# Patient Record
Sex: Male | Born: 1955
Health system: Southern US, Community
[De-identification: ages and names within clinical notes are randomized; demographics above are authoritative.]

## PROBLEM LIST (undated history)

## (undated) DIAGNOSIS — N529 Male erectile dysfunction, unspecified: Secondary | ICD-10-CM

## (undated) DIAGNOSIS — I2699 Other pulmonary embolism without acute cor pulmonale: Secondary | ICD-10-CM

## (undated) DIAGNOSIS — I4891 Unspecified atrial fibrillation: Secondary | ICD-10-CM

## (undated) DIAGNOSIS — I10 Essential (primary) hypertension: Secondary | ICD-10-CM

## (undated) DIAGNOSIS — F419 Anxiety disorder, unspecified: Secondary | ICD-10-CM

## (undated) DIAGNOSIS — I493 Ventricular premature depolarization: Secondary | ICD-10-CM

## (undated) DIAGNOSIS — I82409 Acute embolism and thrombosis of unspecified deep veins of unspecified lower extremity: Secondary | ICD-10-CM

## (undated) DIAGNOSIS — K219 Gastro-esophageal reflux disease without esophagitis: Secondary | ICD-10-CM

## (undated) HISTORY — DX: Other pulmonary embolism without acute cor pulmonale: I26.99

## (undated) HISTORY — DX: Essential (primary) hypertension: I10

## (undated) HISTORY — PX: TONSILLECTOMY: SUR1361

## (undated) HISTORY — DX: Male erectile dysfunction, unspecified: N52.9

## (undated) HISTORY — PX: VEIN SURGERY: SHX48

## (undated) HISTORY — DX: Anxiety disorder, unspecified: F41.9

## (undated) HISTORY — DX: Acute embolism and thrombosis of unspecified deep veins of unspecified lower extremity: I82.409

## (undated) HISTORY — PX: BREAST SURGERY: SHX581

## (undated) HISTORY — DX: Ventricular premature depolarization: I49.3

## (undated) HISTORY — DX: Gastro-esophageal reflux disease without esophagitis: K21.9

## (undated) HISTORY — DX: Unspecified atrial fibrillation: I48.91

---

## 1999-09-28 ENCOUNTER — Emergency Department (HOSPITAL_COMMUNITY): Admission: EM | Admit: 1999-09-28 | Discharge: 1999-09-28 | Payer: Self-pay | Admitting: Emergency Medicine

## 1999-10-02 ENCOUNTER — Inpatient Hospital Stay (HOSPITAL_COMMUNITY): Admission: EM | Admit: 1999-10-02 | Discharge: 1999-10-08 | Payer: Self-pay | Admitting: Emergency Medicine

## 1999-10-02 ENCOUNTER — Encounter: Payer: Self-pay | Admitting: Emergency Medicine

## 1999-11-23 ENCOUNTER — Ambulatory Visit (HOSPITAL_COMMUNITY): Admission: RE | Admit: 1999-11-23 | Discharge: 1999-11-23 | Payer: Self-pay | Admitting: Internal Medicine

## 1999-11-23 ENCOUNTER — Encounter: Payer: Self-pay | Admitting: Internal Medicine

## 2000-01-20 ENCOUNTER — Emergency Department (HOSPITAL_COMMUNITY): Admission: EM | Admit: 2000-01-20 | Discharge: 2000-01-20 | Payer: Self-pay | Admitting: Emergency Medicine

## 2000-03-28 ENCOUNTER — Encounter: Payer: Self-pay | Admitting: Pulmonary Disease

## 2000-03-28 ENCOUNTER — Ambulatory Visit (HOSPITAL_COMMUNITY): Admission: RE | Admit: 2000-03-28 | Discharge: 2000-03-28 | Payer: Self-pay | Admitting: Pulmonary Disease

## 2000-06-24 ENCOUNTER — Emergency Department (HOSPITAL_COMMUNITY): Admission: EM | Admit: 2000-06-24 | Discharge: 2000-06-24 | Payer: Self-pay | Admitting: Emergency Medicine

## 2000-06-24 ENCOUNTER — Encounter: Payer: Self-pay | Admitting: Emergency Medicine

## 2000-11-18 ENCOUNTER — Encounter: Payer: Self-pay | Admitting: Emergency Medicine

## 2000-11-18 ENCOUNTER — Emergency Department (HOSPITAL_COMMUNITY): Admission: EM | Admit: 2000-11-18 | Discharge: 2000-11-18 | Payer: Self-pay | Admitting: Emergency Medicine

## 2002-02-07 ENCOUNTER — Emergency Department (HOSPITAL_COMMUNITY): Admission: EM | Admit: 2002-02-07 | Discharge: 2002-02-07 | Payer: Self-pay | Admitting: Emergency Medicine

## 2002-02-07 ENCOUNTER — Encounter: Payer: Self-pay | Admitting: Emergency Medicine

## 2002-12-20 ENCOUNTER — Emergency Department (HOSPITAL_COMMUNITY): Admission: EM | Admit: 2002-12-20 | Discharge: 2002-12-20 | Payer: Self-pay | Admitting: Emergency Medicine

## 2002-12-20 ENCOUNTER — Encounter: Payer: Self-pay | Admitting: Emergency Medicine

## 2004-01-05 ENCOUNTER — Emergency Department (HOSPITAL_COMMUNITY): Admission: EM | Admit: 2004-01-05 | Discharge: 2004-01-05 | Payer: Self-pay | Admitting: Emergency Medicine

## 2004-03-29 ENCOUNTER — Ambulatory Visit: Payer: Self-pay | Admitting: Internal Medicine

## 2004-04-09 ENCOUNTER — Ambulatory Visit: Payer: Self-pay

## 2004-05-07 ENCOUNTER — Ambulatory Visit: Payer: Self-pay | Admitting: Cardiovascular Disease

## 2004-06-04 ENCOUNTER — Ambulatory Visit: Payer: Self-pay | Admitting: *Deleted

## 2004-07-02 ENCOUNTER — Ambulatory Visit: Payer: Self-pay | Admitting: Cardiology

## 2004-07-30 ENCOUNTER — Ambulatory Visit: Payer: Self-pay | Admitting: Cardiology

## 2004-08-27 ENCOUNTER — Ambulatory Visit: Payer: Self-pay | Admitting: Cardiology

## 2004-09-24 ENCOUNTER — Ambulatory Visit: Payer: Self-pay | Admitting: *Deleted

## 2004-10-16 ENCOUNTER — Ambulatory Visit: Payer: Self-pay | Admitting: *Deleted

## 2004-11-05 ENCOUNTER — Ambulatory Visit: Payer: Self-pay | Admitting: Cardiology

## 2004-11-19 ENCOUNTER — Ambulatory Visit: Payer: Self-pay | Admitting: Cardiology

## 2004-12-17 ENCOUNTER — Ambulatory Visit: Payer: Self-pay | Admitting: Cardiology

## 2004-12-24 ENCOUNTER — Ambulatory Visit: Payer: Self-pay | Admitting: Cardiology

## 2004-12-26 ENCOUNTER — Ambulatory Visit: Payer: Self-pay | Admitting: Cardiology

## 2004-12-27 ENCOUNTER — Ambulatory Visit: Payer: Self-pay | Admitting: Cardiovascular Disease

## 2004-12-27 ENCOUNTER — Observation Stay (HOSPITAL_COMMUNITY): Admission: EM | Admit: 2004-12-27 | Discharge: 2004-12-28 | Payer: Self-pay | Admitting: Emergency Medicine

## 2004-12-31 ENCOUNTER — Ambulatory Visit: Payer: Self-pay | Admitting: Cardiology

## 2005-01-08 ENCOUNTER — Ambulatory Visit: Payer: Self-pay | Admitting: Internal Medicine

## 2005-01-08 ENCOUNTER — Ambulatory Visit: Payer: Self-pay | Admitting: Cardiology

## 2005-01-28 ENCOUNTER — Ambulatory Visit: Payer: Self-pay | Admitting: Internal Medicine

## 2005-02-11 ENCOUNTER — Ambulatory Visit: Payer: Self-pay | Admitting: Internal Medicine

## 2005-03-11 ENCOUNTER — Ambulatory Visit: Payer: Self-pay | Admitting: Cardiology

## 2005-04-02 ENCOUNTER — Ambulatory Visit: Payer: Self-pay | Admitting: Internal Medicine

## 2005-04-08 ENCOUNTER — Ambulatory Visit: Payer: Self-pay | Admitting: Internal Medicine

## 2005-04-15 ENCOUNTER — Ambulatory Visit: Payer: Self-pay | Admitting: Cardiology

## 2005-04-23 ENCOUNTER — Ambulatory Visit: Payer: Self-pay | Admitting: Gastroenterology

## 2005-05-03 ENCOUNTER — Ambulatory Visit: Payer: Self-pay | Admitting: Cardiology

## 2005-05-31 ENCOUNTER — Ambulatory Visit: Payer: Self-pay | Admitting: Internal Medicine

## 2005-06-27 ENCOUNTER — Ambulatory Visit: Payer: Self-pay | Admitting: Internal Medicine

## 2005-07-25 ENCOUNTER — Ambulatory Visit: Payer: Self-pay | Admitting: Cardiology

## 2005-08-21 ENCOUNTER — Ambulatory Visit: Payer: Self-pay | Admitting: Cardiology

## 2005-09-19 ENCOUNTER — Ambulatory Visit: Payer: Self-pay | Admitting: Cardiology

## 2005-09-23 ENCOUNTER — Ambulatory Visit: Payer: Self-pay | Admitting: Internal Medicine

## 2005-10-17 ENCOUNTER — Ambulatory Visit: Payer: Self-pay | Admitting: Cardiology

## 2005-11-14 ENCOUNTER — Ambulatory Visit: Payer: Self-pay | Admitting: Internal Medicine

## 2005-12-16 ENCOUNTER — Ambulatory Visit: Payer: Self-pay | Admitting: Cardiovascular Disease

## 2005-12-30 ENCOUNTER — Ambulatory Visit: Payer: Self-pay | Admitting: Cardiovascular Disease

## 2006-01-13 ENCOUNTER — Ambulatory Visit: Payer: Self-pay | Admitting: Cardiology

## 2006-02-10 ENCOUNTER — Ambulatory Visit: Payer: Self-pay | Admitting: Cardiology

## 2006-03-08 ENCOUNTER — Emergency Department (HOSPITAL_COMMUNITY): Admission: EM | Admit: 2006-03-08 | Discharge: 2006-03-08 | Payer: Self-pay | Admitting: Emergency Medicine

## 2006-04-02 ENCOUNTER — Ambulatory Visit: Payer: Self-pay | Admitting: Cardiology

## 2006-04-02 ENCOUNTER — Ambulatory Visit: Payer: Self-pay | Admitting: Internal Medicine

## 2006-04-08 ENCOUNTER — Ambulatory Visit: Payer: Self-pay

## 2006-04-23 ENCOUNTER — Ambulatory Visit: Payer: Self-pay | Admitting: Cardiology

## 2006-05-15 ENCOUNTER — Ambulatory Visit: Payer: Self-pay | Admitting: Cardiology

## 2006-05-21 ENCOUNTER — Ambulatory Visit: Payer: Self-pay | Admitting: Internal Medicine

## 2006-05-21 LAB — CONVERTED CEMR LAB
ALT: 32 units/L (ref 0–40)
Albumin: 3.6 g/dL (ref 3.5–5.2)
Alkaline Phosphatase: 66 units/L (ref 39–117)
Basophils Relative: 0.6 % (ref 0.0–1.0)
Chloride: 104 meq/L (ref 96–112)
GFR calc non Af Amer: 75 mL/min
Glomerular Filtration Rate, Af Am: 90 mL/min/{1.73_m2}
Glucose, Bld: 102 mg/dL — ABNORMAL HIGH (ref 70–99)
Leukocytes, UA: NEGATIVE
MCV: 85.3 fL (ref 78.0–100.0)
Monocytes Absolute: 0.4 10*3/uL (ref 0.2–0.7)
Platelets: 244 10*3/uL (ref 150–400)
RBC: 4.71 M/uL (ref 4.22–5.81)
Sodium: 140 meq/L (ref 135–145)
Specific Gravity, Urine: 1.01 (ref 1.000–1.03)
Total Protein, Urine: NEGATIVE mg/dL
Total Protein: 6.9 g/dL (ref 6.0–8.3)
Urobilinogen, UA: 0.2 (ref 0.0–1.0)
VLDL: 61 mg/dL — ABNORMAL HIGH (ref 0–40)
WBC: 5.7 10*3/uL (ref 4.5–10.5)
pH: 6.5 (ref 5.0–8.0)

## 2006-06-05 ENCOUNTER — Ambulatory Visit: Payer: Self-pay | Admitting: *Deleted

## 2006-07-03 ENCOUNTER — Ambulatory Visit: Payer: Self-pay | Admitting: Internal Medicine

## 2006-07-31 ENCOUNTER — Ambulatory Visit: Payer: Self-pay | Admitting: Cardiology

## 2006-08-21 ENCOUNTER — Ambulatory Visit: Payer: Self-pay | Admitting: Cardiology

## 2006-09-09 ENCOUNTER — Ambulatory Visit: Payer: Self-pay | Admitting: Internal Medicine

## 2006-09-09 ENCOUNTER — Emergency Department (HOSPITAL_COMMUNITY): Admission: EM | Admit: 2006-09-09 | Discharge: 2006-09-09 | Payer: Self-pay | Admitting: Emergency Medicine

## 2006-09-11 ENCOUNTER — Ambulatory Visit: Payer: Self-pay | Admitting: Internal Medicine

## 2006-09-18 ENCOUNTER — Ambulatory Visit: Payer: Self-pay | Admitting: Cardiovascular Disease

## 2006-10-16 ENCOUNTER — Ambulatory Visit: Payer: Self-pay | Admitting: *Deleted

## 2006-11-11 ENCOUNTER — Ambulatory Visit: Payer: Self-pay | Admitting: Cardiology

## 2006-12-09 ENCOUNTER — Ambulatory Visit: Payer: Self-pay | Admitting: Internal Medicine

## 2007-01-05 ENCOUNTER — Ambulatory Visit: Payer: Self-pay | Admitting: Cardiology

## 2007-02-04 ENCOUNTER — Ambulatory Visit: Payer: Self-pay | Admitting: Internal Medicine

## 2007-03-03 ENCOUNTER — Ambulatory Visit: Payer: Self-pay | Admitting: Cardiology

## 2007-03-31 ENCOUNTER — Ambulatory Visit: Payer: Self-pay | Admitting: Cardiovascular Disease

## 2007-04-20 ENCOUNTER — Ambulatory Visit: Payer: Self-pay | Admitting: Internal Medicine

## 2007-04-20 DIAGNOSIS — I4891 Unspecified atrial fibrillation: Secondary | ICD-10-CM

## 2007-04-20 DIAGNOSIS — I10 Essential (primary) hypertension: Secondary | ICD-10-CM | POA: Insufficient documentation

## 2007-04-20 DIAGNOSIS — I2699 Other pulmonary embolism without acute cor pulmonale: Secondary | ICD-10-CM

## 2007-04-20 DIAGNOSIS — F418 Other specified anxiety disorders: Secondary | ICD-10-CM | POA: Insufficient documentation

## 2007-04-20 DIAGNOSIS — Z86718 Personal history of other venous thrombosis and embolism: Secondary | ICD-10-CM

## 2007-04-20 DIAGNOSIS — R079 Chest pain, unspecified: Secondary | ICD-10-CM | POA: Insufficient documentation

## 2007-04-20 DIAGNOSIS — K219 Gastro-esophageal reflux disease without esophagitis: Secondary | ICD-10-CM

## 2007-04-20 DIAGNOSIS — I4949 Other premature depolarization: Secondary | ICD-10-CM

## 2007-04-20 DIAGNOSIS — F411 Generalized anxiety disorder: Secondary | ICD-10-CM

## 2007-04-20 DIAGNOSIS — Z8672 Personal history of thrombophlebitis: Secondary | ICD-10-CM

## 2007-04-20 HISTORY — DX: Generalized anxiety disorder: F41.1

## 2007-04-28 ENCOUNTER — Ambulatory Visit: Payer: Self-pay | Admitting: Cardiology

## 2007-05-25 ENCOUNTER — Ambulatory Visit: Payer: Self-pay | Admitting: Cardiology

## 2007-06-18 ENCOUNTER — Ambulatory Visit: Payer: Self-pay | Admitting: Cardiology

## 2007-07-16 ENCOUNTER — Ambulatory Visit: Payer: Self-pay | Admitting: Cardiology

## 2007-08-10 ENCOUNTER — Ambulatory Visit: Payer: Self-pay | Admitting: Cardiology

## 2007-09-07 ENCOUNTER — Ambulatory Visit: Payer: Self-pay | Admitting: Cardiology

## 2007-10-06 ENCOUNTER — Ambulatory Visit: Payer: Self-pay | Admitting: Cardiology

## 2007-11-02 ENCOUNTER — Ambulatory Visit: Payer: Self-pay | Admitting: Internal Medicine

## 2007-11-27 ENCOUNTER — Ambulatory Visit: Payer: Self-pay | Admitting: Cardiology

## 2007-12-24 ENCOUNTER — Ambulatory Visit: Payer: Self-pay | Admitting: Internal Medicine

## 2008-01-21 ENCOUNTER — Ambulatory Visit: Payer: Self-pay | Admitting: Internal Medicine

## 2008-02-18 ENCOUNTER — Ambulatory Visit: Payer: Self-pay | Admitting: Internal Medicine

## 2008-02-29 ENCOUNTER — Emergency Department (HOSPITAL_COMMUNITY): Admission: EM | Admit: 2008-02-29 | Discharge: 2008-02-29 | Payer: Self-pay | Admitting: Emergency Medicine

## 2008-03-07 ENCOUNTER — Ambulatory Visit: Payer: Self-pay | Admitting: Internal Medicine

## 2008-03-07 LAB — CONVERTED CEMR LAB
ALT: 37 units/L (ref 0–53)
AST: 28 units/L (ref 0–37)
Basophils Relative: 0.1 % (ref 0.0–3.0)
Bilirubin, Direct: 0.2 mg/dL (ref 0.0–0.3)
CO2: 30 meq/L (ref 19–32)
Calcium: 9.1 mg/dL (ref 8.4–10.5)
Chloride: 105 meq/L (ref 96–112)
Cholesterol: 207 mg/dL (ref 0–200)
Creatinine, Ser: 1.1 mg/dL (ref 0.4–1.5)
Direct LDL: 95.5 mg/dL
Eosinophils Relative: 3.6 % (ref 0.0–5.0)
Glucose, Bld: 101 mg/dL — ABNORMAL HIGH (ref 70–99)
Hemoglobin: 14.1 g/dL (ref 13.0–17.0)
Ketones, ur: NEGATIVE mg/dL
Leukocytes, UA: NEGATIVE
Lymphocytes Relative: 33.4 % (ref 12.0–46.0)
Monocytes Relative: 5.9 % (ref 3.0–12.0)
Neutro Abs: 2.7 10*3/uL (ref 1.4–7.7)
Nitrite: NEGATIVE
PSA: 1.15 ng/mL (ref 0.10–4.00)
RBC: 4.57 M/uL (ref 4.22–5.81)
Specific Gravity, Urine: <=1.005 (ref 1.000–1.03)
Total CHOL/HDL Ratio: 6.5
Total Protein: 6.6 g/dL (ref 6.0–8.3)
WBC: 4.8 10*3/uL (ref 4.5–10.5)
pH: 7 (ref 5.0–8.0)

## 2008-03-11 ENCOUNTER — Ambulatory Visit: Payer: Self-pay | Admitting: Internal Medicine

## 2008-03-17 ENCOUNTER — Ambulatory Visit: Payer: Self-pay | Admitting: Cardiovascular Disease

## 2008-04-11 ENCOUNTER — Ambulatory Visit: Payer: Self-pay | Admitting: Internal Medicine

## 2008-05-03 ENCOUNTER — Ambulatory Visit: Payer: Self-pay | Admitting: Cardiology

## 2008-05-03 ENCOUNTER — Observation Stay (HOSPITAL_COMMUNITY): Admission: EM | Admit: 2008-05-03 | Discharge: 2008-05-04 | Payer: Self-pay | Admitting: Cardiovascular Disease

## 2008-05-03 ENCOUNTER — Ambulatory Visit: Payer: Self-pay | Admitting: Diagnostic Radiology

## 2008-05-03 ENCOUNTER — Encounter: Payer: Self-pay | Admitting: Emergency Medicine

## 2008-05-09 ENCOUNTER — Ambulatory Visit: Payer: Self-pay

## 2008-05-09 ENCOUNTER — Ambulatory Visit: Payer: Self-pay | Admitting: Cardiology

## 2008-06-06 ENCOUNTER — Ambulatory Visit: Payer: Self-pay | Admitting: Cardiology

## 2008-07-04 ENCOUNTER — Ambulatory Visit: Payer: Self-pay | Admitting: Internal Medicine

## 2008-08-01 ENCOUNTER — Ambulatory Visit: Payer: Self-pay | Admitting: Cardiology

## 2008-08-29 ENCOUNTER — Ambulatory Visit: Payer: Self-pay | Admitting: Internal Medicine

## 2008-09-26 ENCOUNTER — Ambulatory Visit: Payer: Self-pay | Admitting: Cardiovascular Disease

## 2008-10-18 ENCOUNTER — Encounter: Payer: Self-pay | Admitting: *Deleted

## 2008-10-24 ENCOUNTER — Ambulatory Visit: Payer: Self-pay | Admitting: Cardiovascular Disease

## 2008-11-14 ENCOUNTER — Ambulatory Visit: Payer: Self-pay | Admitting: Cardiology

## 2008-11-14 LAB — CONVERTED CEMR LAB: Prothrombin Time: 20.5 s

## 2008-11-23 ENCOUNTER — Encounter: Payer: Self-pay | Admitting: *Deleted

## 2008-12-13 ENCOUNTER — Ambulatory Visit: Payer: Self-pay | Admitting: Internal Medicine

## 2008-12-13 LAB — CONVERTED CEMR LAB
POC INR: 2.8
Prothrombin Time: 20.1 s

## 2009-01-09 ENCOUNTER — Ambulatory Visit: Payer: Self-pay | Admitting: Cardiovascular Disease

## 2009-01-09 LAB — CONVERTED CEMR LAB: POC INR: 3.4

## 2009-01-31 ENCOUNTER — Ambulatory Visit: Payer: Self-pay | Admitting: Cardiology

## 2009-02-13 ENCOUNTER — Ambulatory Visit: Payer: Self-pay | Admitting: Cardiology

## 2009-03-13 ENCOUNTER — Ambulatory Visit: Payer: Self-pay | Admitting: Cardiovascular Disease

## 2009-03-13 LAB — CONVERTED CEMR LAB: POC INR: 3

## 2009-03-28 ENCOUNTER — Telehealth: Payer: Self-pay | Admitting: Internal Medicine

## 2009-03-29 ENCOUNTER — Ambulatory Visit: Payer: Self-pay | Admitting: Internal Medicine

## 2009-03-29 DIAGNOSIS — M79609 Pain in unspecified limb: Secondary | ICD-10-CM | POA: Insufficient documentation

## 2009-03-29 DIAGNOSIS — J019 Acute sinusitis, unspecified: Secondary | ICD-10-CM

## 2009-04-10 ENCOUNTER — Ambulatory Visit: Payer: Self-pay

## 2009-04-10 ENCOUNTER — Encounter: Payer: Self-pay | Admitting: Internal Medicine

## 2009-04-10 ENCOUNTER — Ambulatory Visit: Payer: Self-pay | Admitting: Internal Medicine

## 2009-04-14 ENCOUNTER — Ambulatory Visit: Payer: Self-pay | Admitting: Internal Medicine

## 2009-05-01 ENCOUNTER — Ambulatory Visit: Payer: Self-pay | Admitting: Cardiovascular Disease

## 2009-05-01 LAB — CONVERTED CEMR LAB
INR: 2.7
POC INR: 2.7

## 2009-05-29 ENCOUNTER — Ambulatory Visit: Payer: Self-pay | Admitting: Internal Medicine

## 2009-06-26 ENCOUNTER — Ambulatory Visit: Payer: Self-pay | Admitting: Cardiovascular Disease

## 2009-07-24 ENCOUNTER — Ambulatory Visit: Payer: Self-pay | Admitting: Internal Medicine

## 2009-08-21 ENCOUNTER — Ambulatory Visit: Payer: Self-pay | Admitting: Cardiovascular Disease

## 2009-09-18 ENCOUNTER — Ambulatory Visit: Payer: Self-pay | Admitting: Cardiology

## 2009-09-18 LAB — CONVERTED CEMR LAB: POC INR: 2.6

## 2009-10-19 ENCOUNTER — Ambulatory Visit: Payer: Self-pay | Admitting: Internal Medicine

## 2009-11-07 ENCOUNTER — Emergency Department (HOSPITAL_COMMUNITY): Admission: EM | Admit: 2009-11-07 | Discharge: 2009-11-07 | Payer: Self-pay | Admitting: Emergency Medicine

## 2009-11-13 ENCOUNTER — Ambulatory Visit: Payer: Self-pay | Admitting: Cardiology

## 2009-12-11 ENCOUNTER — Ambulatory Visit: Payer: Self-pay | Admitting: Cardiology

## 2010-01-08 ENCOUNTER — Ambulatory Visit: Payer: Self-pay | Admitting: Cardiology

## 2010-01-08 LAB — CONVERTED CEMR LAB: POC INR: 2.6

## 2010-02-05 ENCOUNTER — Ambulatory Visit: Payer: Self-pay | Admitting: Cardiovascular Disease

## 2010-02-05 LAB — CONVERTED CEMR LAB: POC INR: 2.3

## 2010-03-02 ENCOUNTER — Ambulatory Visit: Payer: Self-pay | Admitting: Cardiology

## 2010-03-22 ENCOUNTER — Telehealth: Payer: Self-pay | Admitting: Internal Medicine

## 2010-03-22 ENCOUNTER — Ambulatory Visit: Payer: Self-pay

## 2010-03-22 ENCOUNTER — Ambulatory Visit: Payer: Self-pay | Admitting: Internal Medicine

## 2010-03-22 DIAGNOSIS — M7989 Other specified soft tissue disorders: Secondary | ICD-10-CM

## 2010-03-26 ENCOUNTER — Encounter (INDEPENDENT_AMBULATORY_CARE_PROVIDER_SITE_OTHER): Payer: Self-pay | Admitting: *Deleted

## 2010-03-26 ENCOUNTER — Telehealth: Payer: Self-pay | Admitting: Internal Medicine

## 2010-04-02 ENCOUNTER — Ambulatory Visit: Payer: Self-pay | Admitting: Internal Medicine

## 2010-04-04 ENCOUNTER — Telehealth: Payer: Self-pay | Admitting: Internal Medicine

## 2010-04-30 ENCOUNTER — Ambulatory Visit: Payer: Self-pay | Admitting: Cardiology

## 2010-04-30 LAB — CONVERTED CEMR LAB: POC INR: 2.2

## 2010-06-04 ENCOUNTER — Ambulatory Visit: Admission: RE | Admit: 2010-06-04 | Discharge: 2010-06-04 | Payer: Self-pay | Source: Home / Self Care

## 2010-06-17 LAB — CONVERTED CEMR LAB
ALT: 32 units/L (ref 0–53)
AST: 27 units/L (ref 0–37)
Albumin: 3.6 g/dL (ref 3.5–5.2)
Chloride: 103 meq/L (ref 96–112)
Direct LDL: 93 mg/dL
Eosinophils Relative: 2.8 % (ref 0.0–5.0)
GFR calc non Af Amer: 82.3 mL/min (ref >60)
Glucose, Bld: 98 mg/dL (ref 70–99)
HCT: 40.9 % (ref 39.0–52.0)
Hemoglobin, Urine: NEGATIVE
Hemoglobin: 13.9 g/dL (ref 13.0–17.0)
Lymphs Abs: 1.5 10*3/uL (ref 0.7–4.0)
Monocytes Relative: 7 % (ref 3.0–12.0)
Neutro Abs: 2.7 10*3/uL (ref 1.4–7.7)
Nitrite: NEGATIVE
Potassium: 4.2 meq/L (ref 3.5–5.1)
TSH: 2.48 microintl units/mL (ref 0.35–5.50)
Total Protein, Urine: NEGATIVE mg/dL
Total Protein: 6.7 g/dL (ref 6.0–8.3)
WBC: 4.7 10*3/uL (ref 4.5–10.5)
pH: 7 (ref 5.0–8.0)

## 2010-06-21 NOTE — Medication Information (Signed)
Summary: rov/ewj  Anticoagulant Therapy  Managed by: Weston Brass, PharmD Referring MD: Dietrich Pates PCP: Corwin Levins MD Supervising MD: Antoine Poche MD, Fayrene Fearing Indication 1: Pulmonary embolus (ICD-415.19) Lab Used: [facility] Site: [facility] INR POC 2.6 INR RANGE 2 - 3  Dietary changes: no    Health status changes: no    Bleeding/hemorrhagic complications: no    Recent/future hospitalizations: no    Any changes in medication regimen? no    Recent/future dental: no  Any missed doses?: no       Is patient compliant with meds? yes       Allergies: No Known Drug Allergies  Anticoagulation Management History:      The patient is taking warfarin and comes in today for a routine follow up visit.  Negative risk factors for bleeding include an age less than 42 years old.  The bleeding index is 'low risk'.  Positive CHADS2 values include History of HTN.  Negative CHADS2 values include Age > 31 years old.  The start date was 10/02/1999.  His last INR was 2.7.  Anticoagulation responsible provider: Antoine Poche MD, Fayrene Fearing.  INR POC: 2.6.  Cuvette Lot#: [id number].  Exp: 02/2011.    Anticoagulation Management Assessment/Plan:      The patient's current anticoagulation dose is Coumadin 4 mg tabs: Take as directed by coumadin clinic..  The target INR is 2 - 3.  The next INR is due 02/05/2010.  Anticoagulation instructions were given to patient.  Results were reviewed/authorized by Weston Brass, PharmD.  He was notified by Gweneth Fritter, PharmD Candidate.         Prior Anticoagulation Instructions: INR 2.4  Continue on same dosage2 tablets daily except 1.5 tablets on Mondays and Thursdays.  Recheck in 4 weeks.   Current Anticoagulation Instructions: INR 2.6  Continue taking 2 tablets (8mg ) every day except take 1.5 tablets (6mg ) on Mondays and Thursdays.  Recheck in 4 weeks.

## 2010-06-21 NOTE — Medication Information (Signed)
Summary: rov/mlw  Anticoagulant Therapy  Managed by: Bethena Midget, RN, BSN Referring MD: Dietrich Pates PCP: Corwin Levins MD Supervising MD: Juanda Chance MD, Randall Colden Indication 1: Pulmonary embolus (ICD-415.19) Lab Used: [facility] Site: [facility] INR POC 2.6 INR RANGE 2 - 3  Dietary changes: no    Health status changes: no    Bleeding/hemorrhagic complications: no    Recent/future hospitalizations: no    Any changes in medication regimen? no    Recent/future dental: no  Any missed doses?: no       Is patient compliant with meds? yes       Allergies: No Known Drug Allergies  Anticoagulation Management History:      The patient is taking warfarin and comes in today for a routine follow up visit.  Negative risk factors for bleeding include an age less than 38 years old.  The bleeding index is 'low risk'.  Positive CHADS2 values include History of HTN.  Negative CHADS2 values include Age > 40 years old.  The start date was 10/02/1999.  His last INR was 2.7.  Anticoagulation responsible provider: Juanda Chance MD, Smitty Cords.  INR POC: 2.6.  Cuvette Lot#: [id number].  Exp: 10/2010.    Anticoagulation Management Assessment/Plan:      The patient's current anticoagulation dose is Coumadin 4 mg tabs: Take as directed by coumadin clinic..  The target INR is 2 - 3.  The next INR is due 10/19/2009.  Anticoagulation instructions were given to patient.  Results were reviewed/authorized by Bethena Midget, RN, BSN.  He was notified by Bethena Midget, RN, BSN.         Prior Anticoagulation Instructions: INR 2.2  The patient is to continue with the same dose of coumadin.  This dosage includes: 2 tab daily (8 mg), except 1.5 tab (6 mg) on Mondays.   Recheck in 4 weeks. Monday, May 2nd at 4:15 pm.   Current Anticoagulation Instructions: INR 2.6 Continue 8mg s daily except 6mg s on Mondays. Recheck in 4 weeks.

## 2010-06-21 NOTE — Medication Information (Signed)
Summary: rov/jk  Anticoagulant Therapy  Managed by: Weston Brass, PharmD Referring MD: Dietrich Pates PCP: Corwin Levins MD Supervising MD: Excell Seltzer MD, Casimiro Needle Indication 1: Pulmonary embolus (ICD-415.19) Lab Used: [facility] Site: [facility] INR POC 2.3 INR RANGE 2 - 3  Dietary changes: no    Health status changes: no    Bleeding/hemorrhagic complications: no    Recent/future hospitalizations: no    Any changes in medication regimen? no    Recent/future dental: no  Any missed doses?: no       Is patient compliant with meds? yes       Allergies: No Known Drug Allergies  Anticoagulation Management History:      The patient is taking warfarin and comes in today for a routine follow up visit.  Negative risk factors for bleeding include an age less than 57 years old.  The bleeding index is 'low risk'.  Positive CHADS2 values include History of HTN.  Negative CHADS2 values include Age > 87 years old.  The start date was 10/02/1999.  His last INR was 2.7.  Anticoagulation responsible provider: Excell Seltzer MD, Casimiro Needle.  INR POC: 2.3.  Cuvette Lot#: [id number].  Exp: 03/2011.    Anticoagulation Management Assessment/Plan:      The patient's current anticoagulation dose is Coumadin 4 mg tabs: Take as directed by coumadin clinic..  The target INR is 2 - 3.  The next INR is due 03/02/2010.  Anticoagulation instructions were given to patient.  Results were reviewed/authorized by Weston Brass, PharmD.  He was notified by Harrel Carina, PharmD candidate.         Prior Anticoagulation Instructions: INR 2.6  Continue taking 2 tablets (8mg ) every day except take 1.5 tablets (6mg ) on Mondays and Thursdays.  Recheck in 4 weeks.   Current Anticoagulation Instructions: INR 2.3  Continue taking 2 tablets everyday except take 1 1/2 tablets on Mondays and Thursdays. Re-check INR in 4 weeks.

## 2010-06-21 NOTE — Medication Information (Signed)
Summary: ROV/JM      Allergies Added: NKDA Anticoagulant Therapy  Managed by: Loma Newton, PharmD Referring MD: Dietrich Pates PCP: Corwin Levins MD Supervising MD: Ladona Ridgel MD, Sharlot Gowda Indication 1: Pulmonary embolus (ICD-415.19) Lab Used: [facility] Site: [facility] INR POC 2.7 INR RANGE 2 - 3  Dietary changes: no    Health status changes: yes       Details: had a cold  Bleeding/hemorrhagic complications: no    Recent/future hospitalizations: no    Any changes in medication regimen? no    Recent/future dental: no  Any missed doses?: no       Is patient compliant with meds? yes       Current Medications (verified): 1)  Coumadin 4 Mg Tabs (Warfarin Sodium) .... Take As Directed By Coumadin Clinic. 2)  Toprol Xl 25 Mg  Tb24 (Metoprolol Succinate) .Marland Kitchen.. 1 Tab Once Daily  Allergies (verified): No Known Drug Allergies  Anticoagulation Management History:      The patient is taking warfarin and comes in today for a routine follow up visit.  Negative risk factors for bleeding include an age less than 50 years old.  The bleeding index is 'low risk'.  Positive CHADS2 values include History of HTN.  Negative CHADS2 values include Age > 57 years old.  The start date was 10/02/1999.  His last INR was 2.7.  Anticoagulation responsible provider: Ladona Ridgel MD, Sharlot Gowda.  INR POC: 2.7.  Cuvette Lot#: [id number].  Exp: 10/17/2010.    Anticoagulation Management Assessment/Plan:      The patient's current anticoagulation dose is Coumadin 4 mg tabs: Take as directed by coumadin clinic..  The target INR is 2 - 3.  The next INR is due 08/21/2009.  Anticoagulation instructions were given to patient.  Results were reviewed/authorized by Loma Newton, PharmD.  He was notified by Loma Newton.         Prior Anticoagulation Instructions: INR 2.4  CONTINUE TO TAKE 2 TABS EVERYDAY EXCEPT TAKE 1.5 TABS ON MONDAY.  RECHECK IN 4 WEEKS.  Current Anticoagulation Instructions: INR = 2.7  The patient  is to continue with the same dose of coumadin.  This dosage includes: 2 tablets every day except for mondays take 1.5 tablets

## 2010-06-21 NOTE — Medication Information (Signed)
Summary: rov/tp   Anticoagulant Therapy  Managed by: Geoffry Paradise, PharmD Referring MD: Dietrich Pates PCP: Corwin Levins MD Supervising MD: Daleen Squibb MD, Maisie Fus Indication 1: Pulmonary embolus (ICD-415.19) Lab Used: [facility] Site: [facility] INR RANGE 2 - 3  Dietary changes: no    Health status changes: no    Bleeding/hemorrhagic complications: no    Recent/future hospitalizations: no    Any changes in medication regimen? no    Recent/future dental: no  Any missed doses?: no       Is patient compliant with meds? yes       Allergies: No Known Drug Allergies  Anticoagulation Management History:      Negative risk factors for bleeding include an age less than 10 years old.  The bleeding index is 'low risk'.  Positive CHADS2 values include History of HTN.  Negative CHADS2 values include Age > 61 years old.  The start date was 10/02/1999.  His last INR was 2.7.  Anticoagulation responsible provider: Daleen Squibb MD, Maisie Fus.  Exp: 04/2011.    Anticoagulation Management Assessment/Plan:      The patient's current anticoagulation dose is Coumadin 4 mg tabs: Take as directed by coumadin clinic..  The target INR is 2 - 3.  The next INR is due 07/02/2010.  Anticoagulation instructions were given to patient.  Results were reviewed/authorized by Geoffry Paradise, PharmD.         Prior Anticoagulation Instructions: INR = 2.2  Continue taking coumadin as scheduled with 2 tablets (8 mg) daily except for 1 1/2 tablets (6 mg) on Tuesdays and Fridays.  Current Anticoagulation Instructions: INR:  2.2 (goal 2-3)  Your INR is at goal today.  Continue taking your coumadin 6 mg on Tuesday, Friday and 8 mg other days of the week.  Return to clinic in 4 weeks for another INR check.

## 2010-06-21 NOTE — Medication Information (Signed)
Summary: rov/jaj  Anticoagulant Therapy  Managed by: Cloyde Reams, RN, BSN Referring MD: Dietrich Pates PCP: Corwin Levins MD Supervising MD: Riley Kill MD, Maisie Fus Indication 1: Pulmonary embolus (ICD-415.19) Lab Used: [facility] Site: [facility] INR POC 2.4 INR RANGE 2 - 3  Dietary changes: no    Health status changes: no    Bleeding/hemorrhagic complications: no    Recent/future hospitalizations: no    Any changes in medication regimen? no    Recent/future dental: no  Any missed doses?: no       Is patient compliant with meds? yes       Allergies: No Known Drug Allergies  Anticoagulation Management History:      The patient is taking warfarin and comes in today for a routine follow up visit.  Negative risk factors for bleeding include an age less than 62 years old.  The bleeding index is 'low risk'.  Positive CHADS2 values include History of HTN.  Negative CHADS2 values include Age > 5 years old.  The start date was 10/02/1999.  His last INR was 2.7.  Anticoagulation responsible Blima Jaimes: Riley Kill MD, Maisie Fus.  INR POC: 2.4.  Cuvette Lot#: [id number].  Exp: 03/2011.    Anticoagulation Management Assessment/Plan:      The patient's current anticoagulation dose is Coumadin 4 mg tabs: Take as directed by coumadin clinic..  The target INR is 2 - 3.  The next INR is due 04/02/2010.  Anticoagulation instructions were given to patient.  Results were reviewed/authorized by Cloyde Reams, RN, BSN.  He was notified by Cloyde Reams RN.         Prior Anticoagulation Instructions: INR 2.3  Continue taking 2 tablets everyday except take 1 1/2 tablets on Mondays and Thursdays. Re-check INR in 4 weeks.   Current Anticoagulation Instructions: INR 2.4  Continue on same dosage 2 tablets daily except 1.5 tablets on Mondays and Thursdays.  Recheck in 4 weeks.

## 2010-06-21 NOTE — Assessment & Plan Note (Signed)
Summary: yearly/sl      Allergies Added: NKDA  Primary Provider:  Corwin Levins MD   History of Present Illness: Mr. Wien returns today for followup  He is a 55 yo man with PAF, h/o severe pulmonary emboli, chronic coumadin therapy.  He is obese.  He denies plapitation or sob.  Minimal peripheral edema.  His rare episodes of  c/p are not associated with n/v/sob/or diaphoresis. He has no other complaints.  Current Medications (verified): 1)  Coumadin 4 Mg Tabs (Warfarin Sodium) .... Take As Directed By Coumadin Clinic. 2)  Toprol Xl 25 Mg  Tb24 (Metoprolol Succinate) .Marland Kitchen.. 1 Tab Once Daily  Allergies (verified): No Known Drug Allergies  Past History:  Past Medical History: Last updated: 03/11/2008 DVT, hx of - 2000 pulmonary embolus - 2000 Atrial fibrillation post PE only GERD Hypertension Anxiety symptomatic PVC's  Review of Systems  The patient denies chest pain, syncope, dyspnea on exertion, and peripheral edema.    Vital Signs:  Patient profile:   56 year old male Height:      74 inches Weight:      292 pounds BMI:     37.63 Pulse rate:   67 / minute BP sitting:   140 / 96  (left arm)  Vitals Entered By: Laurance Flatten CMA (April 02, 2010 3:56 PM)  Physical Exam  General:  alert and overweight-appearing.   Head:  normocephalic and atraumatic.   Eyes:  vision grossly intact, pupils equal, and pupils round.   Mouth:  no gingival abnormalities and pharynx pink and moist.   Neck:  supple and no masses.   Lungs:  normal respiratory effort and normal breath sounds.   Heart:  normal rate and regular rhythm.   Abdomen:  soft, non-tender, and normal bowel sounds.   Msk:  no joint tenderness and no joint swelling.   Pulses:  2+ and symmetric. Extremities:  trace peripheral edema. Neurologic:  Alert and oriented x 3.   EKG  Procedure date:  04/02/2010  Findings:      Normal sinus rhythm with rate of:  67.  Impression & Recommendations:  Problem # 1:   ATRIAL FIBRILLATION (ICD-427.31) He has had no recurrent symptoms.  I have asked him to continue his current meds. His updated medication list for this problem includes:    Coumadin 4 Mg Tabs (Warfarin sodium) .Marland Kitchen... Take as directed by coumadin clinic.    Toprol Xl 25 Mg Tb24 (Metoprolol succinate) .Marland Kitchen... 1 tab once daily  Problem # 2:  HYPERTENSION (ICD-401.9) His blood pressure appears to be well controlled. He will maintain a low sodium diet. His updated medication list for this problem includes:    Toprol Xl 25 Mg Tb24 (Metoprolol succinate) .Marland Kitchen... 1 tab once daily  Patient Instructions: 1)  Your physician recommends that you continue on your current medications as directed. Please refer to the Current Medication list given to you today. 2)  Your physician wants you to follow-up in: 2 years.   You will receive a reminder letter in the mail two months in advance. If you don't receive a letter, please call our office to schedule the follow-up appointment. Prescriptions: TOPROL XL 25 MG  TB24 (METOPROLOL SUCCINATE) 1 tab once daily  #90 x 4   Entered by:   Laurance Flatten CMA   Authorized by:   Laren Boom, MD, [facility]   Signed by:   Laurance Flatten CMA on 04/02/2010   Method used:   Electronically to  [facility] [id number]* (retail)       [address] [place], [place]  [address]       Ph: [phone]       Fax: [phone]   RxID:   [id number]

## 2010-06-21 NOTE — Medication Information (Signed)
Summary: rov/mb      Allergies Added: NKDA Anticoagulant Therapy  Managed by: Lynann Bologna, PharmD Referring MD: Dietrich Pates PCP: Corwin Levins MD Supervising MD: Clifton James MD, Cristal Deer Indication 1: Pulmonary embolus (ICD-415.19) Lab Used: [facility] Site: [facility] INR POC 2.2 INR RANGE 2 - 3  Dietary changes: no    Health status changes: no    Bleeding/hemorrhagic complications: no    Recent/future hospitalizations: no    Any changes in medication regimen? no    Recent/future dental: no  Any missed doses?: no       Is patient compliant with meds? yes       Current Medications (verified): 1)  Coumadin 4 Mg Tabs (Warfarin Sodium) .... Take As Directed By Coumadin Clinic. 2)  Toprol Xl 25 Mg  Tb24 (Metoprolol Succinate) .Marland Kitchen.. 1 Tab Once Daily  Allergies (verified): No Known Drug Allergies  Anticoagulation Management History:      The patient is taking warfarin and comes in today for a routine follow up visit.  Negative risk factors for bleeding include an age less than 34 years old.  The bleeding index is 'low risk'.  Positive CHADS2 values include History of HTN.  Negative CHADS2 values include Age > 58 years old.  The start date was 10/02/1999.  His last INR was 2.7.  Anticoagulation responsible provider: Clifton James MD, Cristal Deer.  INR POC: 2.2.  Cuvette Lot#: [id number].  Exp: 10/17/2010.    Anticoagulation Management Assessment/Plan:      The patient's current anticoagulation dose is Coumadin 4 mg tabs: Take as directed by coumadin clinic..  The target INR is 2 - 3.  The next INR is due 09/18/2009.  Anticoagulation instructions were given to patient.  Results were reviewed/authorized by Lynann Bologna, PharmD.  He was notified by Lynann Bologna.         Prior Anticoagulation Instructions: INR = 2.7  The patient is to continue with the same dose of coumadin.  This dosage includes: 2 tablets every day except for mondays take 1.5 tablets  Current Anticoagulation  Instructions: INR 2.2  The patient is to continue with the same dose of coumadin.  This dosage includes: 2 tab daily (8 mg), except 1.5 tab (6 mg) on Mondays.   Recheck in 4 weeks. Monday, May 2nd at 4:15 pm.

## 2010-06-21 NOTE — Letter (Signed)
Summary: Generic Letter  [facility]  [address] [place], [place] [address]   Phone: [phone]  Fax: [phone]    03/26/2010  Aasim Freid [address] [place], [place]  [address]  To Whom it may concern,  This is to inform that Asser Lucena can return to work today (03/26/2010). Please call our office if you have further questions.           Sincerely,   Dr. Oliver Barre

## 2010-06-21 NOTE — Miscellaneous (Signed)
Summary: Orders Update  Clinical Lists Changes  Orders: Added new Test order of Venous Duplex Lower Extremity (Venous Duplex Lower) - Signed 

## 2010-06-21 NOTE — Medication Information (Signed)
Summary: Justin Woodard  Anticoagulant Therapy  Managed by: Cloyde Reams, RN, BSN Referring MD: Dietrich Pates PCP: Corwin Levins MD Supervising MD: Riley Kill MD, Maisie Fus Indication 1: Pulmonary embolus (ICD-415.19) Lab Used: [facility] Site: [facility] INR POC 2.4 INR RANGE 2 - 3  Dietary changes: no    Health status changes: no    Bleeding/hemorrhagic complications: yes       Details: Blood vessel burst in R eye saw opthamologist.   Recent/future hospitalizations: no    Any changes in medication regimen? no    Recent/future dental: no  Any missed doses?: no       Is patient compliant with meds? yes       Allergies: No Known Drug Allergies  Anticoagulation Management History:      The patient is taking warfarin and comes in today for a routine follow up visit.  Negative risk factors for bleeding include an age less than 19 years old.  The bleeding index is 'low risk'.  Positive CHADS2 values include History of HTN.  Negative CHADS2 values include Age > 2 years old.  The start date was 10/02/1999.  His last INR was 2.7.  Anticoagulation responsible provider: Riley Kill MD, Maisie Fus.  INR POC: 2.4.  Cuvette Lot#: [id number].  Exp: 02/2011.    Anticoagulation Management Assessment/Plan:      The patient's current anticoagulation dose is Coumadin 4 mg tabs: Take as directed by coumadin clinic..  The target INR is 2 - 3.  The next INR is due 01/08/2010.  Anticoagulation instructions were given to patient.  Results were reviewed/authorized by Cloyde Reams, RN, BSN.  He was notified by Cloyde Reams RN.         Prior Anticoagulation Instructions: INR 3.1  Decreasing the dose to 1.5 tablets on Monday's and Thursday's, 2 tablets all other days.  Return in 4 weeks.  Current Anticoagulation Instructions: INR 2.4  Continue on same dosage2 tablets daily except 1.5 tablets on Mondays and Thursdays.  Recheck in 4 weeks.

## 2010-06-21 NOTE — Medication Information (Signed)
      Allergies Added: NKDA Anticoagulant Therapy  Managed by: Rolland Porter, PharmD Referring MD: Dietrich Pates PCP: Corwin Levins MD Supervising MD: Jens Som MD, Arlys John Indication 1: Pulmonary embolus (ICD-415.19) Lab Used: [facility]  Site: [facility] INR POC 3.1 INR RANGE 2 - 3  Dietary changes: no    Health status changes: no    Bleeding/hemorrhagic complications: no    Recent/future hospitalizations: no    Any changes in medication regimen? no    Recent/future dental: no  Any missed doses?: no       Is patient compliant with meds? yes       Current Medications (verified): 1)  Coumadin 4 Mg Tabs (Warfarin Sodium) .... Take As Directed By Coumadin Clinic. 2)  Toprol Xl 25 Mg  Tb24 (Metoprolol Succinate) .Marland Kitchen.. 1 Tab Once Daily  Allergies (verified): No Known Drug Allergies  Anticoagulation Management History:      The patient is taking warfarin and comes in today for a routine follow up visit.  Negative risk factors for bleeding include an age less than 31 years old.  The bleeding index is 'low risk'.  Positive CHADS2 values include History of HTN.  Negative CHADS2 values include Age > 70 years old.  The start date was 10/02/1999.  His last INR was 2.7.  Anticoagulation responsible provider: Jens Som MD, Arlys John.  INR POC: 3.1.  Cuvette Lot#: [id number].  Exp: 12/2010.    Anticoagulation Management Assessment/Plan:      The patient's current anticoagulation dose is Coumadin 4 mg tabs: Take as directed by coumadin clinic..  The target INR is 2 - 3.  The next INR is due 12/11/2009.  Anticoagulation instructions were given to patient.  Results were reviewed/authorized by Rolland Porter, PharmD.  He was notified by Rolland Porter.         Prior Anticoagulation Instructions: INR-2.3 Resume normal dosing schedule. Take take 1.5 tablets on Monday and take 2 tablets on all other days. Return in 4 weeks  Current Anticoagulation Instructions: INR 3.1  Decreasing the dose to 1.5 tablets  on Monday's and Thursday's, 2 tablets all other days.  Return in 4 weeks.

## 2010-06-21 NOTE — Medication Information (Signed)
Summary: Justin Woodard  Anticoagulant Therapy  Managed by: Cloyde Reams, RN, BSN Referring MD: Dietrich Pates PCP: Corwin Levins MD Supervising MD: Tenny Craw MD, Gunnar Fusi Indication 1: Pulmonary embolus (ICD-415.19) Lab Used: [facility] Site: [facility] INR RANGE 2 - 3  Dietary changes: no    Health status changes: no    Bleeding/hemorrhagic complications: no    Recent/future hospitalizations: no    Any changes in medication regimen? no    Recent/future dental: no  Any missed doses?: no       Is patient compliant with meds? yes       Allergies (verified): No Known Drug Allergies  Anticoagulation Management History:      The patient is taking warfarin and comes in today for a routine follow up visit.  Negative risk factors for bleeding include an age less than 98 years old.  The bleeding index is 'low risk'.  Positive CHADS2 values include History of HTN.  Negative CHADS2 values include Age > 5 years old.  The start date was 10/02/1999.  His last INR was 2.7.  Anticoagulation responsible provider: Tenny Craw MD, Gunnar Fusi.  Cuvette Lot#: [id number].  Exp: 06/2010.    Anticoagulation Management Assessment/Plan:      The patient's current anticoagulation dose is Coumadin 4 mg tabs: Take as directed by coumadin clinic..  The target INR is 2 - 3.  The next INR is due 06/26/2009.  Anticoagulation instructions were given to patient.  Results were reviewed/authorized by Cloyde Reams, RN, BSN.  He was notified by Cloyde Reams RN.         Prior Anticoagulation Instructions: The patient is to continue with the same dose of coumadin.  This dosage includes: continue with 2 tabs every day except monday take 1.5 tabs  Current Anticoagulation Instructions: INR 2.7  Continue on same dosage 2 tablets daily except 1.5 tablets on Mondays.   Recheck in 4 weeks.

## 2010-06-21 NOTE — Medication Information (Signed)
Summary: rov/ewj   Anticoagulant Therapy  Managed by: Darrick Penna Referring MD: Dietrich Pates PCP: Corwin Levins MD Supervising MD: Ladona Ridgel MD, Sharlot Gowda Indication 1: Pulmonary embolus (ICD-415.19) Lab Used: [facility] Site: [facility] INR POC 2.2 INR RANGE 2 - 3  Dietary changes: no    Health status changes: no    Bleeding/hemorrhagic complications: no    Recent/future hospitalizations: no    Any changes in medication regimen? no    Recent/future dental: no  Any missed doses?: no       Is patient compliant with meds? no       Allergies: No Known Drug Allergies  Anticoagulation Management History:      The patient is taking warfarin and comes in today for a routine follow up visit.  Negative risk factors for bleeding include an age less than 57 years old.  The bleeding index is 'low risk'.  Positive CHADS2 values include History of HTN.  Negative CHADS2 values include Age > 57 years old.  The start date was 10/02/1999.  His last INR was 2.7.  Anticoagulation responsible provider: Ladona Ridgel MD, Sharlot Gowda.  INR POC: 2.2.  Cuvette Lot#: [id number].  Exp: 04/2011.    Anticoagulation Management Assessment/Plan:      The patient's current anticoagulation dose is Coumadin 4 mg tabs: Take as directed by coumadin clinic..  The target INR is 2 - 3.  The next INR is due 04/30/2010.  Anticoagulation instructions were given to patient.  Results were reviewed/authorized by Darrick Penna.  He was notified by Weston Brass PharmD.         Prior Anticoagulation Instructions: INR 2.4  Continue on same dosage 2 tablets daily except 1.5 tablets on Mondays and Thursdays.  Recheck in 4 weeks.    Current Anticoagulation Instructions: INR 2.2 Continue same dosage of Sun,Tue,Wed,Fri,Sat of 2tabs and Mon and Thur of 1.5 tabs. Recheck in 4wks.

## 2010-06-21 NOTE — Medication Information (Signed)
Summary: Justin Woodard   Anticoagulant Therapy  Managed by: Earvin Hansen, PharmD Referring MD: Dietrich Pates PCP: Corwin Levins MD Supervising MD: Juanda Chance MD, Bruce Indication 1: Pulmonary embolus (ICD-415.19) Lab Used: [facility] Site: [facility] INR POC 2.2 INR RANGE 2 - 3  Dietary changes: no    Health status changes: no    Bleeding/hemorrhagic complications: no    Recent/future hospitalizations: no    Any changes in medication regimen? no    Recent/future dental: no  Any missed doses?: no       Is patient compliant with meds? yes       Allergies: No Known Drug Allergies  Anticoagulation Management History:      Negative risk factors for bleeding include an age less than 47 years old.  The bleeding index is 'low risk'.  Positive CHADS2 values include History of HTN.  Negative CHADS2 values include Age > 41 years old.  The start date was 10/02/1999.  His last INR was 2.7.  Anticoagulation responsible provider: Juanda Chance MD, Smitty Cords.  INR POC: 2.2.  Exp: 04/2011.    Anticoagulation Management Assessment/Plan:      The patient's current anticoagulation dose is Coumadin 4 mg tabs: Take as directed by coumadin clinic..  The target INR is 2 - 3.  The next INR is due 06/04/2010.  Anticoagulation instructions were given to patient.  Results were reviewed/authorized by Earvin Hansen, PharmD.  He was notified by Earvin Hansen PharmD.         Prior Anticoagulation Instructions: INR 2.2 Continue same dosage of Sun,Tue,Wed,Fri,Sat of 2tabs and Mon and Thur of 1.5 tabs. Recheck in 4wks.  Current Anticoagulation Instructions: INR = 2.2  Continue taking coumadin as scheduled with 2 tablets (8 mg) daily except for 1 1/2 tablets (6 mg) on Tuesdays and Fridays.

## 2010-06-21 NOTE — Assessment & Plan Note (Signed)
Summary: right lower calf pain,swollen/cd   Vital Signs:  Patient profile:   55 year old male Height:      75 inches Weight:      299.38 pounds BMI:     37.56 O2 Sat:      96 % on Room air Temp:     98.3 degrees F oral Pulse rate:   67 / minute BP sitting:   110 / 72  (left arm) Cuff size:   large  Vitals Entered By: Zella Ball Ewing CMA Duncan Dull) (March 22, 2010 9:40 AM)  O2 Flow:  Room air CC: Right lower calf pain and swollen/RE   Primary Care [place]:  Corwin Levins MD  CC:  Right lower calf pain and swollen/RE.  History of Present Illness: here with acute onset mod to severe right leg calf dull pain for approx 1 day, seemed to start after heavy lifting with pressure to the leg; constant, pain about 5/10 in severit and assoc with unusual amount of swelling without knee pain/swelling.  Walking seems to make it worse, sitting seems to make better.  No prior hx of same, and no redness, fever, other trauma or injury.  Pt denies CP, worsening sob, doe, wheezing, orthopnea, pnd, worsening LE edema, palps, dizziness or syncope  Pt denies new neuro symptoms such as headache, facial or extremity weakness   Preventive Screening-Counseling & Management      Drug Use:  no.    Problems Prior to Update: 1)  Swelling of Limb  (ICD-729.81) 2)  Calf Pain, Right  (ICD-729.5) 3)  Calf Pain, Right  (ICD-729.5) 4)  Chest Pain  (ICD-786.50) 5)  Sinusitis- Acute-nos  (ICD-461.9) 6)  Preventive Health Care  (ICD-V70.0) 7)  Preventive Health Care  (ICD-V70.0) 8)  Premature Ventricular Contractions  (ICD-427.69) 9)  Anxiety  (ICD-300.00) 10)  Hypertension  (ICD-401.9) 11)  Gerd  (ICD-530.81) 12)  Atrial Fibrillation  (ICD-427.31) 13)  Pe  (ICD-415.19) 14)  Dvt, Hx of  (ICD-V12.51) 15)  Chest Pain  (ICD-786.50) 16)  Family History Diabetes 1st Degree Relative  (ICD-V18.0) 17)  Pulmonary Embolism, Hx of  (ICD-V12.51) 18)  Deep Venous Thrombophlebitis, Hx of  (ICD-V12.52)  Medications Prior to  Update: 1)  Coumadin 4 Mg Tabs (Warfarin Sodium) .... Take As Directed By Coumadin Clinic. 2)  Toprol Xl 25 Mg  Tb24 (Metoprolol Succinate) .Marland Kitchen.. 1 Tab Once Daily  Current Medications (verified): 1)  Coumadin 4 Mg Tabs (Warfarin Sodium) .... Take As Directed By Coumadin Clinic. 2)  Toprol Xl 25 Mg  Tb24 (Metoprolol Succinate) .Marland Kitchen.. 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   Head:  normocephalic and atraumatic.   Eyes:  vision grossly intact, pupils equal, and pupils round.  Ears:  R ear normal and L ear normal.   Nose:  no external deformity and no nasal discharge.   Mouth:  no gingival abnormalities and pharynx pink and moist.   Neck:  supple and no masses.   Lungs:  normal respiratory effort and normal breath sounds.   Heart:  normal rate and regular rhythm.   Msk:  no acute joint tenderness and no joint swelling.   Extremities:  LLE without sweling or tenderness; RLE with 2+ diffuse swelling, tender to leg below the knee primarily assoc with post leg at the gastroc.,  some distal swelling noted as well but not below the ankle   Impression & Recommendations:  Problem # 1:  CALF PAIN, RIGHT (ICD-729.5)  with swelling -  for stat  LE venous doppler - r/o DVT, but suspect possible calf strain or even tear ; if doppler neg, might consider MRI calf or at least ortho referral  Orders: Misc. Referral (Misc. Ref)  Problem # 2:  HYPERTENSION (ICD-401.9)  His updated medication list for this problem includes:    Toprol Xl 25 Mg Tb24 (Metoprolol succinate) .Marland Kitchen... 1 tab once daily  BP today: 110/72 Prior BP: 130/80 (04/14/2009)  Labs Reviewed: K+: 4.2 (03/29/2009) Creat: : 1.0 (03/29/2009)   Chol: 188 (03/29/2009)   HDL: 34.70 (03/29/2009)   LDL: DEL (03/07/2008)   TG: 252.0 (03/29/2009) stable overall by hx and exam, ok to continue meds/tx as is   Complete Medication List: 1)  Coumadin 4 Mg Tabs (Warfarin sodium) .... Take as directed by coumadin clinic. 2)  Toprol Xl 25 Mg Tb24 (Metoprolol succinate) .Marland Kitchen.. 1 tab once daily  Patient Instructions: 1)  You will be contacted about the referral(s) to: Right leg venous doppler to make sure no blood clot today 2)  Continue all previous medications as before this visit  3)  Please schedule a follow-up appointment in 1 month with CPX labs   Orders Added: 1)  Misc. Referral [Misc. Ref] 2)  Est. Patient Level IV [36644]

## 2010-06-21 NOTE — Medication Information (Signed)
Summary: rov/ewj      Allergies Added: NKDA Anticoagulant Therapy  Managed by: Louie Casa, PharmD Referring MD: Dietrich Pates PCP: Corwin Levins MD Supervising MD: Excell Seltzer MD, Casimiro Needle Indication 1: Pulmonary embolus (ICD-415.19) Lab Used: [facility] Site: [facility] INR POC 2.4 INR RANGE 2 - 3  Dietary changes: no    Health status changes: no    Bleeding/hemorrhagic complications: no    Recent/future hospitalizations: no    Any changes in medication regimen? no    Recent/future dental: no  Any missed doses?: no       Is patient compliant with meds? yes       Current Medications (verified): 1)  Coumadin 4 Mg Tabs (Warfarin Sodium) .... Take As Directed By Coumadin Clinic. 2)  Toprol Xl 25 Mg  Tb24 (Metoprolol Succinate) .Marland Kitchen.. 1 Tab Once Daily  Allergies (verified): No Known Drug Allergies  Anticoagulation Management History:      The patient is taking warfarin and comes in today for a routine follow up visit.  Negative risk factors for bleeding include an age less than 29 years old.  The bleeding index is 'low risk'.  Positive CHADS2 values include History of HTN.  Negative CHADS2 values include Age > 96 years old.  The start date was 10/02/1999.  His last INR was 2.7.  Anticoagulation responsible provider: Excell Seltzer MD, Casimiro Needle.  INR POC: 2.4.  Cuvette Lot#: [id number].  Exp: 06/2010.    Anticoagulation Management Assessment/Plan:      The patient's current anticoagulation dose is Coumadin 4 mg tabs: Take as directed by coumadin clinic..  The target INR is 2 - 3.  The next INR is due 07/24/2009.  Anticoagulation instructions were given to patient.  Results were reviewed/authorized by Louie Casa, PharmD.         Prior Anticoagulation Instructions: INR 2.7  Continue on same dosage 2 tablets daily except 1.5 tablets on Mondays.   Recheck in 4 weeks.    Current Anticoagulation Instructions: INR 2.4  CONTINUE TO TAKE 2 TABS EVERYDAY EXCEPT TAKE 1.5 TABS ON  MONDAY.  RECHECK IN 4 WEEKS.

## 2010-06-21 NOTE — Progress Notes (Signed)
Summary: vascular results  Phone Note From Other Clinic   Caller: [facility] Summary of Call: Justin Woodard called to state that pt's right leg is negative for DVT. Initial call taken by: Alysia Penna,  March 22, 2010 12:29 PM  Follow-up for Phone Call        venous doppler neg for dvt -  LMOPT - labs negative, normal, or stable  - No Acute problem  Follow-up by: Corwin Levins MD,  March 22, 2010 5:40 PM

## 2010-06-21 NOTE — Progress Notes (Signed)
Summary: Work note  Phone Note Call from Patient Call back at [employer] [phone]   Caller: Patient Call For: dr Jonny Ruiz Summary of Call: Pt needs note for work so he can return to work today. Please fax to [phone]. Initial call taken by: Verdell Face,  March 26, 2010 8:38 AM  Follow-up for Phone Call        ok for work note - to robin to handle Follow-up by: Corwin Levins MD,  March 26, 2010 4:21 PM  Additional Follow-up for Phone Call Additional follow up Details #1::        called pt. informed letter completed and faxed. Additional Follow-up by: Robin Ewing CMA Duncan Dull),  March 26, 2010 4:33 PM

## 2010-06-21 NOTE — Medication Information (Signed)
Summary: rov/tm  Anticoagulant Therapy  Managed by: Weston Brass, PharmD Referring MD: Dietrich Pates PCP: Corwin Levins MD Supervising MD: Graciela Husbands MD, Viviann Spare Indication 1: Pulmonary embolus (ICD-415.19) Lab Used: [facility] Site: [facility] INR RANGE 2 - 3  Dietary changes: no    Health status changes: no    Bleeding/hemorrhagic complications: no    Recent/future hospitalizations: no    Any changes in medication regimen? no    Recent/future dental: no  Any missed doses?: no       Is patient compliant with meds? yes       Allergies: No Known Drug Allergies  Anticoagulation Management History:      The patient is taking warfarin and comes in today for a routine follow up visit.  Negative risk factors for bleeding include an age less than 26 years old.  The bleeding index is 'low risk'.  Positive CHADS2 values include History of HTN.  Negative CHADS2 values include Age > 50 years old.  The start date was 10/02/1999.  His last INR was 2.7.  Anticoagulation responsible provider: Graciela Husbands MD, Viviann Spare.  Cuvette Lot#: [id number].  Exp: 12/2010.    Anticoagulation Management Assessment/Plan:      The patient's current anticoagulation dose is Coumadin 4 mg tabs: Take as directed by coumadin clinic..  The target INR is 2 - 3.  The next INR is due 11/13/2009.  Anticoagulation instructions were given to patient.  Results were reviewed/authorized by Weston Brass, PharmD.  He was notified by [facility].         Prior Anticoagulation Instructions: INR 2.6 Continue 8mg s daily except 6mg s on Mondays. Recheck in 4 weeks.   Current Anticoagulation Instructions: INR-2.3 Resume normal dosing schedule. Take take 1.5 tablets on Monday and take 2 tablets on all other days. Return in 4 weeks

## 2010-06-21 NOTE — Progress Notes (Signed)
----   Converted from flag ---- ---- 04/03/2010 9:05 PM, Corwin Levins MD wrote: Zella Ball - to please call pt - is calf better, or should I refer to ortho? ------------------------------  called pt. left msg. to call back.  Called patient he said he stayed off the calf all weekend and is much better. He does not need a referral. Robin Ewing CMA (AAMA)  April 04, 2010 2:22 PM

## 2010-07-02 ENCOUNTER — Encounter (INDEPENDENT_AMBULATORY_CARE_PROVIDER_SITE_OTHER): Payer: 59

## 2010-07-02 ENCOUNTER — Encounter: Payer: Self-pay | Admitting: Cardiology

## 2010-07-02 DIAGNOSIS — I2699 Other pulmonary embolism without acute cor pulmonale: Secondary | ICD-10-CM

## 2010-07-02 DIAGNOSIS — Z7901 Long term (current) use of anticoagulants: Secondary | ICD-10-CM

## 2010-07-03 DIAGNOSIS — Z7901 Long term (current) use of anticoagulants: Secondary | ICD-10-CM

## 2010-07-03 DIAGNOSIS — I2699 Other pulmonary embolism without acute cor pulmonale: Secondary | ICD-10-CM

## 2010-07-03 DIAGNOSIS — I4891 Unspecified atrial fibrillation: Secondary | ICD-10-CM

## 2010-07-03 HISTORY — DX: Long term (current) use of anticoagulants: Z79.01

## 2010-07-11 NOTE — Medication Information (Signed)
Summary: [facility]   Anticoagulant Therapy  Managed by: Georgina Pillion, PharmD Referring MD: Dietrich Pates PCP: Corwin Levins MD Supervising MD: Jens Som MD, Arlys John Indication 1: Pulmonary embolus (ICD-415.19) Lab Used: [facility] Site: [facility] INR POC 2.5 INR RANGE 2 - 3  Dietary changes: no    Health status changes: no    Bleeding/hemorrhagic complications: no    Recent/future hospitalizations: no    Any changes in medication regimen? no    Recent/future dental: no  Any missed doses?: no       Is patient compliant with meds? yes       Allergies: No Known Drug Allergies  Anticoagulation Management History:      Negative risk factors for bleeding include an age less than 70 years old.  The bleeding index is 'low risk'.  Positive CHADS2 values include History of HTN.  Negative CHADS2 values include Age > 40 years old.  The start date was 10/02/1999.  His last INR was 2.7.  Anticoagulation responsible provider: Jens Som MD, Arlys John.  INR POC: 2.5.  Cuvette Lot#: [id number].  Exp: 05/2011.    Anticoagulation Management Assessment/Plan:      The patient's current anticoagulation dose is Coumadin 4 mg tabs: Take as directed by coumadin clinic..  The target INR is 2 - 3.  The next INR is due 07/30/2010.  Anticoagulation instructions were given to patient.  Results were reviewed/authorized by Georgina Pillion, PharmD.         Prior Anticoagulation Instructions: INR:  2.2 (goal 2-3)  Your INR is at goal today.  Continue taking your coumadin 6 mg on Tuesday, Friday and 8 mg other days of the week.  Return to clinic in 4 weeks for another INR check.    Current Anticoagulation Instructions: Continue current regimen of 2 tablets (8 mg) daily EXCEPT for 1 1/2 tablets (6 mg) on Mondays and Fridays only.

## 2010-07-30 ENCOUNTER — Encounter (INDEPENDENT_AMBULATORY_CARE_PROVIDER_SITE_OTHER): Payer: 59

## 2010-07-30 ENCOUNTER — Encounter: Payer: Self-pay | Admitting: Cardiology

## 2010-07-30 DIAGNOSIS — Z7901 Long term (current) use of anticoagulants: Secondary | ICD-10-CM

## 2010-07-30 DIAGNOSIS — I2699 Other pulmonary embolism without acute cor pulmonale: Secondary | ICD-10-CM

## 2010-07-30 LAB — CONVERTED CEMR LAB: POC INR: 2.1

## 2010-08-05 LAB — PROTIME-INR: INR: 2.17 — ABNORMAL HIGH (ref 0.00–1.49)

## 2010-08-07 NOTE — Medication Information (Signed)
Summary: rov/ewj  Anticoagulant Therapy  Managed by: Cloyde Reams, RN, BSN Referring MD: Dietrich Pates PCP: Corwin Levins MD Supervising MD: Riley Kill MD, Maisie Fus Indication 1: Pulmonary embolus (ICD-415.19) Lab Used: [facility] Site: [facility] INR POC 2.1 INR RANGE 2 - 3  Dietary changes: no    Health status changes: no    Bleeding/hemorrhagic complications: no    Recent/future hospitalizations: no    Any changes in medication regimen? no    Recent/future dental: no  Any missed doses?: no       Is patient compliant with meds? yes       Allergies: No Known Drug Allergies  Anticoagulation Management History:      The patient is taking warfarin and comes in today for a routine follow up visit.  Negative risk factors for bleeding include an age less than 28 years old.  The bleeding index is 'low risk'.  Positive CHADS2 values include History of HTN.  Negative CHADS2 values include Age > 68 years old.  The start date was 10/02/1999.  His last INR was 2.7.  Anticoagulation responsible provider: Riley Kill MD, Maisie Fus.  INR POC: 2.1.  Cuvette Lot#: [id number].  Exp: 07/2011.    Anticoagulation Management Assessment/Plan:      The patient's current anticoagulation dose is Coumadin 4 mg tabs: Take as directed by coumadin clinic..  The target INR is 2 - 3.  The next INR is due 08/27/2010.  Anticoagulation instructions were given to patient.  Results were reviewed/authorized by Cloyde Reams, RN, BSN.  He was notified by Cloyde Reams RN.         Prior Anticoagulation Instructions: Continue current regimen of 2 tablets (8 mg) daily EXCEPT for 1 1/2 tablets (6 mg) on Mondays and Fridays only.  Current Anticoagulation Instructions: INR 2.1  Continue on same dosage 2 tablets daily except 1.5 tablets on Mondays and Fridays.  Recheck in 4 weeks.

## 2010-08-14 ENCOUNTER — Encounter: Payer: Self-pay | Admitting: Internal Medicine

## 2010-08-27 ENCOUNTER — Ambulatory Visit (INDEPENDENT_AMBULATORY_CARE_PROVIDER_SITE_OTHER): Payer: 59 | Admitting: *Deleted

## 2010-08-27 DIAGNOSIS — Z7901 Long term (current) use of anticoagulants: Secondary | ICD-10-CM

## 2010-08-27 DIAGNOSIS — I2699 Other pulmonary embolism without acute cor pulmonale: Secondary | ICD-10-CM

## 2010-08-27 DIAGNOSIS — I4891 Unspecified atrial fibrillation: Secondary | ICD-10-CM

## 2010-08-27 NOTE — Patient Instructions (Signed)
INR 2.5 Continue taking 2 tablets (8mg ) daily, except take 1 1/2 tablets on Mondays and Fridays. Recheck in 4 weeks.

## 2010-09-24 ENCOUNTER — Ambulatory Visit (INDEPENDENT_AMBULATORY_CARE_PROVIDER_SITE_OTHER): Payer: 59 | Admitting: *Deleted

## 2010-09-24 DIAGNOSIS — I2699 Other pulmonary embolism without acute cor pulmonale: Secondary | ICD-10-CM

## 2010-09-24 DIAGNOSIS — I4891 Unspecified atrial fibrillation: Secondary | ICD-10-CM

## 2010-09-24 DIAGNOSIS — Z7901 Long term (current) use of anticoagulants: Secondary | ICD-10-CM

## 2010-09-24 LAB — POCT INR: INR: 2

## 2010-10-02 NOTE — H&P (Signed)
Justin Woodard, Justin Woodard                ACCOUNT NO.:  [id number]   MEDICAL RECORD NO.:  [id number]          PATIENT TYPE:  INP   LOCATION:  3736                         FACILITY:  [facility]   PHYSICIAN:  Madolyn Frieze. Jens Som, MD, FACCDATE OF BIRTH:  16-Aug-1955   DATE OF ADMISSION:  05/03/2008  DATE OF DISCHARGE:                              HISTORY & PHYSICAL   PRIMARY CARDIOLOGISTS:  1. Doylene Canning. Ladona Ridgel, MD  2. Gerrit Friends. Dietrich Pates, MD, [facility]   PRIMARY CARE PHYSICIAN:  Dr. Jonny Ruiz.   Mr. Satz is a 55 year old Caucasian gentleman with no history of known  coronary artery disease status post previous stress Myoview in 2005 that  was negative for ischemia.  Previous admissions for atypical chest  discomfort, felt to be secondary to GERD.  Mr. Blank was in his usual  state of health, which he states he has chronic chest pain for 6-8  years.  He states the chest discomfort is intermittent.  He describes as  substernal chest discomfort/epigastric discomfort, not associated with  any other symptoms, although he states the chest discomfort is worse  after meals or lying flat.  He does experience a burning sensation in  his throat at times.  The patient apparently is supposed to be on Nexium  40 mg b.i.d.  He states he has not taken this recently nor does he take  it consistently.  Chest discomfort lasted around 15 seconds, currently  is pain free.  This morning, the discomfort was so intense that he  decided to get evaluated.  [facility] where he  was evaluated and treated with nitroglycerin without relief in symptoms  and then transferred [facility] to [facility] for further evaluation.   PAST MEDICAL HISTORY:  1. Pulmonary emboli/DVT approximately 10 years ago.  2. GERD.  3. Atypical chest pain.  4. Paroxysmal atrial fib status post surgery.  5. 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  6. Hypertension.  7.  Anxiety.   SOCIAL HISTORY:  Lives in [place].  He works for the [employer]  [employer].  Quit using tobacco back in 2000.  Exercise, he tries to ride his  bike at least 3 times a week.  Denies any drug or herbal medication use.   FAMILY HISTORY:  Positive for coronary artery disease.  MI in his father  at age 45.   REVIEW OF SYSTEMS:  Positive for chest pain as described above, dyspnea  on exertion chronic since pulmonary emboli, chronic ankle edema, and  cough.  GI: Positive for GERD symptoms.   ALLERGIES:  No known drug allergies.   MEDICATIONS:  1. Toprol-XL 12.5 mg daily.  2. Coumadin per Dr. Shelby Dubin.  3. Nexium 40 mg b.i.d. which the patient takes intermittently.   PHYSICAL EXAMINATION:  VITAL SIGNS:  Temperature 96.7, heart rate 56,  respirations 18, blood pressure 153/93, sat 100% on room air.  GENERAL:  No acute distress.  HEENT: Unremarkable.  NECK:  Supple without lymphadenopathy.  No bruits,  no JVD.  CARDIOVASCULAR:  S1 and S2.  Regular rate and rhythm.  LUNGS:  Clear to auscultation bilaterally.  SKIN:  Warm and dry.  ABDOMEN:  Soft, nontender, positive bowel sounds.  LOWER EXTREMITIES:  Without clubbing, cyanosis, or edema.  NEUROLOGICAL:  Alert and oriented x3.   Chest x-ray showing mild hyperinflation configuration, no acute  findings.  EKG, sinus rhythm without acute ST or T-wave changes.   LABORATORY WORK:  H and H 13.4 and 38.7, WBCs 5, platelets 209,000.  Sodium 138, potassium 4.1, BUN 12, creatinine 1.0, glucose 116.  Point-  of-care is negative.  INR 2.8   IMPRESSION:  Chest pain atypical, not felt to be pleuritic, most likely  gastroesophageal reflux disease in nature.  As the patient's symptoms  are worse since off Nexium, plan is to admit the patient to telemetry;  cycle enzymes, if it is negative, discharge in the morning.  No further  cardiac workup at this time.  The patient has been evaluated by Dr.  Olga Millers who has examined the  patient and agrees with plan of  care.      Dorian Pod, ACNP      Madolyn Frieze. Jens Som, MD, [facility]  Electronically Signed    MB/MEDQ  D:  05/04/2008  T:  05/04/2008  Job:  [id number]

## 2010-10-02 NOTE — Discharge Summary (Signed)
Justin Woodard, Justin Woodard                ACCOUNT NO.:  [id number]   MEDICAL RECORD NO.:  [id number]          PATIENT TYPE:  INP   LOCATION:  3736                         FACILITY:  [facility]   PHYSICIAN:  Doylene Canning. Ladona Ridgel, MD    DATE OF BIRTH:  [birth year]   DATE OF ADMISSION:  05/03/2008  DATE OF DISCHARGE:  05/04/2008                               DISCHARGE SUMMARY   The patient has no known drug allergies.   FINAL DIAGNOSES:  1. Chest pain.  The patient has a history of chronic chest discomfort.  2. Troponin I studies negative this admission less than 0.01, then      0.01.  3. Electrocardiogram nondiagnostic for acute coronary syndrome.  4. Pain is worse after meals - possibly reflux.  5. The patient has been on Nexium in the past for gastroesophageal      reflux disease, however, he has stopped taking it.  6. The patient is admonished to restart taking Nexium 40 mg daily.   SECONDARY DIAGNOSES:  1. History of deep vein thrombosis/pulmonary embolism.  The patient is      on chronic Coumadin.  2. Gastroesophageal reflux disease.  3. Paroxysmal atrial fibrillation.  4. Hypertension.  5. Anxiety.  6. Stress tests are negative x2 both at [facility] in 2005 and      2007.   BRIEF HISTORY:  Mr. Canady is a 55 year old male admitted through the  emergency room with chest pain.  It was similar to the chest pain he has  had the past.  He had onset in the morning of May 03, 2008.  He was  also lightheaded intermittently with this.  He did not have chest pain  with exertion.  There was no radiation to the arms or the jaw.  He had  no associated nausea, vomiting, shortness of breath, or diaphoresis.  This is not pleuritic in its nature.  It apparently has increased after  eating or when lying flat.  The patient also relates occasional water  brash.   The patient mentions that his symptoms have increased since he has  stopped taking Nexium.  The initial enzymes in the emergency room  were  negative.  Electrocardiogram is showing no ST changes.  His INR is  therapeutic at 2.8.  It is thought that the symptoms are likely  secondary to reflux.   Plan, the patient will be admitted.  He will have cardiac enzymes cycled  if they are negative.  The patient will be discharge the following  morning of May 04, 2008, especially if he has no continuing chest  pain.   HOSPITAL COURSE:  The patient admitted through the emergency room.  His  cardiac enzymes are negative.  Electrocardiogram shows sinus rhythm with  no ST elevations.  It is nondiagnostic for acute coronary syndrome.  The  patient has not had recurrent chest pain while here in the hospital once  again.  He is admonished to take his proton pump inhibitor daily.   LABORATORY STUDIES THIS ADMISSION:  Once again, troponin I study is less  than 0.01, then 0.01.  His metabolic panel; sodium 136, potassium 3.7,  chloride 101, carbonate 26, glucose 105, BUN is 11, and creatinine  1.01.  Alkaline phosphatase is 59, SGOT 25, and SGPT is 22.  His D-dimer  this admission was less than 0.22.  Protime this admission 31.1 and INR  2.8.  Complete blood count; white cells 5.6, hemoglobin 12.7, hematocrit  37, and platelets are 207.      Maple Mirza, PA      Doylene Canning. Ladona Ridgel, MD  Electronically Signed    GM/MEDQ  D:  05/04/2008  T:  05/05/2008  Job:  [id number]   cc:   Doylene Canning. Ladona Ridgel, MD  Gerrit Friends. Dietrich Pates, MD, [facility]  Dr. Jonny Ruiz

## 2010-10-05 NOTE — H&P (Signed)
Justin Woodard, Justin Woodard                ACCOUNT NO.:  [id number]   MEDICAL RECORD NO.:  [id number]          PATIENT TYPE:  INP   LOCATION:  3715                         FACILITY:  [facility]   PHYSICIAN:  Pricilla Riffle, M.D.    DATE OF BIRTH:  09-08-1955   DATE OF ADMISSION:  12/27/2004  DATE OF DISCHARGE:                                HISTORY & PHYSICAL   IDENTIFICATION:  Patient is a 55 year old gentleman with history of  pulmonary emboli in the past (six years ago).  He has a history of chronic  lower extremity phlebitis and had problems after venectomy at [facility].   The patient has been seen in cardiology clinic in the past by Dr. Ladona Ridgel.  Yesterday he was seen by Jacolyn Reedy, P.A. in clinic.  He gave complaints  of chest tightness that occurred over the past several months not associated  with any particular activity, in fact, usually occurred when he was sitting  in a chair.  Often he had told it was after he ate meals.  Followed by some  anxiety.  He had taken Nexium in the past, but not present.  She had  recommended that he take Nexium twice a day to see if he improves and call  back, if not, schedule for a stress test.   The patient said last night he went to the garden store with his wife and he  began experiencing chest discomfort.  Would come and go.  Not associated  with any shortness of breath but he just did not feel right.  Went on until  about midnight.  Went to sleep.  This morning about 6 a.m. woke up and just  did not feel well.  Had recurrence of chest pressure, again come and go.  No  associated shortness of breath.  May have been somewhat positional.  Came to  the emergency room for further evaluation.   Patient says he has been pain-free since about noon.   PAST MEDICAL HISTORY:  1.  Pulmonary emboli.  2.  Paroxysmal atrial fibrillation.  3.  History of palpitations/PVCs.   SOCIAL HISTORY:  The patient lives in [place].  Quit tobacco in 2000.  Rides a  bike three times a week and does yard work.   ALLERGIES:  None.   MEDICATIONS:  1.  Toprol XL 25 daily.  2.  Coumadin as directed.  3.  Note, he had been off Coumadin for dental work.  Monday INR was 1.6.      Given Lovenox by Shelby Dubin in clinic.  Not sure what INR is now.   FAMILY HISTORY:  Father died of an MI at age 36.  Sister alive and well.   REVIEW OF SYSTEMS:  Negative except as noted above to the above problem.   PHYSICAL EXAMINATION:  GENERAL:  Patient is in no distress.  VITAL SIGNS:  Blood pressure 130/91 on arrival, decreased to 117/60, pulse  76, temperature 97.4.  O2 saturation on room air is 100%.  HEENT:  Normocephalic, atraumatic.  PERL.  LUNGS:  Clear.  CARDIAC:  Regular rate  and rhythm.  S1, S2.  No S3, S4, or murmurs.  ABDOMEN:  Supple, nontender.  No hepatomegaly.  EXTREMITIES:  Good distal pulses.  Trace lower extremity edema.   Chest x-ray:  Slight prominence of the left hilus probably secondary to  rotation.  12-lead EKG shows normal sinus rhythm at a rate of 60 beats per  minute.  Laboratories significant for a hemoglobin of 13.5, WBC of 4.2,  BUN/creatinine of 10 and 1.2, potassium of 3.5.  Point of care markers  negative x3.  D-dimer less than 0.22.   IMPRESSION:  The patient is a 55 year old gentleman who appears somewhat  anxious.  Has a history of chest pain over the past several months that has  some positional component.  It is also very atypical.  Occurs mostly when he  is not active.  Still, he is presenting to the emergency room because he has  not been feeling well.  Had chest pressure last night on and off from 8 to  midnight.  Will admit, rule out for myocardial infarction.  Note, D-dimer is  negative.  Will check INR.  If enzyme markers are negative would plan for  Myoview scan to rule out inducible ischemia.  Will begin Nexium b.i.d.  prophylactically for reflux.       PVR/MEDQ  D:  12/27/2004  T:  12/28/2004  Job:  [id number]

## 2010-10-05 NOTE — Assessment & Plan Note (Signed)
[facility]                           ELECTROPHYSIOLOGY OFFICE NOTE   NAME:Justin Woodard, Justin Woodard                       MRN:          [id number]  DATE:04/02/2006                            DOB:          1956/03/28    Mr. Kerschner returns today for followup.  He is a very pleasant, middle-aged  male with a history of massive pulmonary embolism in the past and a history  of intermittent chest pain at least partly related to gastroesophageal  reflux disease.  The patient's pain has been stable for many months but he  had a recurrent episode of pain several weeks ago, prompting a visit to the  emergency room.  Despite a therapeutic INR, he had a CT scan which  demonstrated no pulmonary embolism.  Apparently, his cardiac enzymes are  negative and he was discharged home.  He has not been on his acid reflux  medications.  The patient also notes that he has increasing dyspnea with  exertion which has been present now when he does anything strenuous like  walking up a flight of stairs or if he gets in a hurry and tries to walk  fast, he notes that he gets winded very easily and has some chest fullness.   PHYSICAL EXAM:  He is a pleasant, well-appearing, middle-aged man in no  acute distress.  His blood pressure was 128/74 with the pulse of 75 and regular.  The  respirations were 18 and the weight was 294 pounds.  NECK:  No jugular venous distention.  LUNGS:  Clear bilaterally to auscultation.  CARDIOVASCULAR:  Regular rate and rhythm with normal S1 and S2.  EXTREMITIES:  No edema.   Medications include:  1. Toprol XL 12.5 daily.  2. Coumadin 8 mg daily.   IMPRESSION:  1. Recurrent chest pain with both typical and atypical features for      coronary disease.  2. History of massive pulmonary embolism.  3. Chronic Coumadin therapy.  4. Dyspnea with exertion.   DISCUSSION:  While Mr. Gillespie' symptoms may be related to acid reflux  disease, as he is no longer on  proton-pump inhibitors, I think there is a  concern that he has developed occult coronary disease.  He had a stress test  2 years ago which was okay but I have recommended that we repeat this in the  next few weeks.  If this is negative, then I would recommend him getting  back on his proton-pump inhibitors.  I will plan to see him back as needed.     Doylene Canning. Ladona Ridgel, MD  Electronically Signed    GWT/MedQ  DD: 04/02/2006  DT: 04/02/2006  Job #: [id number]

## 2010-10-05 NOTE — Consult Note (Signed)
NAMETOMA, Justin Woodard                ACCOUNT NO.:  [id number]   MEDICAL RECORD NO.:  [id number]          PATIENT TYPE:  EMS   LOCATION:  MAJO                         FACILITY:  [facility]   PHYSICIAN:  Valerie A. Felicity Coyer, MDDATE OF BIRTH:  07-24-1955   DATE OF CONSULTATION:  09/09/2006  DATE OF DISCHARGE:  09/09/2006                                 CONSULTATION   CHIEF COMPLAINT:  Shortness of breath.   HISTORY OF PRESENT ILLNESS:  Asked by Dr. Ethelda Chick of the emergency  room at [facility] to evaluate this pleasant 55 year old gentleman with  dyspnea.  Mr. Lantzy takes lifelong Coumadin secondary to history of  recurrent PE, most recent event in May of 2001.  He awoke this morning  around 5:00 a.m. feeling somewhat short of breath, but specifically  denies pain, cough, burning or fever.  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  Since being in the  emergency room, patient has experienced spontaneously resolution of his  shortness of breath he says approximately around 11:00 a.m. when he  realized that he was breathing normally and without problems.  Again, he  has had no chest pain, pleurisy, cough or sputum, no sick contacts and  previous history of coronary artery disease.   PAST MEDICAL HISTORY:  1. History of recurrent PE with DVT on lifelong Coumadin.  2. Paroxysmal AFib, primarily seen in association with thromboembolic      events.  3. History of hypertension.  4. History of GERD, noncompliant with proton pump inhibitor.  See      below.  5. Negative Cardiolite in November of 2005, reportedly Cardiolite also      negative within the last six months, report being faxed at time of      dictation.   MEDICATIONS:  Currently  include:  1. Coumadin 10 mg alternating with 8 mg daily.  2. Toprol XL 12.5 mg daily.  3. Nexium one daily, though patient has not taken in over two weeks.   ALLERGIES:  Patient has no known drug allergies.   FAMILY HISTORY:  Significant for his father who passed away from a  massive MI at the age of 37, no other family members with coronary  disease, premature death or heart failure.  His mother is alive at age  40 without significant health problems.   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   REVIEW OF SYSTEMS:  Negative for nausea, vomiting or diaphoresis.  No  dietary changes.  No sick contacts or recent travel.  No history  personally of diabetes or dyslipidemia.  Please see HPI above for other  systems review which is negative as listed.   PHYSICAL EXAMINATION:  VITAL SIGNS:  Temperature of 98.6, blood pressure  116/80, pulse of 66, respirations 16, sating 95% on room air.  GENERAL:  He is a pleasant and appropriate middle-age gentleman in no  acute distress sitting on the stretcher of the emergency room without  complaint.  NECK:  Supple without JVD, lymphadenopathy or appreciable goiter.  LUNGS:  Clear to auscultation bilaterally with good air movement.  No  wheeze, crackle or rhonchi appreciated.  CARDIOVASCULAR EXAM:  Regular rate and rhythm without murmur, gallop or  rub.  ABDOMEN:  Somewhat obese with good bowel sounds.  Soft, nontender and  nondistended.  EXTREMITIES:  Showed no swelling or edema.  Good cap refill bilaterally.  NEUROLOGICALLY:  He is awake, alert and oriented x4, appropriate,  follows commands and has no gross motor deficits.   Labs show an INR of 2.6.  His D-dimer is less than 0.22 (undetectable).  Point-of-care cardiac enzymes are negative x1 and an i-STAT 8 plus i-  STAT creatinine are also within normal limits.   Two-view chest x-ray shows no acute  disease.   EKG shows normal sinus rhythm with 61 beats per minute, no ST-T changes,  no T wave elevation or other ischemic change.   ASSESSMENT/PLAN:  1. Atypical dyspnea with atypical chest pain, now resolved.  Patient      was sent for evaluation due to a history of pulmonary embolism and      I see no evidence for recurrent pulmonary embolism with negative D-      dimer, a therapeutic INR and normal O2 sats.  Patient even agrees      that this does not feel like previous pulmonary embolism.  I also      doubt angina equivalent, as patient denies fever, coronary artery      disease, a normal EKG, negative point-of-care enzymes and      reportedly a negative Cardiolite within the last six months, this      report is being faxed as I dictate the note and will be confirmed      prior to discharge.  I suspect his symptoms are related to      gastroesophageal reflux disease symptoms and on further exam and      history taken with the patient explaining my concern, he reports      that he had similar symptoms with dyspnea upon awaking about two to      three weeks ago at which time he also had bad taste in the back of      his mouth that he attributed to reflux at the time.  He took Nexium      on an as-needed basis, but thought this was for burn and as he has      not experienced heartburn per se has not taken his proton pump      inhibitor.  I have discussed with patient options regarding      overnight observation to rule out an acute coronary syndrome with      serial enzymes and telemetry monitoring versus discharge home with      a proton pump inhibitor trial for probable gastroesophageal reflux      disease as cause of his symptoms.  Patient has chosen proton pump      inhibitor trial and agrees to a discussed understanding to return      to the emergency room if he has recurrent symptoms prior to     followup with his primary  MD which has been arranged in the next 48      hours.   He will see Dr. Oliver Barre Thursday, April 24, at 2:00 p.m.      to review his dyspnea symptoms, trial of reflux and further      evaluation possible for GI, pulmonary and/or cardiology depending      on his symptoms.  The patient has a prescription currently with a      one-month supply of Nexium at his home, no medication changes will      be made during this time.  2. Gastroesophageal reflux disease.  Please see above discussion.      Patient will take Nexium daily as above and consider outpatient GI      evaluation which will be deferred to primary MD to arrange as      needed.  3. History of recurrent pulmonary embolism.  As stated above, patient      has a therapeutic INR, normal O2 sats and a clear chest x-ray.      Will continue the same Coumadin schedule with outpatient followup      at [facility] with Dr. Shelby Dubin as prior to      admission.  4. Questionable history of hypertension.  Patient denies clear history      of hypertension and says he takes metoprolol instead for his      previous irregular heart rhythm.  Regardless, patient will continue      his low-dose Toprol 12.5 mg daily as prior to admission.      Valerie A. Felicity Coyer, MD  Electronically Signed     VAL/MEDQ  D:  09/09/2006  T:  09/09/2006  Job:  [id number]

## 2010-10-05 NOTE — Discharge Summary (Signed)
NAMELEVEN, HOEL                ACCOUNT NO.:  [id number]   MEDICAL RECORD NO.:  [id number]          PATIENT TYPE:  INP   LOCATION:  3715                         FACILITY:  [facility]   PHYSICIAN:  [place] Bing, M.D. [facility] OF BIRTH:  06-30-1955   DATE OF ADMISSION:  12/27/2004  DATE OF DISCHARGE:  12/28/2004                                 DISCHARGE SUMMARY   DISCHARGE DIAGNOSIS:  1.  Atypical chest pain, negative cardiac workup.  2.  Past history of massive pulmonary embolism/deep venous thrombosis.  3.  Secondary paroxysmal atrial fibrillation resulting in pulmonary embolus.  4.  Anticoagulation therapy with Coumadin secondary to number 1.  5.  History of palpitations including premature ventricular contractures.  6.  History of recurrent superficial vein thrombophlebitis status post      venectomy in the right leg at [facility].  7.  Status post stress Myoview in November 2005 which showed an ejection      fraction of 54%, negative for ischemia or infarction.  8.  Hypertension controlled with medication.   DISPOSITION:  The patient is being discharged home with instructions to  continue his previous diet, activity as tolerated, wound care not  applicable.   DISCHARGE MEDICATIONS:  Nexium 40 mg p.o. b.i.d., Toprol XL 25 mg daily,  Coumadin 8 mg daily except for 6 mg on Saturdays.   FOLLOW UP:  Follow up with Dr. Ladona Ridgel August 22 at 4 p.m.  The patient is  instructed to keep his previously scheduled Coumadin Clinic visit.   HOSPITAL COURSE:  55 year old Caucasian gentleman with no known history of  coronary artery disease, history of pulmonary embolism, atrial fibrillation,  and Coumadin therapy, who presented to [facility] Emergency Room with  complaints of chest discomfort on the day of admission.  The patient has  been having somewhat atypical chest pain and was actually seen in our office  on the day prior to hospital admission for similar discomfort and was  instructed to refill his Nexium prescription and resume this.  Upon arrival  to the emergency room, the patient had not filled his Nexium prescription  yet and presented with the same discomfort he had been seen at the office  for.  Mr. France EKG showed normal sinus rhythm at a rate of 60.  Chest x-ray  showed slight prominent of the left hilus probably secondary to rotation.  Hemoglobin 13.5, WBC 14.2, BUN and creatinine normal at 10 and 1.2.  Point  of  care markers negative x 3.  D-Dimer less than 0.22.  The patient was given a  GI cocktail and started on Protonix 40 mg p.o. b.i.d. with relief in chest  discomfort.  The patient is being discharged home with instructions to  continue Nexium b.i.d., Toprol, Coumadin, and follow up with Dr. Ladona Ridgel as  instructed.      Dorian Pod, NP      [place] Bing, M.D. [facility]  Electronically Signed    MB/MEDQ  D:  12/28/2004  T:  12/28/2004  Job:  [id number]

## 2010-10-05 NOTE — Discharge Summary (Signed)
[place]  Patient:    Justin Woodard, Justin Woodard                       MRN: [id number] Adm. Date:  [id number] Disc. Date: 10/08/99 Attending:  Merwyn Katos CC:         Oley Balm Sung Amabile, M.D. [facility]             Dr. Jacolyn Reedy, [facility]                           Discharge Summary  DATE OF BIRTH:  05-Sep-1955.  ADMISSION DIAGNOSES: 1. Deep vein thrombosis. 2. Pulmonary embolism.  DISCHARGE DIAGNOSES: 1. Deep vein thrombosis. 2. Pulmonary embolism with right ventricular strain.  HISTORY OF PRESENT ILLNESS:  Please refer to the admission history and physical for his patients initial presentation.  Briefly, he is a 55 year old gentleman with a history of recurrent superficial vein thrombophlebitis.  In the past, he was on Coumadin, but this had been stopped recently.  He was recently evaluated at the [facility] and underwent venectomy on his right lower extremity.  Postoperatively, he was discharged home without complications.  He was not on any anticoagulation therapy postoperatively.  He was admitted with near-syncope, dyspnea, and left-sided chest discomfort.  HOSPITAL COURSE:  Admission studies demonstrated massive bilateral pulmonary emboli.  He was treated initially with intravenous heparin, and Coumadin was started on the second day of hospitalization.  An echocardiogram demonstrated right ventricular strain and overload.  EKG did demonstrate an S1, Q3, T3 pattern.  His hospitalization was uncomplicated and by the date of discharge, he was therapeutic on Coumadin for 48 hours.  Heparin was discontinued on the morning of discharge, and he was discharged to home.  Upon discharge, his room air oxygen saturations were 95%, and he had demonstrated the ability to ambulate without difficulty.  DISCHARGE MEDICATIONS:  Coumadin 10 mg p.o. q.d.  FOLLOW-UP INSTRUCTIONS:  He will be seen by Dr. Sung Amabile at [facility] on October 29, 1999.  He will be monitored in the Coumadin clinic at [facility].  He will require lifelong anticoagulation.  He is scheduled to see Dr. Jacolyn Reedy at the Vein Clinic at [facility] on Oct 12, 1999.  DISCHARGE INSTRUCTIONS:  I have instructed him to stay out of work until follow-up with me on June 11.  At that time, we can discuss his return to work.  DISCHARGE CONDITION:  Much improved. DD:  10/08/99 TD:  10/08/99 Job: [id number]

## 2010-10-05 NOTE — H&P (Signed)
Justin Woodard, Justin Woodard                ACCOUNT NO.:  [id number]   MEDICAL RECORD NO.:  [id number]          PATIENT TYPE:  INP   LOCATION:  3715                         FACILITY:  [facility]   PHYSICIAN:  Pricilla Riffle, M.D.    DATE OF BIRTH:  [birth year]   DATE OF ADMISSION:  12/27/2004  DATE OF DISCHARGE:                                HISTORY & PHYSICAL   CARDIOLOGIST:  Dr. Lewayne Bunting.   PRIMARY CARE PHYSICIAN:  Dr. Jonny Ruiz.   [name] is a 55 year old Caucasian gentleman presented to [facility]  Emergency Room with complaints of chest discomfort.  [name] appears  somewhat anxious, in no acute distress.  Evidently has been having this  atypical chest pain for several months and has been evaluated at our office  as of December 26, 2004.  Was seen for chest discomfort and recommended he  resume his Nexium.  However, at this time patient has not started.  States  he has not got his prescription filled.  However, [name] was walking with  his wife around 8 p.m. last night.  Complained of discomfort in his chest  above his left breast.  He describes it as a pressure that lasted around 10  seconds.  However, he also complained of palpable discomfort in that area  also.  He rated his chest discomfort around a 3.  He states there is no  increased frequency in his discomfort, no change in intensity.  It is not  associated with his activity or position.  However, he describes shortness  of breath with his chest discomfort and lightheadedness.  He complains of  mild dizziness and states he gets a little anxious when he has it.  He  denies any diaphoresis, nausea, or vomiting.  [name] is a very active  gentleman.  He is able to do yard work.  He rides his bicycle three times a  week.  Has never experienced any chest discomfort or shortness of breath  while riding his bicycle.  He also has complained of some abdominal  discomfort, generalized, not associated with meals or types of food eaten.  He  complains of loose stool at times.   ALLERGIES:  No known drug allergies.   MEDICATIONS:  1.  Toprol 25 mg daily.  2.  Coumadin 8 mg daily except 6 mg on Saturday.  3.  Nexium p.r.n.   PAST MEDICAL HISTORY:  No known coronary artery disease.  He has a history  of massive pulmonary embolism/DVT and paroxysmal atrial fibrillation with  anticoagulation therapy.  He has a history of palpitations, i.e., PVCs.  History of recurrent superficial vein thrombophlebitis status post venectomy  to the right leg at [facility] in the past.  [name] had a stress  Myoview study in November of 2005 which showed an EF of 54%, normal images,  no evidence of ischemia or infarction.  However, with stress there was a 2  mm of ST segment depression in inferior lateral leads.   SOCIAL HISTORY:  [name] lives in [place] with his wife.  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  He does a lot of heavy lifting  without chest discomfort.  He has two children.  Has a history of tobacco  use for approximately 20 years.  He quit in 2000.  For exercise he rides his  bike three times a week.  Occasionally plays baseball with his sons.  He  denies ETOH, drug, or herbal medication use.  He has diet lactose  intolerant.  Otherwise, he is on a regular diet.   FAMILY HISTORY:  Mother alive and well.  Father deceased at age 74 secondary  to an MI.  He has a sister who is alive and well with no known health  problems.   REVIEW OF SYSTEMS:  Positive for occasional headache.  Positive for chest  pain, shortness of breath as described above.  Positive for cough,  nonproductive.  GI:  Positive for GERD symptoms and abdominal pain.   PHYSICAL EXAMINATION:  VITAL SIGNS:  Temperature 97.4, pulse 76, regular,  respirations 16.  Initial blood pressure 130/91; however, currently blood  pressure is 117/60 without the use of any medications.  He is saturating  100% on room air.  GENERAL:  He is in no acute  distress, very pleasant and cooperative,  slightly apprehensive.  HEENT:  Pupils are equal, round, and reactive to light.  Sclera is clear.  NECK:  Supple without lymphadenopathy.  Negative bruit or JVD.  CARDIOVASCULAR:  Heart rate regular rhythm.  S1 and S2.  Pulses are 2+ and  equal without bruits.  LUNGS:  Clear to auscultation bilateral.  SKIN:  He has a well healed scar of the inner right leg.  ABDOMEN:  Soft, nontender.  Unable to palpate liver margin.  EXTREMITIES:  No clubbing, cyanosis, edema.  He has 2+ DPs bilateral.  NEUROLOGIC:  He is alert and oriented x3.  Cranial nerves II-XII grossly  intact.   Chest x-ray showing slight prominence of left hilus probably secondary to  rotation.  Recommend repeat chest x-ray.  EKG at a rate of 60, normal sinus  rhythm without ST or T-wave changes.  Laboratory work showing white blood  cell count of 4.2, hemoglobin 13.5, hematocrit 39.6, platelet count 233,000.  Sodium 135, potassium 3.5, chloride 103, CO2 24, BUN 10, creatinine 1.2 with  a glucose of 108, AST 27, ALT 29.  Point of care enzyme markers negative x3.  D-dimer less than 0.22.   Dr. Dietrich Pates in to examine this patient.  1.  [name] is a 55 year old Caucasian male, no known history of coronary      artery disease presenting with atypical chest pain.  Negative stress      test in November of 2005.  EKG unremarkable this admission.  Negative      point of care enzymes.  2.  Questionable GERD, abdominal discomfort, increased belching.  LFTs      within normal limits.  Will check an amylase.  Also place patient on a      PPI inhibitor b.i.d.  3.  Known hypertension, initially hypertensive today, but blood pressure      currently very stable at 117/60.  We will admit patient overnight, cycle      his enzymes, add a PPI, check his lipids, check a lipase/amylase level,     continue his Coumadin and Toprol, monitor his blood pressure.  No      further cardiac work-up at this  time unless enzymes are elevated.  Mic   MB/MEDQ  D:  12/27/2004  T:  12/28/2004  Job:  [id number]

## 2010-10-22 ENCOUNTER — Ambulatory Visit (INDEPENDENT_AMBULATORY_CARE_PROVIDER_SITE_OTHER): Payer: 59 | Admitting: *Deleted

## 2010-10-22 DIAGNOSIS — I4891 Unspecified atrial fibrillation: Secondary | ICD-10-CM

## 2010-10-22 DIAGNOSIS — Z7901 Long term (current) use of anticoagulants: Secondary | ICD-10-CM

## 2010-10-22 DIAGNOSIS — I2699 Other pulmonary embolism without acute cor pulmonale: Secondary | ICD-10-CM

## 2010-10-22 LAB — POCT INR: INR: 2.7

## 2010-10-31 ENCOUNTER — Other Ambulatory Visit: Payer: Self-pay | Admitting: Internal Medicine

## 2010-11-19 ENCOUNTER — Ambulatory Visit: Payer: 59 | Admitting: *Deleted

## 2010-11-19 DIAGNOSIS — I2699 Other pulmonary embolism without acute cor pulmonale: Secondary | ICD-10-CM

## 2010-11-19 DIAGNOSIS — Z7901 Long term (current) use of anticoagulants: Secondary | ICD-10-CM

## 2010-11-19 DIAGNOSIS — I4891 Unspecified atrial fibrillation: Secondary | ICD-10-CM

## 2010-11-19 LAB — POCT INR: INR: 2.3

## 2010-12-17 ENCOUNTER — Ambulatory Visit (INDEPENDENT_AMBULATORY_CARE_PROVIDER_SITE_OTHER): Payer: 59 | Admitting: *Deleted

## 2010-12-17 DIAGNOSIS — I4891 Unspecified atrial fibrillation: Secondary | ICD-10-CM

## 2010-12-17 DIAGNOSIS — Z7901 Long term (current) use of anticoagulants: Secondary | ICD-10-CM

## 2010-12-17 DIAGNOSIS — I2699 Other pulmonary embolism without acute cor pulmonale: Secondary | ICD-10-CM

## 2010-12-17 LAB — POCT INR: INR: 2.3

## 2011-01-14 ENCOUNTER — Ambulatory Visit (INDEPENDENT_AMBULATORY_CARE_PROVIDER_SITE_OTHER): Payer: 59 | Admitting: *Deleted

## 2011-01-14 DIAGNOSIS — Z7901 Long term (current) use of anticoagulants: Secondary | ICD-10-CM

## 2011-01-14 DIAGNOSIS — I4891 Unspecified atrial fibrillation: Secondary | ICD-10-CM

## 2011-01-14 DIAGNOSIS — I2699 Other pulmonary embolism without acute cor pulmonale: Secondary | ICD-10-CM

## 2011-01-14 LAB — POCT INR: INR: 2.2

## 2011-01-31 ENCOUNTER — Other Ambulatory Visit: Payer: Self-pay | Admitting: Internal Medicine

## 2011-02-11 ENCOUNTER — Encounter: Payer: 59 | Admitting: *Deleted

## 2011-02-11 ENCOUNTER — Ambulatory Visit (INDEPENDENT_AMBULATORY_CARE_PROVIDER_SITE_OTHER): Payer: 59 | Admitting: *Deleted

## 2011-02-11 DIAGNOSIS — Z7901 Long term (current) use of anticoagulants: Secondary | ICD-10-CM

## 2011-02-11 DIAGNOSIS — I2699 Other pulmonary embolism without acute cor pulmonale: Secondary | ICD-10-CM

## 2011-02-11 DIAGNOSIS — I4891 Unspecified atrial fibrillation: Secondary | ICD-10-CM

## 2011-02-11 LAB — POCT INR: INR: 2.4

## 2011-02-18 LAB — POCT I-STAT, CHEM 8
Chloride: 102
Glucose, Bld: 110 — ABNORMAL HIGH
HCT: 39
Potassium: 3.7
Sodium: 139

## 2011-02-18 LAB — CBC
Platelets: 213
RDW: 13.3

## 2011-02-18 LAB — POCT CARDIAC MARKERS
CKMB, poc: 1.1
Myoglobin, poc: 95.1
Troponin i, poc: <0.05

## 2011-02-18 LAB — DIFFERENTIAL
Basophils Absolute: 0
Lymphocytes Relative: 35
Neutro Abs: 2.8

## 2011-02-18 LAB — D-DIMER, QUANTITATIVE: D-Dimer, Quant: <0.22

## 2011-02-18 LAB — PROTIME-INR: INR: 3 — ABNORMAL HIGH

## 2011-02-22 LAB — POCT CARDIAC MARKERS
Myoglobin, poc: 86.2 ng/mL (ref 12–200)
Troponin i, poc: <0.05 ng/mL (ref 0.00–0.09)

## 2011-02-22 LAB — DIFFERENTIAL
Basophils Absolute: 0 10*3/uL (ref 0.0–0.1)
Eosinophils Relative: 3 % (ref 0–5)
Lymphocytes Relative: 26 % (ref 12–46)
Lymphs Abs: 1.3 10*3/uL (ref 0.7–4.0)
Neutro Abs: 3.3 10*3/uL (ref 1.7–7.7)

## 2011-02-22 LAB — CBC
HCT: 38.7 % — ABNORMAL LOW (ref 39.0–52.0)
MCHC: 34.2 g/dL (ref 30.0–36.0)
Platelets: 207 10*3/uL (ref 150–400)
Platelets: 209 10*3/uL (ref 150–400)
RDW: 13.6 % (ref 11.5–15.5)
RDW: 12.8 % (ref 11.5–15.5)
WBC: 5 10*3/uL (ref 4.0–10.5)

## 2011-02-22 LAB — BASIC METABOLIC PANEL
BUN: 12 mg/dL (ref 6–23)
Calcium: 8.9 mg/dL (ref 8.4–10.5)
GFR calc non Af Amer: >60 mL/min (ref >60)
Potassium: 4.1 mEq/L (ref 3.5–5.1)
Sodium: 138 mEq/L (ref 135–145)

## 2011-02-22 LAB — PROTIME-INR
INR: 2.8 — ABNORMAL HIGH (ref 0.00–1.49)
INR: 2.8 — ABNORMAL HIGH (ref 0.00–1.49)
Prothrombin Time: 31.4 seconds — ABNORMAL HIGH (ref 11.6–15.2)

## 2011-02-22 LAB — CARDIAC PANEL(CRET KIN+CKTOT+MB+TROPI)
CK, MB: 1 ng/mL (ref 0.3–4.0)
CK, MB: 1.1 ng/mL (ref 0.3–4.0)
Relative Index: INVALID (ref 0.0–2.5)
Relative Index: INVALID (ref 0.0–2.5)
Total CK: 95 U/L (ref 7–232)
Total CK: 99 U/L (ref 7–232)
Troponin I: 0.01 ng/mL (ref 0.00–0.06)
Troponin I: <0.01 ng/mL (ref 0.00–0.06)

## 2011-02-22 LAB — COMPREHENSIVE METABOLIC PANEL
ALT: 22 U/L (ref 0–53)
Albumin: 3.2 g/dL — ABNORMAL LOW (ref 3.5–5.2)
Alkaline Phosphatase: 59 U/L (ref 39–117)
Calcium: 8.7 mg/dL (ref 8.4–10.5)
Potassium: 3.7 mEq/L (ref 3.5–5.1)
Sodium: 136 mEq/L (ref 135–145)
Total Protein: 5.8 g/dL — ABNORMAL LOW (ref 6.0–8.3)

## 2011-02-22 LAB — APTT
aPTT: 51 seconds — ABNORMAL HIGH (ref 24–37)
aPTT: 54 seconds — ABNORMAL HIGH (ref 24–37)

## 2011-03-11 ENCOUNTER — Encounter: Payer: 59 | Admitting: *Deleted

## 2011-03-18 ENCOUNTER — Ambulatory Visit (INDEPENDENT_AMBULATORY_CARE_PROVIDER_SITE_OTHER): Payer: 59 | Admitting: *Deleted

## 2011-03-18 DIAGNOSIS — Z7901 Long term (current) use of anticoagulants: Secondary | ICD-10-CM

## 2011-03-18 DIAGNOSIS — I2699 Other pulmonary embolism without acute cor pulmonale: Secondary | ICD-10-CM

## 2011-03-18 DIAGNOSIS — I4891 Unspecified atrial fibrillation: Secondary | ICD-10-CM

## 2011-03-18 LAB — POCT INR: INR: 2.1

## 2011-04-29 ENCOUNTER — Encounter: Payer: 59 | Admitting: *Deleted

## 2011-04-29 ENCOUNTER — Telehealth: Payer: Self-pay

## 2011-04-29 DIAGNOSIS — Z1289 Encounter for screening for malignant neoplasm of other sites: Secondary | ICD-10-CM

## 2011-04-29 DIAGNOSIS — Z0000 Encounter for general adult medical examination without abnormal findings: Secondary | ICD-10-CM

## 2011-04-29 NOTE — Telephone Encounter (Signed)
Put order in for physical labs. 

## 2011-04-30 ENCOUNTER — Ambulatory Visit (INDEPENDENT_AMBULATORY_CARE_PROVIDER_SITE_OTHER): Payer: 59 | Admitting: *Deleted

## 2011-04-30 DIAGNOSIS — I4891 Unspecified atrial fibrillation: Secondary | ICD-10-CM

## 2011-04-30 DIAGNOSIS — Z7901 Long term (current) use of anticoagulants: Secondary | ICD-10-CM

## 2011-04-30 DIAGNOSIS — I2699 Other pulmonary embolism without acute cor pulmonale: Secondary | ICD-10-CM

## 2011-04-30 LAB — POCT INR: INR: 2.1

## 2011-05-24 ENCOUNTER — Other Ambulatory Visit: Payer: Self-pay | Admitting: Internal Medicine

## 2011-05-24 ENCOUNTER — Other Ambulatory Visit (INDEPENDENT_AMBULATORY_CARE_PROVIDER_SITE_OTHER): Payer: 59

## 2011-05-24 DIAGNOSIS — Z1289 Encounter for screening for malignant neoplasm of other sites: Secondary | ICD-10-CM

## 2011-05-24 DIAGNOSIS — Z0000 Encounter for general adult medical examination without abnormal findings: Secondary | ICD-10-CM

## 2011-05-24 LAB — URINALYSIS, ROUTINE W REFLEX MICROSCOPIC
Bilirubin Urine: NEGATIVE
Hgb urine dipstick: NEGATIVE
Ketones, ur: NEGATIVE
Total Protein, Urine: NEGATIVE
pH: 6.5 (ref 5.0–8.0)

## 2011-05-24 LAB — HEPATIC FUNCTION PANEL
ALT: 28 U/L (ref 0–53)
AST: 21 U/L (ref 0–37)
Bilirubin, Direct: 0.1 mg/dL (ref 0.0–0.3)
Total Bilirubin: 1 mg/dL (ref 0.3–1.2)
Total Protein: 6.8 g/dL (ref 6.0–8.3)

## 2011-05-24 LAB — CBC WITH DIFFERENTIAL/PLATELET
Basophils Relative: 0.7 % (ref 0.0–3.0)
Eosinophils Absolute: 0.2 10*3/uL (ref 0.0–0.7)
Hemoglobin: 13.3 g/dL (ref 13.0–17.0)
Lymphocytes Relative: 34.5 % (ref 12.0–46.0)
MCHC: 34.2 g/dL (ref 30.0–36.0)
Monocytes Relative: 7.2 % (ref 3.0–12.0)
Neutro Abs: 3.3 10*3/uL (ref 1.4–7.7)
Neutrophils Relative %: 54.8 % (ref 43.0–77.0)
RBC: 4.41 Mil/uL (ref 4.22–5.81)
WBC: 6 10*3/uL (ref 4.5–10.5)

## 2011-05-24 LAB — TSH: TSH: 3.19 u[IU]/mL (ref 0.35–5.50)

## 2011-05-24 LAB — BASIC METABOLIC PANEL
BUN: 12 mg/dL (ref 6–23)
CO2: 32 mEq/L (ref 19–32)
Chloride: 103 mEq/L (ref 96–112)
Creatinine, Ser: 1 mg/dL (ref 0.4–1.5)
Potassium: 4.4 mEq/L (ref 3.5–5.1)

## 2011-05-24 LAB — LIPID PANEL
Cholesterol: 211 mg/dL — ABNORMAL HIGH (ref 0–200)
HDL: 37.9 mg/dL — ABNORMAL LOW (ref >39.00)
Total CHOL/HDL Ratio: 6
VLDL: 86.6 mg/dL — ABNORMAL HIGH (ref 0.0–40.0)

## 2011-05-25 ENCOUNTER — Encounter: Payer: Self-pay | Admitting: Internal Medicine

## 2011-05-25 DIAGNOSIS — Z0000 Encounter for general adult medical examination without abnormal findings: Secondary | ICD-10-CM | POA: Insufficient documentation

## 2011-05-28 ENCOUNTER — Encounter: Payer: Self-pay | Admitting: Internal Medicine

## 2011-05-28 ENCOUNTER — Ambulatory Visit (INDEPENDENT_AMBULATORY_CARE_PROVIDER_SITE_OTHER): Payer: 59 | Admitting: Internal Medicine

## 2011-05-28 VITALS — BP 108/80 | HR 65 | Temp 98.0°F | Ht 75.0 in | Wt 299.2 lb

## 2011-05-28 DIAGNOSIS — N529 Male erectile dysfunction, unspecified: Secondary | ICD-10-CM | POA: Insufficient documentation

## 2011-05-28 DIAGNOSIS — Z0000 Encounter for general adult medical examination without abnormal findings: Secondary | ICD-10-CM

## 2011-05-28 HISTORY — DX: Male erectile dysfunction, unspecified: N52.9

## 2011-05-28 MED ORDER — METOPROLOL SUCCINATE ER 25 MG PO TB24: 25.0000 mg | ORAL_TABLET | Freq: Every day | ORAL | Status: DC

## 2011-05-28 MED ORDER — VARDENAFIL HCL 20 MG PO TABS: 20.0000 mg | ORAL_TABLET | ORAL | Status: DC | PRN

## 2011-05-28 NOTE — Assessment & Plan Note (Signed)
For levitra prn,  to f/u any worsening symptoms or concerns  

## 2011-05-28 NOTE — Progress Notes (Signed)
Subjective:    Patient ID: Justin Woodard, male    DOB: 04-15-1956, 55 y.o.   MRN: [id number]  HPI  Here for wellness and f/u;  Overall doing ok;  Pt denies CP, worsening SOB, DOE, wheezing, orthopnea, PND, worsening LE edema, palpitations, dizziness or syncope.  Pt denies neurological change such as new Headache, facial or extremity weakness.  Pt denies polydipsia, polyuria, or low sugar symptoms. Pt states overall good compliance with treatment and medications, good tolerability, and trying to follow lower cholesterol diet.  Pt denies worsening depressive symptoms, suicidal ideation or panic. No fever, wt loss, night sweats, loss of appetite, or other constitutional symptoms.  Pt states good ability with ADL's, low fall risk, home safety reviewed and adequate, no significant changes in hearing or vision, and occasionally active with exercise.  Does also have some ongoing ED symptoms worsening in the past yr. Past Medical History  Diagnosis Date  . DVT (deep venous thrombosis)     2000  . Pulmonary embolism   . A-fib     post PE only  . GERD (gastroesophageal reflux disease)   . Hypertension   . Anxiety   . Symptomatic PVCs   . Erectile dysfunction 05/28/2011   Past Surgical History  Procedure Date  . Vein surgery     RLE saphenous vein stripping  . Tonsillectomy     reports that he has quit smoking. He does not have any smokeless tobacco history on file. He reports that he does not drink alcohol or use illicit drugs. family history includes Diabetes in an unspecified family member and Heart attack in his father. No Known Allergies Current Outpatient Prescriptions on File Prior to Visit  Medication Sig Dispense Refill  . warfarin (COUMADIN) 4 MG tablet TAKE AS DIRECTED BY COUMADIN CLINIC  180 tablet  1   Review of Systems Review of Systems  Constitutional: Negative for diaphoresis, activity change, appetite change and unexpected weight change.  HENT: Negative for hearing loss, ear  pain, facial swelling, mouth sores and neck stiffness.   Eyes: Negative for pain, redness and visual disturbance.  Respiratory: Negative for shortness of breath and wheezing.   Cardiovascular: Negative for chest pain and palpitations.  Gastrointestinal: Negative for diarrhea, blood in stool, abdominal distention and rectal pain.  Genitourinary: Negative for hematuria, flank pain and decreased urine volume.  Musculoskeletal: Negative for myalgias and joint swelling.  Skin: Negative for color change and wound.  Neurological: Negative for syncope and numbness.  Hematological: Negative for adenopathy.  Psychiatric/Behavioral: Negative for hallucinations, self-injury, decreased concentration and agitation.      Objective:   Physical Exam BP 108/80  Pulse 65  Temp(Src) 98 F (36.7 C) (Oral)  Ht 6\' 3"  (1.905 m)  Wt 299 lb 4 oz (135.739 kg)  BMI 37.40 kg/m2  SpO2 97% Physical Exam  VS noted Constitutional: Pt is oriented to person, place, and time. Appears well-developed and well-nourished.  HENT:  Head: Normocephalic and atraumatic.  Right Ear: External ear normal.  Left Ear: External ear normal.  Nose: Nose normal.  Mouth/Throat: Oropharynx is clear and moist.  Eyes: Conjunctivae and EOM are normal. Pupils are equal, round, and reactive to light.  Neck: Normal range of motion. Neck supple. No JVD present. No tracheal deviation present.  Cardiovascular: Normal rate, regular rhythm, normal heart sounds and intact distal pulses.   Pulmonary/Chest: Effort normal and breath sounds normal.  Abdominal: Soft. Bowel sounds are normal. There is no tenderness.  Musculoskeletal: Normal range of  motion. Exhibits no edema.  Lymphadenopathy:  Has no cervical adenopathy.  Neurological: Pt is alert and oriented to person, place, and time. Pt has normal reflexes. No cranial nerve deficit.  Skin: Skin is warm and dry. No rash noted.  Psychiatric:  Has  normal mood and affect. Behavior is normal.       Assessment & Plan:

## 2011-05-28 NOTE — Patient Instructions (Signed)
Take all new medications as prescribed Continue all other medications as before Please call if you change your mind about the flu shot, tetanus, or colonoscopy Please return in 1 year for your yearly visit, or sooner if needed, with Lab testing done 3-5 days before

## 2011-05-28 NOTE — Assessment & Plan Note (Addendum)
Overall doing well, age appropriate education and counseling updated, referrals for preventative services and immunizations addressed, dietary and smoking counseling addressed, most recent labs and ECG reviewed.  I have personally reviewed and have noted: 1) the patient's medical and social history 2) The pt's use of alcohol, tobacco, and illicit drugs 3) The patient's current medications and supplements 4) Functional ability including ADL's, fall risk, home safety risk, hearing and visual impairment 5) Diet and physical activities 6) Evidence for depression or mood disorder 7) The patient's height, weight, and BMI have been recorded in the chart I have made referrals, and provided counseling and education based on review of the above Declines immunization or colonoscopy ECG reviewed as per emr

## 2011-06-10 ENCOUNTER — Ambulatory Visit (INDEPENDENT_AMBULATORY_CARE_PROVIDER_SITE_OTHER): Payer: 59 | Admitting: *Deleted

## 2011-06-10 DIAGNOSIS — I2699 Other pulmonary embolism without acute cor pulmonale: Secondary | ICD-10-CM

## 2011-06-10 DIAGNOSIS — Z7901 Long term (current) use of anticoagulants: Secondary | ICD-10-CM

## 2011-06-10 DIAGNOSIS — I4891 Unspecified atrial fibrillation: Secondary | ICD-10-CM

## 2011-06-10 LAB — POCT INR: INR: 2.4

## 2011-07-22 ENCOUNTER — Ambulatory Visit (INDEPENDENT_AMBULATORY_CARE_PROVIDER_SITE_OTHER): Payer: 59 | Admitting: Pharmacist

## 2011-07-22 DIAGNOSIS — I4891 Unspecified atrial fibrillation: Secondary | ICD-10-CM

## 2011-07-22 DIAGNOSIS — I2699 Other pulmonary embolism without acute cor pulmonale: Secondary | ICD-10-CM

## 2011-07-22 DIAGNOSIS — Z7901 Long term (current) use of anticoagulants: Secondary | ICD-10-CM

## 2011-08-18 ENCOUNTER — Other Ambulatory Visit: Payer: Self-pay | Admitting: Internal Medicine

## 2011-09-02 ENCOUNTER — Ambulatory Visit (INDEPENDENT_AMBULATORY_CARE_PROVIDER_SITE_OTHER): Payer: 59 | Admitting: Pharmacist

## 2011-09-02 DIAGNOSIS — I2699 Other pulmonary embolism without acute cor pulmonale: Secondary | ICD-10-CM

## 2011-09-02 DIAGNOSIS — Z7901 Long term (current) use of anticoagulants: Secondary | ICD-10-CM

## 2011-09-02 DIAGNOSIS — I4891 Unspecified atrial fibrillation: Secondary | ICD-10-CM

## 2011-10-15 ENCOUNTER — Ambulatory Visit (INDEPENDENT_AMBULATORY_CARE_PROVIDER_SITE_OTHER): Payer: 59 | Admitting: Pharmacist

## 2011-10-15 DIAGNOSIS — I4891 Unspecified atrial fibrillation: Secondary | ICD-10-CM

## 2011-10-15 DIAGNOSIS — Z7901 Long term (current) use of anticoagulants: Secondary | ICD-10-CM

## 2011-10-15 DIAGNOSIS — I2699 Other pulmonary embolism without acute cor pulmonale: Secondary | ICD-10-CM

## 2011-10-15 LAB — POCT INR: INR: 2.2

## 2011-10-19 ENCOUNTER — Emergency Department (HOSPITAL_COMMUNITY)
Admission: EM | Admit: 2011-10-19 | Discharge: 2011-10-19 | Disposition: A | Discharge status: Home / Self Care | Payer: 59 | Attending: Emergency Medicine | Admitting: Emergency Medicine

## 2011-10-19 ENCOUNTER — Emergency Department (HOSPITAL_COMMUNITY): Payer: 59

## 2011-10-19 ENCOUNTER — Encounter (HOSPITAL_COMMUNITY): Payer: Self-pay | Admitting: *Deleted

## 2011-10-19 DIAGNOSIS — Z7901 Long term (current) use of anticoagulants: Secondary | ICD-10-CM | POA: Insufficient documentation

## 2011-10-19 DIAGNOSIS — I10 Essential (primary) hypertension: Secondary | ICD-10-CM | POA: Insufficient documentation

## 2011-10-19 DIAGNOSIS — Z86711 Personal history of pulmonary embolism: Secondary | ICD-10-CM | POA: Insufficient documentation

## 2011-10-19 DIAGNOSIS — Z79899 Other long term (current) drug therapy: Secondary | ICD-10-CM | POA: Insufficient documentation

## 2011-10-19 DIAGNOSIS — Z86718 Personal history of other venous thrombosis and embolism: Secondary | ICD-10-CM | POA: Insufficient documentation

## 2011-10-19 DIAGNOSIS — I4949 Other premature depolarization: Secondary | ICD-10-CM | POA: Insufficient documentation

## 2011-10-19 DIAGNOSIS — K219 Gastro-esophageal reflux disease without esophagitis: Secondary | ICD-10-CM | POA: Insufficient documentation

## 2011-10-19 DIAGNOSIS — R079 Chest pain, unspecified: Secondary | ICD-10-CM

## 2011-10-19 DIAGNOSIS — F411 Generalized anxiety disorder: Secondary | ICD-10-CM | POA: Insufficient documentation

## 2011-10-19 LAB — DIFFERENTIAL
Basophils Absolute: 0 10*3/uL (ref 0.0–0.1)
Basophils Relative: 1 % (ref 0–1)
Eosinophils Absolute: 0.1 10*3/uL (ref 0.0–0.7)
Eosinophils Relative: 1 % (ref 0–5)
Monocytes Absolute: 0.5 10*3/uL (ref 0.1–1.0)
Monocytes Relative: 8 % (ref 3–12)

## 2011-10-19 LAB — CBC
HCT: 38.8 % — ABNORMAL LOW (ref 39.0–52.0)
Hemoglobin: 13.5 g/dL (ref 13.0–17.0)
MCH: 29.5 pg (ref 26.0–34.0)
MCHC: 34.8 g/dL (ref 30.0–36.0)

## 2011-10-19 LAB — COMPREHENSIVE METABOLIC PANEL
AST: 21 U/L (ref 0–37)
Albumin: 3.7 g/dL (ref 3.5–5.2)
BUN: 10 mg/dL (ref 6–23)
Calcium: 9.7 mg/dL (ref 8.4–10.5)
Chloride: 101 mEq/L (ref 96–112)
Creatinine, Ser: 0.99 mg/dL (ref 0.50–1.35)
Total Bilirubin: 0.8 mg/dL (ref 0.3–1.2)
Total Protein: 7.1 g/dL (ref 6.0–8.3)

## 2011-10-19 LAB — CK TOTAL AND CKMB (NOT AT ARMC)
CK, MB: 2.3 ng/mL (ref 0.3–4.0)
Relative Index: 1.8 (ref 0.0–2.5)
Total CK: 125 U/L (ref 7–232)

## 2011-10-19 LAB — PROTIME-INR: Prothrombin Time: 23.8 seconds — ABNORMAL HIGH (ref 11.6–15.2)

## 2011-10-19 NOTE — ED Notes (Signed)
The pt has also had some lower leg pain for several days.  History of dvts with varicose veins

## 2011-10-19 NOTE — ED Provider Notes (Signed)
History     CSN: [id number]  Arrival date & time 10/19/11  4098   First MD Initiated Contact with Patient 10/19/11 [phone]      Chief Complaint  Patient presents with  . Shortness of Breath     The history is provided by the patient and medical records.   The patient reports several days of discomfort in his left lower chest that is worse when he takes a deep breath.  He reports that occasionally makes him for short of breath but not all the time.  He does have a history of pulmonary embolism and DVT is currently on Coumadin.  His last INR was therapeutic.  He has not tried anything for the pain.  He reports he had difficulty sleeping last night secondary to the pain in his left lower chest is worse when he takes deep breath.  His symptoms are not worsened by food or exertion.  He has no shortness of breath at this time.  He finds that he come to the emergency department for evaluation.  He is a history of atrial fibrillation after his pulmonary embolism.  He has a prior history of DVT.  He has a history of GERD hypertension anxiety and symptomatic PVCs.  He denies fevers or chills.  He's had no cough or congestion.  Denies unilateral leg swelling.  He has had no recent long travel.  Denies diaphoresis.  No radiation of his discomfort.  He has no prior history of coronary artery disease  Past Medical History  Diagnosis Date  . DVT (deep venous thrombosis)     2000  . Pulmonary embolism   . A-fib     post PE only  . GERD (gastroesophageal reflux disease)   . Hypertension   . Anxiety   . Symptomatic PVCs   . Erectile dysfunction 05/28/2011    Past Surgical History  Procedure Date  . Vein surgery     RLE saphenous vein stripping  . Tonsillectomy     Family History  Problem Relation Age of Onset  . Diabetes      first degree relatives  . Heart attack Father     History  Substance Use Topics  . Smoking status: Former Games developer  . Smokeless tobacco: Not on file  . Alcohol Use: No        Review of Systems  Respiratory: Positive for shortness of breath.   All other systems reviewed and are negative.    Allergies  Review of patient's allergies indicates no known allergies.  Home Medications   Current Outpatient Rx  Name Route Sig Dispense Refill  . METOPROLOL SUCCINATE ER 25 MG PO TB24 Oral Take 1 tablet (25 mg total) by mouth daily. 90 tablet 3  . WARFARIN SODIUM 4 MG PO TABS Oral Take 6-8 mg by mouth daily. Takes 2 tabs daily except Monday and Friday On Monday and Friday takes 1 1/2 tablets      BP 155/89  Pulse 76  Temp(Src) 98.1 F (36.7 C) (Oral)  Resp 20  SpO2 98%  Physical Exam  Nursing note and vitals reviewed. Constitutional: He is oriented to person, place, and time. He appears well-developed and well-nourished.  HENT:  Head: Normocephalic and atraumatic.  Eyes: EOM are normal.  Neck: Normal range of motion.  Cardiovascular: Normal rate, regular rhythm, normal heart sounds and intact distal pulses.   Pulmonary/Chest: Effort normal and breath sounds normal. No respiratory distress. He exhibits no tenderness.  Abdominal: Soft. He exhibits no  distension. There is no tenderness.  Musculoskeletal: Normal range of motion.  Neurological: He is alert and oriented to person, place, and time.  Skin: Skin is warm and dry.  Psychiatric: Judgment normal.       Anxious    ED Course  Procedures    Date: 10/19/2011  Rate: 78  Rhythm: normal sinus rhythm  QRS Axis: normal  Intervals: normal  ST/T Wave abnormalities: normal  Conduction Disutrbances: none  Narrative Interpretation:   Old EKG Reviewed: No significant changes noted     Labs Reviewed  CBC - Abnormal; Notable for the following:    HCT 38.8 (*)    All other components within normal limits  COMPREHENSIVE METABOLIC PANEL - Abnormal; Notable for the following:    Glucose, Bld 116 (*)    GFR calc non Af Amer 88 (*)    All other components within normal limits  PROTIME-INR -  Abnormal; Notable for the following:    Prothrombin Time 23.8 (*)    INR 2.09 (*)    All other components within normal limits  DIFFERENTIAL  CK TOTAL AND CKMB  TROPONIN I   Dg Chest 2 View  10/19/2011  *RADIOLOGY REPORT*  Clinical Data: Shortness of breath and chest discomfort.  The patient takes Coumadin for prior pulmonary emboli.  CHEST - 2 VIEW  Comparison: 11/07/2009  Findings: There is normal heart size and pulmonary vascularity. Mild hyperinflation.  No focal airspace consolidation in the lungs. No blunting of costophrenic angles.  No pneumothorax.  Tortuous aorta. There are degenerative changes in the spine.  No significant changes since the previous study.  IMPRESSION: No evidence of active pulmonary disease.  Original Report Authenticated By: Marlon Pel, M.D.     1. Chest pain       MDM  The patient's INR is therapeutic.  I don't believe that the patient threw a new pulmonary emboli.  His only risk factor for coronary artery disease is hypertension.  I don't believe this to be ACS.  The patient is extremely anxious.  His EKG in his troponin and chest x-ray normal.  Without any intervention the patient reports resolution of his discomfort.  Discharge home in good condition with PCP and cardiology followup.  The patient understands return the emergency apartment for new or worsening symptoms        Lyanne Co, MD 10/19/11 [phone]

## 2011-10-19 NOTE — ED Notes (Signed)
Since midnight he has had sob sl chest discomfort.  The chest discomfort he has had for 3-4 days no nv or dizziness .  He feel ike he is going to pass out

## 2011-11-20 ENCOUNTER — Ambulatory Visit (INDEPENDENT_AMBULATORY_CARE_PROVIDER_SITE_OTHER): Payer: 59 | Admitting: *Deleted

## 2011-11-20 DIAGNOSIS — I4891 Unspecified atrial fibrillation: Secondary | ICD-10-CM

## 2011-11-20 DIAGNOSIS — Z7901 Long term (current) use of anticoagulants: Secondary | ICD-10-CM

## 2011-11-20 DIAGNOSIS — I2699 Other pulmonary embolism without acute cor pulmonale: Secondary | ICD-10-CM

## 2011-12-30 ENCOUNTER — Ambulatory Visit (INDEPENDENT_AMBULATORY_CARE_PROVIDER_SITE_OTHER): Payer: 59 | Admitting: Pharmacist

## 2011-12-30 DIAGNOSIS — I4891 Unspecified atrial fibrillation: Secondary | ICD-10-CM

## 2011-12-30 DIAGNOSIS — Z7901 Long term (current) use of anticoagulants: Secondary | ICD-10-CM

## 2011-12-30 DIAGNOSIS — I2699 Other pulmonary embolism without acute cor pulmonale: Secondary | ICD-10-CM

## 2012-01-22 ENCOUNTER — Encounter: Payer: Self-pay | Admitting: Pharmacist

## 2012-02-06 ENCOUNTER — Encounter: Payer: Self-pay | Admitting: Internal Medicine

## 2012-02-06 ENCOUNTER — Ambulatory Visit (INDEPENDENT_AMBULATORY_CARE_PROVIDER_SITE_OTHER): Payer: 59 | Admitting: General Practice

## 2012-02-06 ENCOUNTER — Ambulatory Visit (INDEPENDENT_AMBULATORY_CARE_PROVIDER_SITE_OTHER): Payer: 59 | Admitting: Internal Medicine

## 2012-02-06 VITALS — BP 128/78 | HR 62 | Temp 97.6°F | Ht 75.0 in | Wt 297.1 lb

## 2012-02-06 DIAGNOSIS — I2699 Other pulmonary embolism without acute cor pulmonale: Secondary | ICD-10-CM

## 2012-02-06 DIAGNOSIS — Z7901 Long term (current) use of anticoagulants: Secondary | ICD-10-CM

## 2012-02-06 DIAGNOSIS — B029 Zoster without complications: Secondary | ICD-10-CM | POA: Insufficient documentation

## 2012-02-06 DIAGNOSIS — I4891 Unspecified atrial fibrillation: Secondary | ICD-10-CM

## 2012-02-06 DIAGNOSIS — J069 Acute upper respiratory infection, unspecified: Secondary | ICD-10-CM | POA: Insufficient documentation

## 2012-02-06 DIAGNOSIS — I10 Essential (primary) hypertension: Secondary | ICD-10-CM

## 2012-02-06 HISTORY — DX: Zoster without complications: B02.9

## 2012-02-06 LAB — POCT INR: INR: 2.7

## 2012-02-06 MED ORDER — TRAMADOL HCL 50 MG PO TABS: 50.0000 mg | ORAL_TABLET | Freq: Four times a day (QID) | ORAL | Status: DC | PRN

## 2012-02-06 MED ORDER — AZITHROMYCIN 250 MG PO TABS: ORAL_TABLET | ORAL | Status: DC

## 2012-02-06 MED ORDER — VALACYCLOVIR HCL 1 G PO TABS: 1000.0000 mg | ORAL_TABLET | Freq: Three times a day (TID) | ORAL | Status: DC

## 2012-02-06 NOTE — Patient Instructions (Addendum)
Take all new medications as prescribed Continue all other medications as before Please consider changing your coumadin checks to our office Please remember to sign up for My Chart at your earliest convenience, as this will be important to you in the future with finding out test results. Please return in January 2014 with Lab testing done 3-5 days before

## 2012-02-08 ENCOUNTER — Encounter: Payer: Self-pay | Admitting: Internal Medicine

## 2012-02-08 NOTE — Progress Notes (Signed)
  Subjective:    Patient ID: Justin Woodard, male    DOB: 1955-08-14, 56 y.o.   MRN: [id number]  HPI   Here with 3 days acute onset fever, facial pain, pressure, general weakness and malaise, and greenish d/c, with slight ST, but little to no cough and Pt denies chest pain, increased sob or doe, wheezing, orthopnea, PND, increased LE swelling, palpitations, dizziness or syncope.  Also with 2-3 days onset right sided back/side/chest rash with bumps and mild to mod burgning constant pain.  Pt denies new neurological symptoms such as new headache, or facial or extremity weakness or numbness   Pt denies polydipsia, polyuria.   Pt denies fever, wt loss, night sweats, loss of appetite, or other constitutional symptoms except for the above Past Medical History  Diagnosis Date  . DVT (deep venous thrombosis)     2000  . Pulmonary embolism   . A-fib     post PE only  . GERD (gastroesophageal reflux disease)   . Hypertension   . Anxiety   . Symptomatic PVCs   . Erectile dysfunction 05/28/2011   Past Surgical History  Procedure Date  . Vein surgery     RLE saphenous vein stripping  . Tonsillectomy     reports that he has quit smoking. He does not have any smokeless tobacco history on file. He reports that he does not drink alcohol or use illicit drugs. family history includes Diabetes in an unspecified family member and Heart attack in his father. No Known Allergies Current Outpatient Prescriptions on File Prior to Visit  Medication Sig Dispense Refill  . metoprolol succinate (TOPROL-XL) 25 MG 24 hr tablet Take 1 tablet (25 mg total) by mouth daily.  90 tablet  3  . warfarin (COUMADIN) 4 MG tablet Take 6-8 mg by mouth daily. Takes 2 tabs daily except Monday and Friday On Monday and Friday takes 1 1/2 tablets       Review of Systems  Constitutional: Negative for diaphoresis and unexpected weight change.  HENT: Negative for tinnitus.   Eyes: Negative for photophobia and visual disturbance.    Respiratory: Negative for choking and stridor.   Gastrointestinal: Negative for vomiting and blood in stool.  Genitourinary: Negative for hematuria and decreased urine volume.  Musculoskeletal: Negative for gait problem.  Skin: Negative for color change and wound.  Neurological: Negative for tremors and numbness.  Psychiatric/Behavioral: Negative for decreased concentration. The patient is not hyperactive.       Objective:   Physical Exam BP 128/78  Pulse 62  Temp 97.6 F (36.4 C) (Oral)  Ht 6\' 3"  (1.905 m)  Wt 297 lb 2 oz (134.775 kg)  BMI 37.14 kg/m2  SpO2 97% Physical Exam  VS noted,m mild ill Constitutional: Pt appears well-developed and well-nourished.  HENT: Head: Normocephalic.  Right Ear: External ear normal.  Left Ear: External ear normal.  Bilat tm's mild erythema.  Sinus nontender.  Pharynx mild erythema Eyes: Conjunctivae and EOM are normal. Pupils are equal, round, and reactive to light.  Neck: Normal range of motion. Neck supple.  Cardiovascular: Normal rate and regular rhythm.   Pulmonary/Chest: Effort normal and breath sounds normal.  Neurological: Pt is alert. Not confused  Skin: Skin is warm. No erythema. except for typical large area dermatomal approx t5 level right side grouped vesicles on erythema base rash Psychiatric: Pt behavior is normal. Thought content normal.     Assessment & Plan:

## 2012-02-08 NOTE — Assessment & Plan Note (Signed)
Mild to mod, for antibx course,  to f/u any worsening symptoms or concerns 

## 2012-02-08 NOTE — Assessment & Plan Note (Signed)
stable overall by hx and exam, most recent data reviewed with pt, and pt to continue medical treatment as before BP Readings from Last 3 Encounters:  02/06/12 128/78  10/19/11 134/83  05/28/11 108/80

## 2012-02-17 ENCOUNTER — Telehealth: Payer: Self-pay

## 2012-02-17 NOTE — Telephone Encounter (Signed)
The patient was seen on 02/06/12 for URI and shingles.  [employer] (patients employer) would like a return to work note to return 02/18/12.  Please include any modification to work duty if needed.  Fax number for [employer] [place] [phone] Attn. Noralee Chars.  Please advise.

## 2012-02-17 NOTE — Telephone Encounter (Signed)
Ok for note;  To robin to help, no work restrictions needed

## 2012-02-18 NOTE — Telephone Encounter (Signed)
Letter completed and faxed to requested number.

## 2012-02-26 ENCOUNTER — Ambulatory Visit (INDEPENDENT_AMBULATORY_CARE_PROVIDER_SITE_OTHER): Payer: 59 | Admitting: Internal Medicine

## 2012-02-26 ENCOUNTER — Encounter: Payer: Self-pay | Admitting: Internal Medicine

## 2012-02-26 VITALS — BP 122/78 | HR 71 | Ht 75.0 in | Wt 297.0 lb

## 2012-02-26 DIAGNOSIS — I10 Essential (primary) hypertension: Secondary | ICD-10-CM

## 2012-02-26 DIAGNOSIS — I4891 Unspecified atrial fibrillation: Secondary | ICD-10-CM

## 2012-02-26 DIAGNOSIS — Z8672 Personal history of thrombophlebitis: Secondary | ICD-10-CM

## 2012-02-26 NOTE — Assessment & Plan Note (Signed)
He is maintaining sinus rhythm very nicely. He has had no symptomatic atrial fibrillation. He will continue his current medical therapy.

## 2012-02-26 NOTE — Assessment & Plan Note (Signed)
He will continue his current dose of anticoagulation.

## 2012-02-26 NOTE — Patient Instructions (Addendum)
Your physician wants you to follow-up in: 12 months with Dr. Taylor. You will receive a reminder letter in the mail two months in advance. If you don't receive a letter, please call our office to schedule the follow-up appointment.    

## 2012-02-26 NOTE — Assessment & Plan Note (Signed)
His blood pressure is well controlled. He will continue his current medical therapy. He is instructed to lose weight and maintain a low-sodium diet.

## 2012-02-26 NOTE — Progress Notes (Signed)
HPI Justin Woodard returns today after a long absence from our cardiology clinic. He is a very pleasant 56 year old man with a history of pulmonary embolism, and paroxysmal atrial fibrillation after his embolism. He has done well in the interim. He denies chest pain, shortness of breath, or syncope. He does have chronic intermittent peripheral edema. He is obese. He notes occasional discomfort in his calves.  No Known Allergies   Current Outpatient Prescriptions  Medication Sig Dispense Refill  . metoprolol succinate (TOPROL-XL) 25 MG 24 hr tablet Take 1 tablet (25 mg total) by mouth daily.  90 tablet  3  . traMADol (ULTRAM) 50 MG tablet Take 1 tablet (50 mg total) by mouth every 6 (six) hours as needed for pain.  60 tablet  1  . warfarin (COUMADIN) 4 MG tablet Take 6-8 mg by mouth daily. Takes 2 tabs daily except Monday and Friday On Monday and Friday takes 1 1/2 tablets         Past Medical History  Diagnosis Date  . DVT (deep venous thrombosis)     2000  . Pulmonary embolism   . A-fib     post PE only  . GERD (gastroesophageal reflux disease)   . Hypertension   . Anxiety   . Symptomatic PVCs   . Erectile dysfunction 05/28/2011    ROS:   All systems reviewed and negative except as noted in the HPI.   Past Surgical History  Procedure Date  . Vein surgery     RLE saphenous vein stripping  . Tonsillectomy      Family History  Problem Relation Age of Onset  . Diabetes      first degree relatives  . Heart attack Father      History   Social History  . Marital Status: Married    Spouse Name: N/A    Number of Children: 2  . Years of Education: N/A   Occupational History  . PARTS DEPT [employer]   Social History Main Topics  . Smoking status: Former Games developer  . Smokeless tobacco: Not on file  . Alcohol Use: No  . Drug Use: No  . Sexually Active:    Other Topics Concern  . 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  HEART:  Regular rate rhythm, no murmurs, no rubs, no clicks Abd:  soft, positive bowel sounds, no organomegally, no rebound, no guarding Ext:  2 plus pulses, 1+  edema, no cyanosis, no clubbing Skin:  No rashes no nodules Neuro:  CN II through XII intact, motor grossly intact  EKG  normal sinus rhythm   Assess/Plan:

## 2012-03-01 ENCOUNTER — Other Ambulatory Visit: Payer: Self-pay | Admitting: Internal Medicine

## 2012-03-06 DIAGNOSIS — Z0279 Encounter for issue of other medical certificate: Secondary | ICD-10-CM

## 2012-03-19 ENCOUNTER — Ambulatory Visit (INDEPENDENT_AMBULATORY_CARE_PROVIDER_SITE_OTHER): Payer: 59 | Admitting: General Practice

## 2012-03-19 DIAGNOSIS — I2699 Other pulmonary embolism without acute cor pulmonale: Secondary | ICD-10-CM

## 2012-03-19 DIAGNOSIS — Z7901 Long term (current) use of anticoagulants: Secondary | ICD-10-CM

## 2012-03-19 DIAGNOSIS — I4891 Unspecified atrial fibrillation: Secondary | ICD-10-CM

## 2012-03-19 LAB — POCT INR: INR: 2.2

## 2012-04-30 ENCOUNTER — Ambulatory Visit (INDEPENDENT_AMBULATORY_CARE_PROVIDER_SITE_OTHER): Payer: 59 | Admitting: General Practice

## 2012-04-30 DIAGNOSIS — Z7901 Long term (current) use of anticoagulants: Secondary | ICD-10-CM

## 2012-04-30 DIAGNOSIS — I2699 Other pulmonary embolism without acute cor pulmonale: Secondary | ICD-10-CM

## 2012-04-30 DIAGNOSIS — I4891 Unspecified atrial fibrillation: Secondary | ICD-10-CM

## 2012-06-06 ENCOUNTER — Other Ambulatory Visit: Payer: Self-pay | Admitting: Internal Medicine

## 2012-06-10 ENCOUNTER — Ambulatory Visit (INDEPENDENT_AMBULATORY_CARE_PROVIDER_SITE_OTHER): Payer: 59 | Admitting: General Practice

## 2012-06-10 DIAGNOSIS — I4891 Unspecified atrial fibrillation: Secondary | ICD-10-CM

## 2012-06-10 DIAGNOSIS — I2699 Other pulmonary embolism without acute cor pulmonale: Secondary | ICD-10-CM

## 2012-06-10 DIAGNOSIS — Z7901 Long term (current) use of anticoagulants: Secondary | ICD-10-CM

## 2012-06-10 LAB — POCT INR: INR: 2.4

## 2012-07-21 ENCOUNTER — Telehealth: Payer: Self-pay | Admitting: General Practice

## 2012-07-21 ENCOUNTER — Ambulatory Visit (INDEPENDENT_AMBULATORY_CARE_PROVIDER_SITE_OTHER): Payer: 59 | Admitting: General Practice

## 2012-07-21 DIAGNOSIS — I2699 Other pulmonary embolism without acute cor pulmonale: Secondary | ICD-10-CM

## 2012-07-21 LAB — POCT INR: INR: 2.2

## 2012-07-21 NOTE — Telephone Encounter (Signed)
Asdfjkl;asdfjkl;

## 2012-08-13 ENCOUNTER — Encounter: Payer: Self-pay | Admitting: Internal Medicine

## 2012-08-13 ENCOUNTER — Ambulatory Visit (INDEPENDENT_AMBULATORY_CARE_PROVIDER_SITE_OTHER): Payer: 59 | Admitting: Internal Medicine

## 2012-08-13 ENCOUNTER — Other Ambulatory Visit: Payer: Self-pay | Admitting: Internal Medicine

## 2012-08-13 VITALS — BP 130/80 | HR 61 | Temp 97.3°F | Ht 76.0 in | Wt 297.5 lb

## 2012-08-13 DIAGNOSIS — F411 Generalized anxiety disorder: Secondary | ICD-10-CM

## 2012-08-13 DIAGNOSIS — J209 Acute bronchitis, unspecified: Secondary | ICD-10-CM | POA: Insufficient documentation

## 2012-08-13 DIAGNOSIS — Z0000 Encounter for general adult medical examination without abnormal findings: Secondary | ICD-10-CM

## 2012-08-13 DIAGNOSIS — I10 Essential (primary) hypertension: Secondary | ICD-10-CM

## 2012-08-13 MED ORDER — AZITHROMYCIN 250 MG PO TABS: ORAL_TABLET | ORAL | Status: DC

## 2012-08-13 MED ORDER — HYDROCODONE-HOMATROPINE 5-1.5 MG/5ML PO SYRP: 5.0000 mL | ORAL_SOLUTION | Freq: Four times a day (QID) | ORAL | Status: DC | PRN

## 2012-08-13 MED ORDER — LEVOFLOXACIN 250 MG PO TABS: 250.0000 mg | ORAL_TABLET | Freq: Every day | ORAL | Status: DC

## 2012-08-13 NOTE — Assessment & Plan Note (Signed)
stable overall by history and exam, recent data reviewed with pt, and pt to continue medical treatment as before,  to f/u any worsening symptoms or concerns Lab Results  Component Value Date   WBC 5.8 10/19/2011   HGB 13.5 10/19/2011   HCT 38.8* 10/19/2011   PLT 212 10/19/2011   GLUCOSE 116* 10/19/2011   CHOL 211* 05/24/2011   TRIG 433.0* 05/24/2011   HDL 37.90* 05/24/2011   LDLDIRECT 85.7 05/24/2011   ALT 21 10/19/2011   AST 21 10/19/2011   NA 140 10/19/2011   K 3.7 10/19/2011   CL 101 10/19/2011   CREATININE 0.99 10/19/2011   BUN 10 10/19/2011   CO2 25 10/19/2011   TSH 3.19 05/24/2011   PSA 1.47 05/24/2011   INR 2.2 07/21/2012

## 2012-08-13 NOTE — Assessment & Plan Note (Signed)
Mild to mod, for antibx course,  to f/u any worsening symptoms or concerns 

## 2012-08-13 NOTE — Patient Instructions (Addendum)
Please take all new medication as prescribed - the antibiotic, and cough medicine Please continue all other medications as before Thank you for enrolling in MyChart. Please follow the instructions below to securely access your online medical record. MyChart allows you to send messages to your doctor, view your test results, renew your prescriptions, schedule appointments, and more. To Log into My Chart online, please go by [employer] or [employer] to [email or web address], or download the MyChart App from the [employer] .  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 Please keep your appointments with your specialists as you have planned - the coumadin clinic Please return in 3 months, or sooner if needed, with Lab testing done 3-5 days before

## 2012-08-13 NOTE — Progress Notes (Signed)
  Subjective:    Patient ID: Justin Woodard, male    DOB: Oct 05, 1955, 57 y.o.   MRN: [id number]  HPI  Here with acute onset mild to mod 2-3 days ST, HA, general weakness and malaise, with prod cough greenish sputum, but Pt denies chest pain, increased sob or doe, wheezing, orthopnea, PND, increased LE swelling, palpitations, dizziness or syncope. Denies worsening depressive symptoms, suicidal ideation, or panic.  Pt denies new neurological symptoms such as new headache, or facial or extremity weakness or numbness   Pt denies polydipsia, polyuria, .  Pt states overall good compliance with meds, trying to follow lower cholesterol diet Past Medical History  Diagnosis Date  . DVT (deep venous thrombosis)     2000  . Pulmonary embolism   . A-fib     post PE only  . GERD (gastroesophageal reflux disease)   . Hypertension   . Anxiety   . Symptomatic PVCs   . Erectile dysfunction 05/28/2011   Past Surgical History  Procedure Laterality Date  . Vein surgery      RLE saphenous vein stripping  . Tonsillectomy      reports that he has quit smoking. He does not have any smokeless tobacco history on file. He reports that he does not drink alcohol or use illicit drugs. family history includes Diabetes in an unspecified family member and Heart attack in his father. No Known Allergies Current Outpatient Prescriptions on File Prior to Visit  Medication Sig Dispense Refill  . COUMADIN 4 MG tablet TAKE AS DIRECTED BY COUMADIN CLINIC  180 tablet  1  . metoprolol succinate (TOPROL-XL) 25 MG 24 hr tablet TAKE 1 TABLET BY MOUTH DAILY  90 tablet  2  . warfarin (COUMADIN) 4 MG tablet Take 6-8 mg by mouth daily. Takes 2 tabs daily except Monday and Friday On Monday and Friday takes 1 1/2 tablets       No current facility-administered medications on file prior to visit.   Review of Systems  Constitutional: Negative for unexpected weight change, or unusual diaphoresis  HENT: Negative for tinnitus.   Eyes:  Negative for photophobia and visual disturbance.  Respiratory: Negative for choking and stridor.   Gastrointestinal: Negative for vomiting and blood in stool.  Genitourinary: Negative for hematuria and decreased urine volume.  Musculoskeletal: Negative for acute joint swelling Skin: Negative for color change and wound.  Neurological: Negative for tremors and numbness other than noted  Psychiatric/Behavioral: Negative for decreased concentration or  hyperactivity.       Objective:   Physical Exam BP 130/80  Pulse 61  Temp(Src) 97.3 F (36.3 C) (Oral)  Ht 6\' 4"  (1.93 m)  Wt 297 lb 8 oz (134.945 kg)  BMI 36.23 kg/m2  SpO2 97% VS noted, mild ill Constitutional: Pt appears well-developed and well-nourished.  HENT: Head: NCAT.  Right Ear: External ear normal.  Left Ear: External ear normal.  Eyes: Conjunctivae and EOM are normal. Pupils are equal, round, and reactive to light.  Bilat tm's with mild erythema.  Max sinus areas mild tender.  Pharynx with mild erythema, no exudate Neck: Normal range of motion. Neck supple.  Cardiovascular: Normal rate and regular rhythm.   Pulmonary/Chest: Effort normal and breath sounds normal.  Neurological: Pt is alert. Not confused  Skin: Skin is warm. No erythema.  Psychiatric: Pt behavior is normal. Thought content normal. not depressed affect, mild nervous    Assessment & Plan:

## 2012-08-13 NOTE — Assessment & Plan Note (Signed)
stable overall by history and exam, recent data reviewed with pt, and pt to continue medical treatment as before,  to f/u any worsening symptoms or concerns BP Readings from Last 3 Encounters:  08/13/12 130/80  02/26/12 122/78  02/06/12 128/78

## 2012-09-01 ENCOUNTER — Ambulatory Visit (INDEPENDENT_AMBULATORY_CARE_PROVIDER_SITE_OTHER): Payer: 59 | Admitting: General Practice

## 2012-09-01 DIAGNOSIS — I4891 Unspecified atrial fibrillation: Secondary | ICD-10-CM

## 2012-09-01 DIAGNOSIS — Z7901 Long term (current) use of anticoagulants: Secondary | ICD-10-CM

## 2012-09-01 DIAGNOSIS — I2699 Other pulmonary embolism without acute cor pulmonale: Secondary | ICD-10-CM

## 2012-09-01 LAB — POCT INR: INR: 2.5

## 2012-09-06 ENCOUNTER — Other Ambulatory Visit: Payer: Self-pay | Admitting: Internal Medicine

## 2012-09-07 ENCOUNTER — Other Ambulatory Visit: Payer: Self-pay | Admitting: General Practice

## 2012-09-07 MED ORDER — WARFARIN SODIUM 4 MG PO TABS: 4.0000 mg | ORAL_TABLET | Freq: Every day | ORAL | Status: DC

## 2012-10-13 ENCOUNTER — Ambulatory Visit (INDEPENDENT_AMBULATORY_CARE_PROVIDER_SITE_OTHER): Payer: 59 | Admitting: Family Medicine

## 2012-10-13 DIAGNOSIS — I4891 Unspecified atrial fibrillation: Secondary | ICD-10-CM

## 2012-10-13 DIAGNOSIS — Z7901 Long term (current) use of anticoagulants: Secondary | ICD-10-CM

## 2012-10-13 DIAGNOSIS — I2699 Other pulmonary embolism without acute cor pulmonale: Secondary | ICD-10-CM

## 2012-10-13 LAB — POCT INR: INR: 2.7

## 2012-11-24 ENCOUNTER — Ambulatory Visit (INDEPENDENT_AMBULATORY_CARE_PROVIDER_SITE_OTHER): Payer: 59 | Admitting: General Practice

## 2012-11-24 DIAGNOSIS — I2699 Other pulmonary embolism without acute cor pulmonale: Secondary | ICD-10-CM

## 2012-11-24 DIAGNOSIS — Z7901 Long term (current) use of anticoagulants: Secondary | ICD-10-CM

## 2012-11-24 DIAGNOSIS — I4891 Unspecified atrial fibrillation: Secondary | ICD-10-CM

## 2012-12-10 ENCOUNTER — Other Ambulatory Visit: Payer: Self-pay | Admitting: Internal Medicine

## 2013-01-05 ENCOUNTER — Ambulatory Visit (INDEPENDENT_AMBULATORY_CARE_PROVIDER_SITE_OTHER): Payer: 59 | Admitting: Family Medicine

## 2013-01-05 DIAGNOSIS — I2699 Other pulmonary embolism without acute cor pulmonale: Secondary | ICD-10-CM

## 2013-01-05 DIAGNOSIS — Z7901 Long term (current) use of anticoagulants: Secondary | ICD-10-CM

## 2013-01-05 DIAGNOSIS — I4891 Unspecified atrial fibrillation: Secondary | ICD-10-CM

## 2013-01-18 ENCOUNTER — Other Ambulatory Visit: Payer: Self-pay | Admitting: Internal Medicine

## 2013-01-19 ENCOUNTER — Other Ambulatory Visit: Payer: Self-pay | Admitting: General Practice

## 2013-01-19 MED ORDER — WARFARIN SODIUM 4 MG PO TABS: ORAL_TABLET | ORAL | Status: DC

## 2013-02-16 ENCOUNTER — Ambulatory Visit (INDEPENDENT_AMBULATORY_CARE_PROVIDER_SITE_OTHER): Payer: 59 | Admitting: General Practice

## 2013-02-16 DIAGNOSIS — Z7901 Long term (current) use of anticoagulants: Secondary | ICD-10-CM

## 2013-02-16 DIAGNOSIS — I4891 Unspecified atrial fibrillation: Secondary | ICD-10-CM

## 2013-02-16 DIAGNOSIS — I2699 Other pulmonary embolism without acute cor pulmonale: Secondary | ICD-10-CM

## 2013-03-10 ENCOUNTER — Other Ambulatory Visit: Payer: Self-pay | Admitting: Internal Medicine

## 2013-03-25 ENCOUNTER — Other Ambulatory Visit: Payer: Self-pay

## 2013-03-30 ENCOUNTER — Ambulatory Visit (INDEPENDENT_AMBULATORY_CARE_PROVIDER_SITE_OTHER): Payer: 59 | Admitting: General Practice

## 2013-03-30 DIAGNOSIS — I4891 Unspecified atrial fibrillation: Secondary | ICD-10-CM

## 2013-03-30 DIAGNOSIS — I2699 Other pulmonary embolism without acute cor pulmonale: Secondary | ICD-10-CM

## 2013-03-30 DIAGNOSIS — Z7901 Long term (current) use of anticoagulants: Secondary | ICD-10-CM

## 2013-03-30 NOTE — Progress Notes (Signed)
Pre-visit discussion using our clinic review tool. No additional management support is needed unless otherwise documented below in the visit note.  

## 2013-04-19 ENCOUNTER — Other Ambulatory Visit: Payer: Self-pay | Admitting: Internal Medicine

## 2013-04-20 ENCOUNTER — Other Ambulatory Visit: Payer: Self-pay | Admitting: General Practice

## 2013-04-20 MED ORDER — WARFARIN SODIUM 4 MG PO TABS: ORAL_TABLET | ORAL | Status: DC

## 2013-05-11 ENCOUNTER — Ambulatory Visit (INDEPENDENT_AMBULATORY_CARE_PROVIDER_SITE_OTHER): Payer: 59 | Admitting: General Practice

## 2013-05-11 DIAGNOSIS — Z7901 Long term (current) use of anticoagulants: Secondary | ICD-10-CM

## 2013-05-11 LAB — POCT INR: INR: 2.4

## 2013-05-11 NOTE — Progress Notes (Signed)
Pre-visit discussion using our clinic review tool. No additional management support is needed unless otherwise documented below in the visit note.  

## 2013-05-17 ENCOUNTER — Encounter: Payer: Self-pay | Admitting: Internal Medicine

## 2013-05-17 NOTE — Telephone Encounter (Signed)
Nancy to see above 

## 2013-05-19 ENCOUNTER — Encounter: Payer: Self-pay | Admitting: Internal Medicine

## 2013-05-19 ENCOUNTER — Ambulatory Visit (INDEPENDENT_AMBULATORY_CARE_PROVIDER_SITE_OTHER)
Admission: RE | Admit: 2013-05-19 | Discharge: 2013-05-19 | Disposition: A | Discharge status: Home / Self Care | Payer: 59 | Source: Ambulatory Visit | Attending: Internal Medicine | Admitting: Internal Medicine

## 2013-05-19 ENCOUNTER — Ambulatory Visit (INDEPENDENT_AMBULATORY_CARE_PROVIDER_SITE_OTHER): Payer: 59 | Admitting: Internal Medicine

## 2013-05-19 ENCOUNTER — Ambulatory Visit (INDEPENDENT_AMBULATORY_CARE_PROVIDER_SITE_OTHER): Payer: 59

## 2013-05-19 VITALS — BP 140/84 | HR 72 | Temp 98.7°F | Resp 16 | Wt 294.0 lb

## 2013-05-19 DIAGNOSIS — R079 Chest pain, unspecified: Secondary | ICD-10-CM

## 2013-05-19 DIAGNOSIS — Z0000 Encounter for general adult medical examination without abnormal findings: Secondary | ICD-10-CM

## 2013-05-19 DIAGNOSIS — J209 Acute bronchitis, unspecified: Secondary | ICD-10-CM

## 2013-05-19 LAB — CBC WITH DIFFERENTIAL/PLATELET
Basophils Absolute: 0 10*3/uL (ref 0.0–0.1)
Eosinophils Absolute: 0.2 10*3/uL (ref 0.0–0.7)
HCT: 40 % (ref 39.0–52.0)
Hemoglobin: 13.7 g/dL (ref 13.0–17.0)
Lymphocytes Relative: 30.4 % (ref 12.0–46.0)
Lymphs Abs: 1.8 10*3/uL (ref 0.7–4.0)
MCHC: 34.3 g/dL (ref 30.0–36.0)
Neutro Abs: 3.4 10*3/uL (ref 1.4–7.7)
Platelets: 226 10*3/uL (ref 150.0–400.0)
RDW: 13.4 % (ref 11.5–14.6)

## 2013-05-19 LAB — LIPID PANEL
Total CHOL/HDL Ratio: 6
Triglycerides: 418 mg/dL — ABNORMAL HIGH (ref 0.0–149.0)
VLDL: 83.6 mg/dL — ABNORMAL HIGH (ref 0.0–40.0)

## 2013-05-19 LAB — URINALYSIS, ROUTINE W REFLEX MICROSCOPIC
Bilirubin Urine: NEGATIVE
Hgb urine dipstick: NEGATIVE
Leukocytes, UA: NEGATIVE
Nitrite: NEGATIVE
RBC / HPF: NONE SEEN (ref 0–?)
Specific Gravity, Urine: 1.01 (ref 1.000–1.030)
Urine Glucose: NEGATIVE
Urobilinogen, UA: 0.2 (ref 0.0–1.0)
WBC, UA: NONE SEEN (ref 0–?)

## 2013-05-19 LAB — HEPATIC FUNCTION PANEL
ALT: 26 U/L (ref 0–53)
AST: 19 U/L (ref 0–37)
Alkaline Phosphatase: 58 U/L (ref 39–117)
Bilirubin, Direct: 0.2 mg/dL (ref 0.0–0.3)
Total Protein: 6.8 g/dL (ref 6.0–8.3)

## 2013-05-19 LAB — BASIC METABOLIC PANEL
CO2: 29 mEq/L (ref 19–32)
Calcium: 9.2 mg/dL (ref 8.4–10.5)
Chloride: 104 mEq/L (ref 96–112)
Creatinine, Ser: 1 mg/dL (ref 0.4–1.5)
Potassium: 4.4 mEq/L (ref 3.5–5.1)
Sodium: 138 mEq/L (ref 135–145)

## 2013-05-19 MED ORDER — LEVOFLOXACIN 250 MG PO TABS: 250.0000 mg | ORAL_TABLET | Freq: Every day | ORAL | Status: DC

## 2013-05-19 MED ORDER — HYDROCODONE-HOMATROPINE 5-1.5 MG/5ML PO SYRP: 5.0000 mL | ORAL_SOLUTION | Freq: Four times a day (QID) | ORAL | Status: DC | PRN

## 2013-05-19 NOTE — Assessment & Plan Note (Signed)

## 2013-05-19 NOTE — Progress Notes (Signed)
Subjective:    Patient ID: Justin Woodard, male    DOB: 29-Jan-1956, 57 y.o.   MRN: [id number]  HPI  Here for wellness and f/u;  Overall doing ok;  Pt denies CP, worsening SOB, DOE, wheezing, orthopnea, PND, worsening LE edema, palpitations, dizziness or syncope.  Pt denies neurological change such as new headache, facial or extremity weakness.  Pt denies polydipsia, polyuria, or low sugar symptoms. Pt states overall good compliance with treatment and medications, good tolerability, and has been trying to follow lower cholesterol diet.  Pt denies worsening depressive symptoms, suicidal ideation or panic. No fever, night sweats, wt loss, loss of appetite, or other constitutional symptoms.  Pt states good ability with ADL's, has low fall risk, home safety reviewed and adequate, no other significant changes in hearing or vision, and only occasionally active with exercise.  Decliens colon screening and immunizations.  Also Here with 2-3 days acute onset fever, facial pain, pressure, headache, general weakness and malaise, and greenish d/c, with mild ST and cough, and occas dull but tender area Left lateral CP, worse with stress and when lies on left side at night, but pt denies other chest pain, wheezing, increased sob or doe, orthopnea, PND, increased LE swelling, palpitations, dizziness or syncope. Past Medical History  Diagnosis Date  . DVT (deep venous thrombosis)     2000  . Pulmonary embolism   . A-fib     post PE only  . GERD (gastroesophageal reflux disease)   . Hypertension   . Anxiety   . Symptomatic PVCs   . Erectile dysfunction 05/28/2011   Past Surgical History  Procedure Laterality Date  . Vein surgery      RLE saphenous vein stripping  . Tonsillectomy      reports that he has quit smoking. He does not have any smokeless tobacco history on file. He reports that he does not drink alcohol or use illicit drugs. family history includes Diabetes in an other family member; Heart attack in  his father. No Known Allergies Current Outpatient Prescriptions on File Prior to Visit  Medication Sig Dispense Refill  . metoprolol succinate (TOPROL-XL) 25 MG 24 hr tablet TAKE 1 TABLET BY MOUTH DAILY  90 tablet  2  . warfarin (COUMADIN) 4 MG tablet Take as directed by anticoagulation clinic.  60 tablet  2   No current facility-administered medications on file prior to visit.     Review of Systems Constitutional: Negative for diaphoresis, activity change, appetite change or unexpected weight change.  HENT: Negative for hearing loss, ear pain, facial swelling, mouth sores and neck stiffness.   Eyes: Negative for pain, redness and visual disturbance.  Respiratory: Negative for shortness of breath and wheezing.   Cardiovascular: Negative for chest pain and palpitations.  Gastrointestinal: Negative for diarrhea, blood in stool, abdominal distention or other pain Genitourinary: Negative for hematuria, flank pain or change in urine volume.  Musculoskeletal: Negative for myalgias and joint swelling.  Skin: Negative for color change and wound.  Neurological: Negative for syncope and numbness. other than noted Hematological: Negative for adenopathy.  Psychiatric/Behavioral: Negative for hallucinations, self-injury, decreased concentration and agitation.      Objective:   Physical Exam BP 140/84  Pulse 72  Temp(Src) 98.7 F (37.1 C) (Oral)  Resp 16  Wt 294 lb (133.358 kg) VS noted, mild ill Constitutional: Pt is oriented to person, place, and time. Appears well-developed and well-nourished.  Head: Normocephalic and atraumatic.  Right Ear: External ear normal.  Left  Ear: External ear normal.  Nose: Nose normal.  Mouth/Throat: Oropharynx is clear and moist.  Bilat tm's with mild erythema.  Max sinus areas non tender.  Pharynx with mild erythema, no exudate Eyes: Conjunctivae and EOM are normal. Pupils are equal, round, and reactive to light.  Neck: Normal range of motion. Neck  supple. No JVD present. No tracheal deviation present.  Cardiovascular: Normal rate, regular rhythm, normal heart sounds and intact distal pulses.   Pulmonary/Chest: Effort normal and breath sounds normal.  Abdominal: Soft. Bowel sounds are normal. There is no tenderness. No HSM  Musculoskeletal: Normal range of motion. Exhibits no edema.  Lymphadenopathy:  Has no cervical adenopathy.  Neurological: Pt is alert and oriented to person, place, and time. Pt has normal reflexes. No cranial nerve deficit.  Skin: Skin is warm and dry. No rash noted.  Tender lateral chest wall without rash, swelling, erythema Psychiatric:  Has  normal mood and affect. Behavior is normal.     Assessment & Plan:

## 2013-05-19 NOTE — Assessment & Plan Note (Signed)
Mild to mod, for antibx course,  to f/u any worsening symptoms or concerns 

## 2013-05-19 NOTE — Progress Notes (Signed)
Pre visit review using our clinic review tool, if applicable. No additional management support is needed unless otherwise documented below in the visit note. 

## 2013-05-19 NOTE — Patient Instructions (Addendum)
Please take all new medication as prescribed - the antibiotic, and the cough medicine as needed Please continue all other medications as before, and refills have been done if requested. Please have the pharmacy call with any other refills you may need. Please continue your efforts at being more active, low cholesterol diet, and weight control. You are otherwise up to date with prevention measures today, but please call if you change your mind about the colon testing or shots  Please keep your appointments with your specialists as you have planned - coumadin clinic and cardiology  Please go to the XRAY Department in the Basement (go straight as you get off the elevator) for the x-ray testing  Please go to the LAB in the Basement (turn left off the elevator) for the tests to be done today  You will be contacted by phone if any changes need to be made immediately.  Otherwise, you will receive a letter about your results with an explanation, but please check with MyChart first.  Please remember to sign up for My Chart if you have not done so, as this will be important to you in the future with finding out test results, communicating by private email, and scheduling acute appointments online when needed.  Please return in 1 year for your yearly visit, or sooner if needed

## 2013-05-19 NOTE — Assessment & Plan Note (Signed)
C/w msk, for cxr r/o pulm such as pna

## 2013-05-21 LAB — LDL CHOLESTEROL, DIRECT: Direct LDL: 86.9 mg/dL

## 2013-05-21 LAB — TSH: TSH: 2.54 u[IU]/mL (ref 0.35–5.50)

## 2013-06-22 ENCOUNTER — Encounter: Payer: Self-pay | Admitting: Internal Medicine

## 2013-06-22 ENCOUNTER — Ambulatory Visit (INDEPENDENT_AMBULATORY_CARE_PROVIDER_SITE_OTHER): Payer: 59 | Admitting: Internal Medicine

## 2013-06-22 ENCOUNTER — Ambulatory Visit (INDEPENDENT_AMBULATORY_CARE_PROVIDER_SITE_OTHER): Payer: 59 | Admitting: *Deleted

## 2013-06-22 VITALS — BP 120/88 | HR 71 | Ht 76.0 in | Wt 303.6 lb

## 2013-06-22 DIAGNOSIS — R079 Chest pain, unspecified: Secondary | ICD-10-CM

## 2013-06-22 DIAGNOSIS — I2699 Other pulmonary embolism without acute cor pulmonale: Secondary | ICD-10-CM

## 2013-06-22 DIAGNOSIS — Z7901 Long term (current) use of anticoagulants: Secondary | ICD-10-CM

## 2013-06-22 DIAGNOSIS — I4891 Unspecified atrial fibrillation: Secondary | ICD-10-CM

## 2013-06-22 DIAGNOSIS — Z5181 Encounter for therapeutic drug level monitoring: Secondary | ICD-10-CM | POA: Insufficient documentation

## 2013-06-22 LAB — POCT INR: INR: 2.4

## 2013-06-22 NOTE — Patient Instructions (Signed)
Your physician recommends that you schedule a follow-up appointment will call you with Echo results and determine follow up   Your physician has requested that you have an echocardiogram. Echocardiography is a painless test that uses sound waves to create images of your heart. It provides your doctor with information about the size and shape of your heart and how well your heart's chambers and valves are working. This procedure takes approximately one hour. There are no restrictions for this procedure.

## 2013-06-22 NOTE — Progress Notes (Signed)
HPI [name] returns today after a long absence from our cardiology clinic. He is a very pleasant 58 year old man with a history of pulmonary embolism, and paroxysmal atrial fibrillation after his embolism. He has done well in the interim. He denies syncope. He does have chronic intermittent peripheral edema. He is obese. He notes occasional discomfort in his calves. He also notes episodes of chest pain, non-exertional, and sob both at night and with activity. He has gained weight. No Known Allergies   Current Outpatient Prescriptions  Medication Sig Dispense Refill  . metoprolol succinate (TOPROL-XL) 25 MG 24 hr tablet TAKE 1 TABLET BY MOUTH DAILY  90 tablet  2  . warfarin (COUMADIN) 4 MG tablet Take as directed by anticoagulation clinic.  60 tablet  2   No current facility-administered medications for this visit.     Past Medical History  Diagnosis Date  . DVT (deep venous thrombosis)     2000  . Pulmonary embolism   . A-fib     post PE only  . GERD (gastroesophageal reflux disease)   . Hypertension   . Anxiety   . Symptomatic PVCs   . Erectile dysfunction 05/28/2011    ROS:   All systems reviewed and negative except as noted in the HPI.   Past Surgical History  Procedure Laterality Date  . Vein surgery      RLE saphenous vein stripping  . Tonsillectomy       Family History  Problem Relation Age of Onset  . Diabetes      first degree relatives  . Heart attack Father      History   Social History  . Marital Status: Married    Spouse Name: N/A    Number of Children: 2  . Years of Education: N/A   Occupational History  . 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 Smoking status: Former Games developermoker  . Smokeless tobacco: Not on file  . Alcohol Use: No  . Drug Use: No  . Sexual Activity:    Other Topics Concern  . 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  HEART:  Regular rate rhythm, no murmurs, no rubs, no clicks Abd:  soft, positive bowel sounds, no organomegally, no rebound, no guarding Ext:  2 plus pulses, 1+  edema, no cyanosis, no clubbing Skin:  No rashes no nodules Neuro:  CN II through XII intact, motor grossly intact  EKG  normal sinus rhythm   Assess/Plan:

## 2013-06-22 NOTE — Assessment & Plan Note (Signed)
The patient has dyspnea and a h/o a very large pulmonary embolism. I have asked him to undergo repeat echo. Will eval. RV function and pulmonary pressures.

## 2013-06-22 NOTE — Assessment & Plan Note (Signed)
His chest pain is non-exertional and non-cardiac. He will undergo watchful waiting.

## 2013-07-14 ENCOUNTER — Other Ambulatory Visit (HOSPITAL_COMMUNITY): Payer: 59

## 2013-07-25 ENCOUNTER — Other Ambulatory Visit: Payer: Self-pay | Admitting: Internal Medicine

## 2013-07-27 ENCOUNTER — Other Ambulatory Visit: Payer: Self-pay | Admitting: General Practice

## 2013-07-27 MED ORDER — WARFARIN SODIUM 4 MG PO TABS: ORAL_TABLET | ORAL | Status: DC

## 2013-08-02 ENCOUNTER — Ambulatory Visit (HOSPITAL_COMMUNITY): Payer: 59 | Attending: Internal Medicine | Admitting: Radiology

## 2013-08-02 ENCOUNTER — Encounter: Payer: Self-pay | Admitting: Internal Medicine

## 2013-08-02 ENCOUNTER — Ambulatory Visit (INDEPENDENT_AMBULATORY_CARE_PROVIDER_SITE_OTHER): Payer: 59 | Admitting: *Deleted

## 2013-08-02 DIAGNOSIS — I2699 Other pulmonary embolism without acute cor pulmonale: Secondary | ICD-10-CM

## 2013-08-02 DIAGNOSIS — I4891 Unspecified atrial fibrillation: Secondary | ICD-10-CM | POA: Insufficient documentation

## 2013-08-02 DIAGNOSIS — Z7901 Long term (current) use of anticoagulants: Secondary | ICD-10-CM

## 2013-08-02 DIAGNOSIS — R072 Precordial pain: Secondary | ICD-10-CM

## 2013-08-02 DIAGNOSIS — Z5181 Encounter for therapeutic drug level monitoring: Secondary | ICD-10-CM

## 2013-08-02 LAB — POCT INR: INR: 2.7

## 2013-08-02 NOTE — Progress Notes (Signed)
Echocardiogram Performed. 

## 2013-08-30 ENCOUNTER — Emergency Department (HOSPITAL_COMMUNITY)
Admission: EM | Admit: 2013-08-30 | Discharge: 2013-08-30 | Disposition: A | Discharge status: Home / Self Care | Payer: 59 | Attending: Emergency Medicine | Admitting: Emergency Medicine

## 2013-08-30 ENCOUNTER — Encounter (HOSPITAL_COMMUNITY): Payer: Self-pay | Admitting: Emergency Medicine

## 2013-08-30 ENCOUNTER — Emergency Department (HOSPITAL_COMMUNITY): Payer: 59

## 2013-08-30 DIAGNOSIS — Z87891 Personal history of nicotine dependence: Secondary | ICD-10-CM | POA: Insufficient documentation

## 2013-08-30 DIAGNOSIS — Z8659 Personal history of other mental and behavioral disorders: Secondary | ICD-10-CM | POA: Insufficient documentation

## 2013-08-30 DIAGNOSIS — I4891 Unspecified atrial fibrillation: Secondary | ICD-10-CM | POA: Insufficient documentation

## 2013-08-30 DIAGNOSIS — M7989 Other specified soft tissue disorders: Secondary | ICD-10-CM | POA: Insufficient documentation

## 2013-08-30 DIAGNOSIS — R0989 Other specified symptoms and signs involving the circulatory and respiratory systems: Secondary | ICD-10-CM | POA: Insufficient documentation

## 2013-08-30 DIAGNOSIS — R05 Cough: Secondary | ICD-10-CM | POA: Insufficient documentation

## 2013-08-30 DIAGNOSIS — Z87448 Personal history of other diseases of urinary system: Secondary | ICD-10-CM | POA: Insufficient documentation

## 2013-08-30 DIAGNOSIS — Z86711 Personal history of pulmonary embolism: Secondary | ICD-10-CM | POA: Insufficient documentation

## 2013-08-30 DIAGNOSIS — Z86718 Personal history of other venous thrombosis and embolism: Secondary | ICD-10-CM | POA: Insufficient documentation

## 2013-08-30 DIAGNOSIS — Z8719 Personal history of other diseases of the digestive system: Secondary | ICD-10-CM | POA: Insufficient documentation

## 2013-08-30 DIAGNOSIS — R0602 Shortness of breath: Secondary | ICD-10-CM

## 2013-08-30 DIAGNOSIS — I10 Essential (primary) hypertension: Secondary | ICD-10-CM | POA: Insufficient documentation

## 2013-08-30 DIAGNOSIS — R791 Abnormal coagulation profile: Secondary | ICD-10-CM

## 2013-08-30 DIAGNOSIS — R059 Cough, unspecified: Secondary | ICD-10-CM | POA: Insufficient documentation

## 2013-08-30 DIAGNOSIS — Z7901 Long term (current) use of anticoagulants: Secondary | ICD-10-CM | POA: Insufficient documentation

## 2013-08-30 LAB — BASIC METABOLIC PANEL
BUN: 12 mg/dL (ref 6–23)
CHLORIDE: 103 meq/L (ref 96–112)
CO2: 23 meq/L (ref 19–32)
Calcium: 9.2 mg/dL (ref 8.4–10.5)
Creatinine, Ser: 1.03 mg/dL (ref 0.50–1.35)
GFR calc Af Amer: 90 mL/min — ABNORMAL LOW (ref >90)
GFR calc non Af Amer: 78 mL/min — ABNORMAL LOW (ref >90)
GLUCOSE: 152 mg/dL — AB (ref 70–99)
POTASSIUM: 3.7 meq/L (ref 3.7–5.3)
Sodium: 141 mEq/L (ref 137–147)

## 2013-08-30 LAB — CBC
HCT: 39.4 % (ref 39.0–52.0)
HEMOGLOBIN: 13.9 g/dL (ref 13.0–17.0)
MCH: 30.4 pg (ref 26.0–34.0)
MCHC: 35.3 g/dL (ref 30.0–36.0)
MCV: 86.2 fL (ref 78.0–100.0)
PLATELETS: 195 10*3/uL (ref 150–400)
RBC: 4.57 MIL/uL (ref 4.22–5.81)
RDW: 12.9 % (ref 11.5–15.5)
WBC: 5.6 10*3/uL (ref 4.0–10.5)

## 2013-08-30 LAB — I-STAT TROPONIN, ED: Troponin i, poc: 0.01 ng/mL (ref 0.00–0.08)

## 2013-08-30 LAB — PROTIME-INR
INR: 1.76 — AB (ref 0.00–1.49)
Prothrombin Time: 20 seconds — ABNORMAL HIGH (ref 11.6–15.2)

## 2013-08-30 LAB — PRO B NATRIURETIC PEPTIDE: Pro B Natriuretic peptide (BNP): 150 pg/mL — ABNORMAL HIGH (ref 0–125)

## 2013-08-30 MED ORDER — IOHEXOL 350 MG/ML SOLN
80.0000 mL | Freq: Once | INTRAVENOUS | Status: AC | PRN
  Administered 2013-08-30: 80 mL via INTRAVENOUS

## 2013-08-30 NOTE — ED Notes (Signed)
Pt in c/o shortness of breath that started this afternoon, denies pain, also cough and congestion over the last week, states symptoms increased today, history of PE so patient was concerned about that, states he takes coumadin currently but thinks he missed a dose some time last week, no distress noted, speaking full sentences, denies recently long trips or flights.

## 2013-08-30 NOTE — ED Provider Notes (Signed)
CSN: [id number]     Arrival date & time 08/30/13  1616 History   First MD Initiated Contact with Patient 08/30/13 1934     Chief Complaint  Patient presents with  . Shortness of Breath     (Consider location/radiation/quality/duration/timing/severity/associated sxs/prior Treatment) HPI 58 year old male presents with shortness of breath this started about 6 hours ago. He states his chart one is at work. It is mildly improved since then but seems similar to the time her headache pulmonary embolism. This was over 15 years ago. He states he did notice he having some cough and chest congestion of the last couple weeks. Denies a fevers or chills. No pain, including no chest pain. His legs are always swollen but not worse than normal. He is on Coumadin chronically but states that about one week ago he missed a dose and tried to supplement this the next day. He states he felt a lightheaded on the way over here while driving but now feels well.  Past Medical History  Diagnosis Date  . DVT (deep venous thrombosis)     2000  . Pulmonary embolism   . A-fib     post PE only  . GERD (gastroesophageal reflux disease)   . Hypertension   . Anxiety   . Symptomatic PVCs   . Erectile dysfunction 05/28/2011   Past Surgical History  Procedure Laterality Date  . Vein surgery      RLE saphenous vein stripping  . Tonsillectomy     Family History  Problem Relation Age of Onset  . Diabetes      first degree relatives  . Heart attack Father    History  Substance Use Topics  . Smoking status: Former Games developermoker  . Smokeless tobacco: Not on file  . Alcohol Use: No    Review of Systems  Constitutional: Negative for fever and chills.  HENT: Positive for congestion.   Respiratory: Positive for cough (chronic) and shortness of breath.   Cardiovascular: Positive for leg swelling (chronic). Negative for chest pain.  Gastrointestinal: Negative for vomiting and abdominal pain.  Neurological: Negative for  syncope and weakness.  All other systems reviewed and are negative.     Allergies  Review of patient's allergies indicates no known allergies.  Home Medications   Current Outpatient Rx  Name  Route  Sig  Dispense  Refill  . metoprolol succinate (TOPROL-XL) 25 MG 24 hr tablet   Oral   Take 25 mg by mouth every evening.         . neomycin-bacitracin-polymyxin (NEOSPORIN) OINT   Topical   Apply 1 application topically as needed for irritation or wound care.         . warfarin (COUMADIN) 4 MG tablet   Oral   Take 6-8 mg by mouth daily at 6 PM. Takes 6mg  on Mon and Fri  Takes 8mg  all other days          BP 136/71  Pulse 67  Temp(Src) 98.8 F (37.1 C) (Oral)  Resp 13  SpO2 97% Physical Exam  Nursing note and vitals reviewed. Constitutional: He is oriented to person, place, and time. He appears well-developed and well-nourished. No distress.  HENT:  Head: Normocephalic and atraumatic.  Right Ear: External ear normal.  Left Ear: External ear normal.  Nose: Nose normal.  Eyes: Right eye exhibits no discharge. Left eye exhibits no discharge.  Neck: Neck supple.  Cardiovascular: Normal rate, regular rhythm, normal heart sounds and intact distal pulses.   Pulmonary/Chest:  Effort normal and breath sounds normal. No respiratory distress.  Abdominal: Soft. He exhibits no distension. There is no tenderness.  Musculoskeletal: He exhibits no edema.  Chronically swollen bilateral lower extremities with engorged veins  Neurological: He is alert and oriented to person, place, and time.  Skin: Skin is warm and dry.    ED Course  Procedures (including critical care time) Labs Review Labs Reviewed  BASIC METABOLIC PANEL - Abnormal; Notable for the following:    Glucose, Bld 152 (*)    GFR calc non Af Amer 78 (*)    GFR calc Af Amer 90 (*)    All other components within normal limits  PRO B NATRIURETIC PEPTIDE - Abnormal; Notable for the following:    Pro B Natriuretic  peptide (BNP) 150.0 (*)    All other components within normal limits  PROTIME-INR - Abnormal; Notable for the following:    Prothrombin Time 20.0 (*)    INR 1.76 (*)    All other components within normal limits  CBC  I-STAT TROPOININ, ED   Imaging Review Ct Angio Chest Pe W/cm &/or Wo Cm  08/30/2013   CLINICAL DATA:  Shortness of breath. Chest congestion. History of pulmonary emboli.  EXAM: CT ANGIOGRAPHY CHEST WITH CONTRAST  TECHNIQUE: Multidetector CT imaging of the chest was performed using the standard protocol during bolus administration of intravenous contrast. Multiplanar CT image reconstructions and MIPs were obtained to evaluate the vascular anatomy.  CONTRAST:  80mL OMNIPAQUE IOHEXOL 350 MG/ML SOLN  COMPARISON:  Radiography 05/19/2013.  CT 03/08/2006.  FINDINGS: The lungs are clear. No infiltrate, collapse or focal lesion. No pleural or pericardial fluid. There is a small hiatal hernia.  Pulmonary arterial opacification is moderate. There are no filling defects to suggest pulmonary emboli. There are a few pulmonary arterial branches that are chronically seen to under opacified compared to the remainder of the pulmonary arterial tree. This probably relates to chronic change related from the previous acute event.  Review of the MIP images confirms the above findings.  IMPRESSION: No pulmonary emboli. Mild chronic changes of a few pulmonary arterial branches related to the previous history of embolic disease.  No active lung disease.   Electronically Signed   By: Paulina FusiMark  Shogry M.D.   On: 08/30/2013 21:41     EKG Interpretation   Date/Time:  Monday August 30 2013 16:22:52 EDT Ventricular Rate:  82 PR Interval:  186 QRS Duration: 102 QT Interval:  374 QTC Calculation: 436 R Axis:   47 Text Interpretation:  Normal sinus rhythm ST abnormality, possible  digitalis effect Abnormal ECG      MDM   Final diagnoses:  Shortness of breath  Subtherapeutic international normalized ratio (INR)     Patient with subacute shortness of breath over the course of today. He seems to be somewhat of an anxiety component. He denies any chest pain or exertional shortness of breath. He has been having URI symptoms for last couple weeks. His CT scan shows no pulmonary emboli or signs of pneumonia. Given that he has no hypoxia, tachycardia, or signs concerning for ACS I feel he is stable for discharge. As likely respiratory in nature, likely viral URI. On reevaluation the patient states he has no shortness of breath all the symptoms seemed to resolve. Will discharge home with close followup. His INR is subtherapeutic, which led to a CT scan, he will take 8 mg of 6 mg tonight and call his PCP who manages his INR tomorrow to followup  for close monitoring. His EKG has some nonspecific abnormalities in the ST segments but is unchanged from his most recent.    Audree Camel, MD 08/30/13 [phone]

## 2013-08-30 NOTE — Discharge Instructions (Signed)

## 2013-08-31 ENCOUNTER — Encounter: Payer: Self-pay | Admitting: Internal Medicine

## 2013-09-14 ENCOUNTER — Ambulatory Visit (INDEPENDENT_AMBULATORY_CARE_PROVIDER_SITE_OTHER): Payer: 59 | Admitting: General Practice

## 2013-09-14 DIAGNOSIS — Z7901 Long term (current) use of anticoagulants: Secondary | ICD-10-CM

## 2013-09-14 LAB — POCT INR: INR: 2.7

## 2013-09-14 NOTE — Progress Notes (Signed)
Pre visit review using our clinic review tool, if applicable. No additional management support is needed unless otherwise documented below in the visit note. 

## 2013-10-26 ENCOUNTER — Ambulatory Visit (INDEPENDENT_AMBULATORY_CARE_PROVIDER_SITE_OTHER): Payer: 59 | Admitting: General Practice

## 2013-10-26 DIAGNOSIS — Z7901 Long term (current) use of anticoagulants: Secondary | ICD-10-CM

## 2013-10-26 DIAGNOSIS — Z5181 Encounter for therapeutic drug level monitoring: Secondary | ICD-10-CM

## 2013-10-26 DIAGNOSIS — I2699 Other pulmonary embolism without acute cor pulmonale: Secondary | ICD-10-CM

## 2013-10-26 DIAGNOSIS — I4891 Unspecified atrial fibrillation: Secondary | ICD-10-CM

## 2013-10-26 LAB — POCT INR: INR: 2.2

## 2013-10-26 NOTE — Progress Notes (Signed)
Pre visit review using our clinic review tool, if applicable. No additional management support is needed unless otherwise documented below in the visit note. 

## 2013-12-07 ENCOUNTER — Ambulatory Visit (INDEPENDENT_AMBULATORY_CARE_PROVIDER_SITE_OTHER): Payer: 59 | Admitting: General Practice

## 2013-12-07 DIAGNOSIS — I2699 Other pulmonary embolism without acute cor pulmonale: Secondary | ICD-10-CM

## 2013-12-07 DIAGNOSIS — Z7901 Long term (current) use of anticoagulants: Secondary | ICD-10-CM

## 2013-12-07 DIAGNOSIS — Z5181 Encounter for therapeutic drug level monitoring: Secondary | ICD-10-CM

## 2013-12-07 LAB — POCT INR: INR: 3.3

## 2013-12-07 NOTE — Progress Notes (Signed)
Pre visit review using our clinic review tool, if applicable. No additional management support is needed unless otherwise documented below in the visit note. 

## 2013-12-12 ENCOUNTER — Other Ambulatory Visit: Payer: Self-pay | Admitting: Internal Medicine

## 2013-12-27 ENCOUNTER — Other Ambulatory Visit: Payer: Self-pay | Admitting: Family Medicine

## 2013-12-27 ENCOUNTER — Other Ambulatory Visit: Payer: Self-pay | Admitting: Internal Medicine

## 2014-01-05 ENCOUNTER — Ambulatory Visit (INDEPENDENT_AMBULATORY_CARE_PROVIDER_SITE_OTHER): Payer: 59 | Admitting: *Deleted

## 2014-01-05 DIAGNOSIS — I2699 Other pulmonary embolism without acute cor pulmonale: Secondary | ICD-10-CM

## 2014-01-05 DIAGNOSIS — Z5181 Encounter for therapeutic drug level monitoring: Secondary | ICD-10-CM

## 2014-01-05 DIAGNOSIS — Z7901 Long term (current) use of anticoagulants: Secondary | ICD-10-CM

## 2014-01-05 LAB — POCT INR: INR: 2.8

## 2014-02-16 ENCOUNTER — Ambulatory Visit (INDEPENDENT_AMBULATORY_CARE_PROVIDER_SITE_OTHER): Payer: 59 | Admitting: *Deleted

## 2014-02-16 DIAGNOSIS — Z5181 Encounter for therapeutic drug level monitoring: Secondary | ICD-10-CM

## 2014-02-16 DIAGNOSIS — Z7901 Long term (current) use of anticoagulants: Secondary | ICD-10-CM

## 2014-02-16 DIAGNOSIS — I2699 Other pulmonary embolism without acute cor pulmonale: Secondary | ICD-10-CM

## 2014-02-16 LAB — POCT INR: INR: 2.7

## 2014-03-24 ENCOUNTER — Encounter: Payer: Self-pay | Admitting: Internal Medicine

## 2014-03-30 ENCOUNTER — Ambulatory Visit (INDEPENDENT_AMBULATORY_CARE_PROVIDER_SITE_OTHER): Payer: 59

## 2014-03-30 DIAGNOSIS — Z5181 Encounter for therapeutic drug level monitoring: Secondary | ICD-10-CM

## 2014-03-30 DIAGNOSIS — Z7901 Long term (current) use of anticoagulants: Secondary | ICD-10-CM

## 2014-03-30 LAB — POCT INR: INR: 2.7

## 2014-04-04 ENCOUNTER — Other Ambulatory Visit: Payer: Self-pay | Admitting: Internal Medicine

## 2014-04-04 DIAGNOSIS — I4891 Unspecified atrial fibrillation: Secondary | ICD-10-CM

## 2014-05-11 ENCOUNTER — Ambulatory Visit (INDEPENDENT_AMBULATORY_CARE_PROVIDER_SITE_OTHER): Payer: 59 | Admitting: Family Medicine

## 2014-05-11 DIAGNOSIS — Z5181 Encounter for therapeutic drug level monitoring: Secondary | ICD-10-CM

## 2014-05-11 DIAGNOSIS — Z7901 Long term (current) use of anticoagulants: Secondary | ICD-10-CM

## 2014-05-11 LAB — POCT INR: INR: 2

## 2014-05-17 ENCOUNTER — Emergency Department (HOSPITAL_COMMUNITY)
Admission: EM | Admit: 2014-05-17 | Discharge: 2014-05-17 | Disposition: A | Discharge status: Home / Self Care | Payer: 59 | Attending: Emergency Medicine | Admitting: Emergency Medicine

## 2014-05-17 ENCOUNTER — Emergency Department (HOSPITAL_COMMUNITY): Payer: 59

## 2014-05-17 ENCOUNTER — Encounter (HOSPITAL_COMMUNITY): Payer: Self-pay | Admitting: Emergency Medicine

## 2014-05-17 DIAGNOSIS — R0789 Other chest pain: Secondary | ICD-10-CM | POA: Diagnosis not present

## 2014-05-17 DIAGNOSIS — R0602 Shortness of breath: Secondary | ICD-10-CM | POA: Insufficient documentation

## 2014-05-17 DIAGNOSIS — Z8659 Personal history of other mental and behavioral disorders: Secondary | ICD-10-CM | POA: Insufficient documentation

## 2014-05-17 DIAGNOSIS — Z79899 Other long term (current) drug therapy: Secondary | ICD-10-CM | POA: Insufficient documentation

## 2014-05-17 DIAGNOSIS — Z8719 Personal history of other diseases of the digestive system: Secondary | ICD-10-CM | POA: Diagnosis not present

## 2014-05-17 DIAGNOSIS — R05 Cough: Secondary | ICD-10-CM | POA: Insufficient documentation

## 2014-05-17 DIAGNOSIS — I10 Essential (primary) hypertension: Secondary | ICD-10-CM | POA: Diagnosis not present

## 2014-05-17 DIAGNOSIS — Z7901 Long term (current) use of anticoagulants: Secondary | ICD-10-CM | POA: Insufficient documentation

## 2014-05-17 DIAGNOSIS — Z87438 Personal history of other diseases of male genital organs: Secondary | ICD-10-CM | POA: Insufficient documentation

## 2014-05-17 DIAGNOSIS — Z87891 Personal history of nicotine dependence: Secondary | ICD-10-CM | POA: Diagnosis not present

## 2014-05-17 DIAGNOSIS — R079 Chest pain, unspecified: Secondary | ICD-10-CM | POA: Diagnosis present

## 2014-05-17 DIAGNOSIS — Z86711 Personal history of pulmonary embolism: Secondary | ICD-10-CM | POA: Insufficient documentation

## 2014-05-17 DIAGNOSIS — Z86718 Personal history of other venous thrombosis and embolism: Secondary | ICD-10-CM | POA: Insufficient documentation

## 2014-05-17 LAB — I-STAT TROPONIN, ED
Troponin i, poc: 0 ng/mL (ref 0.00–0.08)
Troponin i, poc: 0 ng/mL (ref 0.00–0.08)

## 2014-05-17 LAB — BASIC METABOLIC PANEL
Anion gap: 8 (ref 5–15)
BUN: 11 mg/dL (ref 6–23)
CALCIUM: 9.1 mg/dL (ref 8.4–10.5)
CO2: 24 mmol/L (ref 19–32)
Chloride: 106 mEq/L (ref 96–112)
Creatinine, Ser: 1.08 mg/dL (ref 0.50–1.35)
GFR calc Af Amer: 84 mL/min — ABNORMAL LOW (ref >90)
GFR, EST NON AFRICAN AMERICAN: 73 mL/min — AB (ref >90)
GLUCOSE: 108 mg/dL — AB (ref 70–99)
Potassium: 3.8 mmol/L (ref 3.5–5.1)
SODIUM: 138 mmol/L (ref 135–145)

## 2014-05-17 LAB — CBC
HCT: 37.9 % — ABNORMAL LOW (ref 39.0–52.0)
HEMOGLOBIN: 13 g/dL (ref 13.0–17.0)
MCH: 29.7 pg (ref 26.0–34.0)
MCHC: 34.3 g/dL (ref 30.0–36.0)
MCV: 86.5 fL (ref 78.0–100.0)
PLATELETS: 182 10*3/uL (ref 150–400)
RBC: 4.38 MIL/uL (ref 4.22–5.81)
RDW: 13.1 % (ref 11.5–15.5)
WBC: 5 10*3/uL (ref 4.0–10.5)

## 2014-05-17 LAB — BRAIN NATRIURETIC PEPTIDE: B NATRIURETIC PEPTIDE 5: 32 pg/mL (ref 0.0–100.0)

## 2014-05-17 MED ORDER — GI COCKTAIL ~~LOC~~
30.0000 mL | Freq: Once | ORAL | Status: AC
  Administered 2014-05-17: 30 mL via ORAL
  Filled 2014-05-17: qty 30

## 2014-05-17 NOTE — Discharge Instructions (Signed)

## 2014-05-17 NOTE — ED Notes (Signed)
Pt. woke up this morning with intermittent mid chest pain , SOB and dry cough , denies nausea or diaphoresis .

## 2014-05-17 NOTE — ED Provider Notes (Addendum)
CSN: [id number]     Arrival date & time 05/17/14  [id number] History   None   This chart was scribed for No att. providers found by Marica OtterNusrat Rahman, ED Scribe. This patient was seen in room B14C/B14C and the patient's care was started at 3:54 AM.  Chief Complaint  Patient presents with  . Chest Pain   The history is provided by the patient. No language interpreter was used.   PCP: Oliver BarreJames John, MD HPI Comments: Justin Woodard is a 58 y.o. male, with medical Hx noted below including HTN and A-fib, who presents to the Emergency Department complaining of sudden onset, atraumatic, aching mid chest pain with occassional sharp tinges of pain with associated SOB, cough onset early this morning upon awakening. He last ate around 10 and pain woke him up at 2.  No reproducbible with deep breaths or coughing.  Pt rates his present chest pain an 8 out of 10.  Pt has had pain like this in the past and all prior eval of heart has been neg.  Pt has had multiple neg stress test but denies ever having cath.  Pt does have hx of PE/DVT but currently on coumadin and INR has remained stable with last check 4 days ago and it was 2.05.    Pt denies nausea, diaphoresis. Dad with MI in his 5750's.  Past Medical History  Diagnosis Date  . DVT (deep venous thrombosis)     2000  . Pulmonary embolism   . A-fib     post PE only  . GERD (gastroesophageal reflux disease)   . Hypertension   . Anxiety   . Symptomatic PVCs   . Erectile dysfunction 05/28/2011   Past Surgical History  Procedure Laterality Date  . Vein surgery      RLE saphenous vein stripping  . Tonsillectomy     Family History  Problem Relation Age of Onset  . Diabetes      first degree relatives  . Heart attack Father    History  Substance Use Topics  . Smoking status: Former Games developermoker  . Smokeless tobacco: Not on file  . Alcohol Use: No    Review of Systems  A complete 10 system review of systems was obtained and all systems are negative except as  noted in the HPI and PMH.    Allergies  Review of patient's allergies indicates no known allergies.  Home Medications   Prior to Admission medications   Medication Sig Start Date End Date Taking? Authorizing Provider  metoprolol succinate (TOPROL-XL) 25 MG 24 hr tablet Take 25 mg by mouth every evening.    Historical Provider, MD  metoprolol succinate (TOPROL-XL) 25 MG 24 hr tablet TAKE 1 TABLET BY MOUTH DAILY    Corwin LevinsJames W John, MD  neomycin-bacitracin-polymyxin (NEOSPORIN) OINT Apply 1 application topically as needed for irritation or wound care.    Historical Provider, MD  warfarin (COUMADIN) 4 MG tablet TAKE AS DIRECTED BY ANTICOAGULATION CLINIC 04/05/14   Corwin LevinsJames W John, MD   Triage Vitals: BP 151/92 mmHg  Pulse 77  Temp(Src) 98.2 F (36.8 C) (Oral)  Resp 14  Ht 6\' 3"  (1.905 m)  Wt 306 lb (138.801 kg)  BMI 38.25 kg/m2  SpO2 97% Physical Exam  Constitutional: He is oriented to person, place, and time. He appears well-developed and well-nourished. No distress.  HENT:  Head: Normocephalic and atraumatic.  Eyes: Conjunctivae and EOM are normal.  Neck: Neck supple. No tracheal deviation present.  Cardiovascular: Normal  rate, regular rhythm, normal heart sounds and intact distal pulses.  Exam reveals no friction rub.   No murmur heard. Pulmonary/Chest: Effort normal. No respiratory distress. He exhibits tenderness.    Abdominal: Soft. Bowel sounds are normal. He exhibits no distension. There is no tenderness. There is no rebound and no guarding.  Musculoskeletal: Normal range of motion.  Multiple varicose veins in bilateral lower extremities worse on the right  Neurological: He is alert and oriented to person, place, and time.  Skin: Skin is warm and dry.  Psychiatric: He has a normal mood and affect. His behavior is normal.  Nursing note and vitals reviewed.   ED Course  Procedures (including critical care time) DIAGNOSTIC STUDIES: Oxygen Saturation is 97% on RA, nl by my  interpretation.    COORDINATION OF CARE:  Labs Review Labs Reviewed  CBC - Abnormal; Notable for the following:    HCT 37.9 (*)    All other components within normal limits  BASIC METABOLIC PANEL - Abnormal; Notable for the following:    Glucose, Bld 108 (*)    GFR calc non Af Amer 73 (*)    GFR calc Af Amer 84 (*)    All other components within normal limits  BRAIN NATRIURETIC PEPTIDE  I-STAT TROPOININ, ED  I-STAT TROPOININ, ED    Imaging Review Dg Chest 2 View  05/17/2014   CLINICAL DATA:  Chest pain and shortness of breath  EXAM: CHEST  2 VIEW  COMPARISON:  05/19/2013  FINDINGS: Normal heart size and mediastinal contours. No acute infiltrate or edema. No effusion or pneumothorax. No acute osseous findings.  IMPRESSION: No active cardiopulmonary disease.   Electronically Signed   By: Tiburcio Pea M.D.   On: 05/17/2014 05:10     EKG Interpretation   Date/Time:  Tuesday May 17 2014 03:04:12 EST Ventricular Rate:  72 PR Interval:  194 QRS Duration: 100 QT Interval:  402 QTC Calculation: 440 R Axis:   48 Text Interpretation:  Normal sinus rhythm Normal ECG No significant change  since last tracing Confirmed by Anitra Lauth  MD, Alphonzo Lemmings ([id number]) on  05/17/2014 4:15:51 AM      MDM   Final diagnoses:  Atypical chest pain    Patient here with atypical chest pain that woke him at 2 AM this morning and made him feel slightly short of breath. He has intermittent sharp pains in addition and dry cough. He denies nausea or diaphoresis. Patient does have a significant history of PE and DVT proximally 15 years ago and has been on Coumadin since. His Coumadin level remains stable at his last check was 4 days ago was 2.05. Patient has not been off of his Coumadin at any time recently and his INR is always about the same.  Patient has no prior cardiac history that his father did have an MI in his 17s. Patient had an echo done earlier this year and was normal and states that he's had  multiple stress test in the past that have all been normal. His last test was proximally 2 years ago.  Patient's EKG, chest x-ray, CBC, BMP, troponin are all within normal limits. Feel the patient needs a delta troponin. Also patient has a history of severe GERD and will try a GI cocktail.  6:27 AM Delta troponin wnl.  Pt had CTA of chest earlier this year which was normal and low suspicion for new clot today.  Pt states pressure was relieved in chest with GI cocktail and that he  has been having a lot of indigestion.  Pt will try H2 blocker and f/u with Dr. Oliver BarreJames John.  I personally performed the services described in this documentation, which was scribed in my presence.  The recorded information has been reviewed and considered.    Gwyneth SproutWhitney Mozella Rexrode, MD 05/17/14 [id number]  Gwyneth SproutWhitney Maigen Mozingo, MD 05/17/14 [id number]  Gwyneth SproutWhitney Fritz Cauthon, MD 05/17/14 [phone]

## 2014-06-08 ENCOUNTER — Other Ambulatory Visit: Payer: Self-pay | Admitting: Internal Medicine

## 2014-06-22 ENCOUNTER — Ambulatory Visit (INDEPENDENT_AMBULATORY_CARE_PROVIDER_SITE_OTHER): Payer: 59 | Admitting: General Practice

## 2014-06-22 ENCOUNTER — Telehealth: Payer: Self-pay | Admitting: Family

## 2014-06-22 DIAGNOSIS — Z5181 Encounter for therapeutic drug level monitoring: Secondary | ICD-10-CM

## 2014-06-22 DIAGNOSIS — Z7901 Long term (current) use of anticoagulants: Secondary | ICD-10-CM

## 2014-06-22 LAB — POCT INR: INR: 2.3

## 2014-06-22 NOTE — Progress Notes (Signed)
Pre visit review using our clinic review tool, if applicable. No additional management support is needed unless otherwise documented below in the visit note. 

## 2014-06-22 NOTE — Telephone Encounter (Signed)
Agree with plan 

## 2014-07-08 ENCOUNTER — Other Ambulatory Visit: Payer: Self-pay | Admitting: Internal Medicine

## 2014-07-11 ENCOUNTER — Other Ambulatory Visit: Payer: Self-pay | Admitting: Internal Medicine

## 2014-07-15 ENCOUNTER — Telehealth: Payer: Self-pay | Admitting: Internal Medicine

## 2014-07-15 MED ORDER — WARFARIN SODIUM 4 MG PO TABS: 6.0000 mg | ORAL_TABLET | Freq: Every day | ORAL | Status: DC

## 2014-07-15 NOTE — Telephone Encounter (Signed)
Pt had coumadin appt on 2/6 per notes need to f/u in 6 weeks. Sent 1 month refill...Raechel Chute/lmb

## 2014-07-15 NOTE — Telephone Encounter (Signed)
Pt called in requesting refill on his warfarin (COUMADIN) 4 MG tablet [[id number]][[id number]]    [facility] at [address].

## 2014-07-18 ENCOUNTER — Other Ambulatory Visit: Payer: Self-pay | Admitting: *Deleted

## 2014-07-18 ENCOUNTER — Encounter: Payer: Self-pay | Admitting: Internal Medicine

## 2014-07-18 MED ORDER — WARFARIN SODIUM 4 MG PO TABS: 6.0000 mg | ORAL_TABLET | Freq: Every day | ORAL | Status: DC

## 2014-07-18 NOTE — Telephone Encounter (Signed)
Left msg on triage stating a refill was sent on Friday for his coumadin, but he can not take the generic Justin Woodard. Been taking Justin Woodard name. Requesting Justin Woodard name script sent to pharmacy. Pt also sent email notified pt resent for Justin Woodard name, and need physical appt ASAP...Justin Woodard/lmb

## 2014-07-26 ENCOUNTER — Encounter: Payer: Self-pay | Admitting: Internal Medicine

## 2014-07-27 LAB — POCT INR: INR: 2.4

## 2014-08-02 ENCOUNTER — Ambulatory Visit: Payer: 59

## 2014-08-03 ENCOUNTER — Other Ambulatory Visit (INDEPENDENT_AMBULATORY_CARE_PROVIDER_SITE_OTHER): Payer: 59

## 2014-08-03 ENCOUNTER — Ambulatory Visit (INDEPENDENT_AMBULATORY_CARE_PROVIDER_SITE_OTHER): Payer: 59 | Admitting: Internal Medicine

## 2014-08-03 ENCOUNTER — Telehealth: Payer: Self-pay

## 2014-08-03 ENCOUNTER — Other Ambulatory Visit: Payer: Self-pay | Admitting: General Practice

## 2014-08-03 ENCOUNTER — Ambulatory Visit (INDEPENDENT_AMBULATORY_CARE_PROVIDER_SITE_OTHER): Payer: 59 | Admitting: General Practice

## 2014-08-03 VITALS — BP 136/88 | HR 63 | Temp 98.2°F | Resp 20 | Ht 75.0 in | Wt 300.1 lb

## 2014-08-03 DIAGNOSIS — Z0000 Encounter for general adult medical examination without abnormal findings: Secondary | ICD-10-CM

## 2014-08-03 DIAGNOSIS — R7989 Other specified abnormal findings of blood chemistry: Secondary | ICD-10-CM

## 2014-08-03 DIAGNOSIS — Z5181 Encounter for therapeutic drug level monitoring: Secondary | ICD-10-CM

## 2014-08-03 LAB — CBC WITH DIFFERENTIAL/PLATELET
Basophils Absolute: 0 10*3/uL (ref 0.0–0.1)
Basophils Relative: 0.8 % (ref 0.0–3.0)
EOS ABS: 0.2 10*3/uL (ref 0.0–0.7)
Eosinophils Relative: 3.2 % (ref 0.0–5.0)
HCT: 39.6 % (ref 39.0–52.0)
Hemoglobin: 13.7 g/dL (ref 13.0–17.0)
Lymphocytes Relative: 33.6 % (ref 12.0–46.0)
Lymphs Abs: 2 10*3/uL (ref 0.7–4.0)
MCHC: 34.6 g/dL (ref 30.0–36.0)
MCV: 85.8 fl (ref 78.0–100.0)
Monocytes Absolute: 0.4 10*3/uL (ref 0.1–1.0)
Monocytes Relative: 7.5 % (ref 3.0–12.0)
NEUTROS PCT: 54.9 % (ref 43.0–77.0)
Neutro Abs: 3.3 10*3/uL (ref 1.4–7.7)
Platelets: 224 10*3/uL (ref 150.0–400.0)
RBC: 4.61 Mil/uL (ref 4.22–5.81)
RDW: 13.3 % (ref 11.5–15.5)
WBC: 5.9 10*3/uL (ref 4.0–10.5)

## 2014-08-03 LAB — LIPID PANEL
Cholesterol: 174 mg/dL (ref 0–200)
HDL: 35.2 mg/dL — ABNORMAL LOW (ref >39.00)
NonHDL: 138.8
TRIGLYCERIDES: 324 mg/dL — AB (ref 0.0–149.0)
Total CHOL/HDL Ratio: 5
VLDL: 64.8 mg/dL — ABNORMAL HIGH (ref 0.0–40.0)

## 2014-08-03 LAB — BASIC METABOLIC PANEL
BUN: 12 mg/dL (ref 6–23)
CO2: 31 mEq/L (ref 19–32)
Calcium: 8.9 mg/dL (ref 8.4–10.5)
Chloride: 103 mEq/L (ref 96–112)
Creatinine, Ser: 1.01 mg/dL (ref 0.40–1.50)
GFR: 79.86 mL/min (ref >60.00)
GLUCOSE: 96 mg/dL (ref 70–99)
Potassium: 4.1 mEq/L (ref 3.5–5.1)
Sodium: 138 mEq/L (ref 135–145)

## 2014-08-03 LAB — HEPATIC FUNCTION PANEL
ALT: 25 U/L (ref 0–53)
AST: 19 U/L (ref 0–37)
Albumin: 4 g/dL (ref 3.5–5.2)
Alkaline Phosphatase: 63 U/L (ref 39–117)
BILIRUBIN DIRECT: 0.2 mg/dL (ref 0.0–0.3)
BILIRUBIN TOTAL: 1.2 mg/dL (ref 0.2–1.2)
Total Protein: 6.5 g/dL (ref 6.0–8.3)

## 2014-08-03 LAB — URINALYSIS, ROUTINE W REFLEX MICROSCOPIC
BILIRUBIN URINE: NEGATIVE
HGB URINE DIPSTICK: NEGATIVE
Ketones, ur: NEGATIVE
Leukocytes, UA: NEGATIVE
Nitrite: NEGATIVE
RBC / HPF: NONE SEEN (ref 0–?)
Specific Gravity, Urine: 1.025 (ref 1.000–1.030)
Total Protein, Urine: NEGATIVE
URINE GLUCOSE: NEGATIVE
Urobilinogen, UA: 0.2 (ref 0.0–1.0)
WBC, UA: NONE SEEN (ref 0–?)
pH: 6 (ref 5.0–8.0)

## 2014-08-03 LAB — LDL CHOLESTEROL, DIRECT: LDL DIRECT: 71 mg/dL

## 2014-08-03 LAB — TSH: TSH: 2.12 u[IU]/mL (ref 0.35–4.50)

## 2014-08-03 LAB — PSA: PSA: 1.84 ng/mL (ref 0.10–4.00)

## 2014-08-03 MED ORDER — WARFARIN SODIUM 4 MG PO TABS: ORAL_TABLET | ORAL | Status: DC

## 2014-08-03 MED ORDER — VARDENAFIL HCL 20 MG PO TABS: 20.0000 mg | ORAL_TABLET | ORAL | Status: DC | PRN

## 2014-08-03 NOTE — Patient Instructions (Signed)

## 2014-08-03 NOTE — Progress Notes (Signed)
Pre visit review using our clinic review tool, if applicable. No additional management support is needed unless otherwise documented below in the visit note. 

## 2014-08-03 NOTE — Progress Notes (Signed)
Agree with plan 

## 2014-08-03 NOTE — Telephone Encounter (Signed)
Pt. Stated when he left that he used to take Levitra years ago. He wanted to know if he could have another prescription sent to his pharmacy.

## 2014-08-03 NOTE — Telephone Encounter (Signed)
Done erx 

## 2014-08-03 NOTE — Progress Notes (Signed)
Subjective:    Patient ID: Justin Woodard, male    DOB: 1956-03-08, 59 y.o.   MRN: [id number]  HPI  Here for wellness and f/u;  Overall doing ok;  Pt denies Chest pain, worsening SOB, DOE, wheezing, orthopnea, PND, worsening LE edema, palpitations, dizziness or syncope.  Pt denies neurological change such as new headache, facial or extremity weakness.  Pt denies polydipsia, polyuria, or low sugar symptoms. Pt states overall good compliance with treatment and medications, good tolerability, and has been trying to follow appropriate diet.  Pt denies worsening depressive symptoms, suicidal ideation or panic. No fever, night sweats, wt loss, loss of appetite, or other constitutional symptoms.  Pt states good ability with ADL's, has low fall risk, home safety reviewed and adequate, no other significant changes in hearing or vision, and only occasionally active with exercise.  No complaints Past Medical History  Diagnosis Date  . DVT (deep venous thrombosis)     2000  . Pulmonary embolism   . A-fib     post PE only  . GERD (gastroesophageal reflux disease)   . Hypertension   . Anxiety   . Symptomatic PVCs   . Erectile dysfunction 05/28/2011   Past Surgical History  Procedure Laterality Date  . Vein surgery      RLE saphenous vein stripping  . Tonsillectomy      reports that he has quit smoking. He does not have any smokeless tobacco history on file. He reports that he does not drink alcohol or use illicit drugs. family history includes Diabetes in an other family member; Heart attack in his father. No Known Allergies Current Outpatient Prescriptions on File Prior to Visit  Medication Sig Dispense Refill  . metoprolol succinate (TOPROL-XL) 25 MG 24 hr tablet Take 25 mg by mouth every evening.    . metoprolol succinate (TOPROL-XL) 25 MG 24 hr tablet TAKE 1 TABLET BY MOUTH DAILY 90 tablet 0  . neomycin-bacitracin-polymyxin (NEOSPORIN) OINT Apply 1 application topically as needed for irritation  or wound care.     No current facility-administered medications on file prior to visit.   Review of Systems Constitutional: Negative for increased diaphoresis, other activity, appetite or siginficant weight change other than noted HENT: Negative for worsening hearing loss, ear pain, facial swelling, mouth sores and neck stiffness.   Eyes: Negative for other worsening pain, redness or visual disturbance.  Respiratory: Negative for shortness of breath and wheezing  Cardiovascular: Negative for chest pain and palpitations.  Gastrointestinal: Negative for diarrhea, blood in stool, abdominal distention or other pain Genitourinary: Negative for hematuria, flank pain or change in urine volume.  Musculoskeletal: Negative for myalgias or other joint complaints.  Skin: Negative for color change and wound or drainage.  Neurological: Negative for syncope and numbness. other than noted Hematological: Negative for adenopathy. or other swelling Psychiatric/Behavioral: Negative for hallucinations, SI, self-injury, decreased concentration or other worsening agitation.      Objective:   Physical Exam BP 136/88 mmHg  Pulse 63  Temp(Src) 98.2 F (36.8 C) (Oral)  Resp 20  Ht  (1.905 m)  Wt 300 lb 1.9 oz (136.134 kg)  BMI 37.51 kg/m2  SpO2 97% VS noted,  Constitutional: Pt is oriented to person, place, and time. Appears well-developed and well-nourished, in no significant distress/morbid obese Head: Normocephalic and atraumatic.  Right Ear: External ear normal.  Left Ear: External ear normal.  Nose: Nose normal.  Mouth/Throat: Oropharynx is clear and moist.  Eyes: Conjunctivae and EOM are normal.  Pupils are equal, round, and reactive to light.  Neck: Normal range of motion. Neck supple. No JVD present. No tracheal deviation present or significant neck LA or mass Cardiovascular: Normal rate, regular rhythm, normal heart sounds and intact distal pulses.   Pulmonary/Chest: Effort normal and  breath sounds without rales or wheezing  Abdominal: Soft. Bowel sounds are normal. NT. No HSM  Musculoskeletal: Normal range of motion. Exhibits no edema.  Lymphadenopathy:  Has no cervical adenopathy.  Neurological: Pt is alert and oriented to person, place, and time. Pt has normal reflexes. No cranial nerve deficit. Motor grossly intact Skin: Skin is warm and dry. No rash noted.  Psychiatric:  Has normal mood and affect. Behavior is normal.      Assessment & Plan:

## 2014-08-03 NOTE — Assessment & Plan Note (Signed)

## 2014-08-11 ENCOUNTER — Telehealth: Payer: Self-pay | Admitting: General Practice

## 2014-08-11 ENCOUNTER — Ambulatory Visit (INDEPENDENT_AMBULATORY_CARE_PROVIDER_SITE_OTHER): Payer: 59 | Admitting: Internal Medicine

## 2014-08-11 ENCOUNTER — Encounter: Payer: Self-pay | Admitting: Internal Medicine

## 2014-08-11 VITALS — BP 132/80 | HR 75 | Ht 75.0 in | Wt 306.8 lb

## 2014-08-11 DIAGNOSIS — I4891 Unspecified atrial fibrillation: Secondary | ICD-10-CM | POA: Diagnosis not present

## 2014-08-11 NOTE — Progress Notes (Signed)
HPI [name] returns today after a long absence from our cardiology clinic. He is a very pleasant 59 year old man with a history of pulmonary embolism, and paroxysmal atrial fibrillation after his embolism. He has done well in the interim. He denies syncope. He does have chronic intermittent peripheral edema. He is obese. He notes occasional discomfort in his calves.  He has gained weight. We discussed switching to a NOAC. No Known Allergies   Current Outpatient Prescriptions  Medication Sig Dispense Refill  . metoprolol succinate (TOPROL-XL) 25 MG 24 hr tablet TAKE 1 TABLET BY MOUTH DAILY 90 tablet 0  . vardenafil (LEVITRA) 20 MG tablet Take 1 tablet (20 mg total) by mouth as needed for erectile dysfunction. 10 tablet 11  . warfarin (COUMADIN) 4 MG tablet Take as directed by anticoagulation clinic 180 tablet 1   No current facility-administered medications for this visit.     Past Medical History  Diagnosis Date  . DVT (deep venous thrombosis)     2000  . Pulmonary embolism   . A-fib     post PE only  . GERD (gastroesophageal reflux disease)   . Hypertension   . Anxiety   . Symptomatic PVCs   . Erectile dysfunction 05/28/2011    ROS:   All systems reviewed and negative except as noted in the HPI.   Past Surgical History  Procedure Laterality Date  . Vein surgery      RLE saphenous vein stripping  . Tonsillectomy       Family History  Problem Relation Age of Onset  . Diabetes      first degree relatives  . Heart attack Father      History   Social History  . Marital Status: Married    Spouse Name: N/A  . Number of Children: 2  . Years of Education: N/A   Occupational History  . 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 Smoking status: Former Games developermoker  . Smokeless tobacco: Not on file  . Alcohol Use: No  . Drug Use: No  . Sexual Activity: Not on file   Other Topics Concern  . 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  HEART:  Regular rate rhythm, no murmurs, no rubs, no clicks Abd:  soft, positive bowel sounds, no organomegally, no rebound, no guarding Ext:  2 plus pulses, 1+  edema, no cyanosis, no clubbing Skin:  No rashes no nodules Neuro:  CN II through XII intact, motor grossly intact  EKG  normal sinus rhythm with first degree AV block   Assess/Plan:

## 2014-08-11 NOTE — Telephone Encounter (Signed)
Patient would like to come in to see you. Could you please call him to arrange a good time for him to come in

## 2014-08-11 NOTE — Patient Instructions (Signed)
Your physician recommends that you continue on your current medications as directed. Please refer to the Current Medication list given to you today.  Your physician wants you to follow-up in: 12 months with Dr. Taylor.  You will receive a reminder letter in the mail two months in advance. If you don't receive a letter, please call our office to schedule the follow-up appointment.  

## 2014-09-05 ENCOUNTER — Other Ambulatory Visit: Payer: Self-pay | Admitting: Internal Medicine

## 2014-09-06 ENCOUNTER — Other Ambulatory Visit: Payer: Self-pay | Admitting: Internal Medicine

## 2014-09-06 MED ORDER — METOPROLOL SUCCINATE ER 25 MG PO TB24: 25.0000 mg | ORAL_TABLET | Freq: Every day | ORAL | Status: DC

## 2014-09-06 NOTE — Addendum Note (Signed)
Addended by: Deatra JamesBRAND, Dondra Rhett M on: 09/06/2014 09:43 AM   Modules accepted: Orders

## 2014-09-13 ENCOUNTER — Ambulatory Visit (INDEPENDENT_AMBULATORY_CARE_PROVIDER_SITE_OTHER): Payer: 59 | Admitting: General Practice

## 2014-09-13 ENCOUNTER — Other Ambulatory Visit: Payer: Self-pay | Admitting: General Practice

## 2014-09-13 DIAGNOSIS — Z7901 Long term (current) use of anticoagulants: Secondary | ICD-10-CM

## 2014-09-13 DIAGNOSIS — Z5181 Encounter for therapeutic drug level monitoring: Secondary | ICD-10-CM

## 2014-09-13 LAB — POCT INR: INR: 2.3

## 2014-09-13 MED ORDER — WARFARIN SODIUM 4 MG PO TABS: ORAL_TABLET | ORAL | Status: DC

## 2014-09-13 NOTE — Progress Notes (Signed)
Pre visit review using our clinic review tool, if applicable. No additional management support is needed unless otherwise documented below in the visit note. 

## 2014-09-13 NOTE — Progress Notes (Signed)
Agree with plan 

## 2014-10-25 ENCOUNTER — Ambulatory Visit (INDEPENDENT_AMBULATORY_CARE_PROVIDER_SITE_OTHER): Payer: 59 | Admitting: *Deleted

## 2014-10-25 DIAGNOSIS — Z5181 Encounter for therapeutic drug level monitoring: Secondary | ICD-10-CM | POA: Diagnosis not present

## 2014-10-25 DIAGNOSIS — Z7901 Long term (current) use of anticoagulants: Secondary | ICD-10-CM | POA: Diagnosis not present

## 2014-10-25 LAB — POCT INR: INR: 2.4

## 2014-10-25 NOTE — Progress Notes (Signed)
Pre visit review using our clinic review tool, if applicable. No additional management support is needed unless otherwise documented below in the visit note. 

## 2014-10-26 NOTE — Progress Notes (Signed)
I have reviewed and agree with the plan. 

## 2014-11-29 ENCOUNTER — Ambulatory Visit (INDEPENDENT_AMBULATORY_CARE_PROVIDER_SITE_OTHER): Payer: 59 | Admitting: General Practice

## 2014-11-29 DIAGNOSIS — Z7901 Long term (current) use of anticoagulants: Secondary | ICD-10-CM | POA: Diagnosis not present

## 2014-11-29 DIAGNOSIS — Z5181 Encounter for therapeutic drug level monitoring: Secondary | ICD-10-CM | POA: Diagnosis not present

## 2014-11-29 DIAGNOSIS — I4891 Unspecified atrial fibrillation: Secondary | ICD-10-CM

## 2014-11-29 LAB — POCT INR: INR: 2.3

## 2014-11-29 NOTE — Progress Notes (Signed)
Pre visit review using our clinic review tool, if applicable. No additional management support is needed unless otherwise documented below in the visit note. 

## 2014-11-29 NOTE — Progress Notes (Signed)
I have reviewed and agree with the plan. 

## 2015-01-10 ENCOUNTER — Ambulatory Visit (INDEPENDENT_AMBULATORY_CARE_PROVIDER_SITE_OTHER): Payer: Commercial Managed Care - HMO | Admitting: General Practice

## 2015-01-10 DIAGNOSIS — Z5181 Encounter for therapeutic drug level monitoring: Secondary | ICD-10-CM | POA: Diagnosis not present

## 2015-01-10 DIAGNOSIS — I4891 Unspecified atrial fibrillation: Secondary | ICD-10-CM

## 2015-01-10 LAB — POCT INR: INR: 2

## 2015-01-10 NOTE — Progress Notes (Signed)
Pre visit review using our clinic review tool, if applicable. No additional management support is needed unless otherwise documented below in the visit note. 

## 2015-01-10 NOTE — Progress Notes (Signed)
I have reviewed and agree with the plan. 

## 2015-01-18 ENCOUNTER — Ambulatory Visit (INDEPENDENT_AMBULATORY_CARE_PROVIDER_SITE_OTHER): Payer: Commercial Managed Care - HMO | Admitting: Internal Medicine

## 2015-01-18 ENCOUNTER — Encounter: Payer: Self-pay | Admitting: Internal Medicine

## 2015-01-18 VITALS — BP 136/86 | HR 81 | Temp 98.7°F | Ht 75.0 in | Wt 296.0 lb

## 2015-01-18 DIAGNOSIS — F411 Generalized anxiety disorder: Secondary | ICD-10-CM | POA: Diagnosis not present

## 2015-01-18 DIAGNOSIS — I10 Essential (primary) hypertension: Secondary | ICD-10-CM

## 2015-01-18 DIAGNOSIS — J019 Acute sinusitis, unspecified: Secondary | ICD-10-CM

## 2015-01-18 MED ORDER — AZITHROMYCIN 250 MG PO TABS: ORAL_TABLET | ORAL | Status: DC

## 2015-01-18 NOTE — Progress Notes (Signed)
Pre visit review using our clinic review tool, if applicable. No additional management support is needed unless otherwise documented below in the visit note. 

## 2015-01-18 NOTE — Assessment & Plan Note (Signed)
Mild to mod, for antibx course,  to f/u any worsening symptoms or concerns 

## 2015-01-18 NOTE — Patient Instructions (Addendum)
Please take all new medication as prescribed - the antibiotic  Please continue all other medications as before, and refills have been done if requested.  Please have the pharmacy call with any other refills you may need.  Please continue your efforts at being more active, low cholesterol diet, and weight control.  Please keep your appointments with your specialists as you may have planned  Please return about March 2017, or sooner if needed, with Lab testing done 3-5 days before

## 2015-01-18 NOTE — Assessment & Plan Note (Signed)
stable overall by history and exam, recent data reviewed with pt, and pt to continue medical treatment as before,  to f/u any worsening symptoms or concerns Lab Results  Component Value Date   WBC 5.9 08/03/2014   HGB 13.7 08/03/2014   HCT 39.6 08/03/2014   PLT 224.0 08/03/2014   GLUCOSE 96 08/03/2014   CHOL 174 08/03/2014   TRIG 324.0* 08/03/2014   HDL 35.20* 08/03/2014   LDLDIRECT 71.0 08/03/2014   ALT 25 08/03/2014   AST 19 08/03/2014   NA 138 08/03/2014   K 4.1 08/03/2014   CL 103 08/03/2014   CREATININE 1.01 08/03/2014   BUN 12 08/03/2014   CO2 31 08/03/2014   TSH 2.12 08/03/2014   PSA 1.84 08/03/2014   INR 2.0 01/10/2015

## 2015-01-18 NOTE — Progress Notes (Signed)
   Subjective:    Patient ID: Justin Woodard, male    DOB: 1956/04/02, 59 y.o.   MRN: [id number]  HPI   Here with 2-3 days acute onset fever, facial pain, pressure, headache, general weakness and malaise, and greenish d/c, with mild ST and cough, but pt denies chest pain, wheezing, increased sob or doe, orthopnea, PND, increased LE swelling, palpitations, dizziness or syncope.  Denies worsening depressive symptoms, suicidal ideation, or panic; has ongoing anxiety, not increased recently.   Pt denies new neurological symptoms such as new headache, or facial or extremity weakness or numbness   Pt denies polydipsia, polyuria Past Medical History  Diagnosis Date  . DVT (deep venous thrombosis)     2000  . Pulmonary embolism   . A-fib     post PE only  . GERD (gastroesophageal reflux disease)   . Hypertension   . Anxiety   . Symptomatic PVCs   . Erectile dysfunction 05/28/2011   Past Surgical History  Procedure Laterality Date  . Vein surgery      RLE saphenous vein stripping  . Tonsillectomy      reports that he has quit smoking. He does not have any smokeless tobacco history on file. He reports that he does not drink alcohol or use illicit drugs. family history includes Diabetes in an other family member; Heart attack in his father. No Known Allergies Current Outpatient Prescriptions on File Prior to Visit  Medication Sig Dispense Refill  . metoprolol succinate (TOPROL-XL) 25 MG 24 hr tablet TAKE 1 TABLET BY MOUTH DAILY 90 tablet 3  . vardenafil (LEVITRA) 20 MG tablet Take 1 tablet (20 mg total) by mouth as needed for erectile dysfunction. 10 tablet 11  . warfarin (COUMADIN) 4 MG tablet Take as directed by anticoagulation clinic 180 tablet 1   No current facility-administered medications on file prior to visit.   Review of Systems  Constitutional: Negative for unusual diaphoresis or night sweats HENT: Negative for ringing in ear or discharge Eyes: Negative for double vision or  worsening visual disturbance.  Respiratory: Negative for choking and stridor.   Gastrointestinal: Negative for vomiting or other signifcant bowel change Genitourinary: Negative for hematuria or change in urine volume.  Musculoskeletal: Negative for other MSK pain or swelling Skin: Negative for color change and worsening wound.  Neurological: Negative for tremors and numbness other than noted  Psychiatric/Behavioral: Negative for decreased concentration or agitation other than above       Objective:   Physical Exam BP 136/86 mmHg  Pulse 81  Temp(Src) 98.7 F (37.1 C) (Oral)  Ht  (1.905 m)  Wt 296 lb (134.265 kg)  BMI 37.00 kg/m2  SpO2 96% VS noted,  Constitutional: Pt appears in no significant distress HENT: Head: NCAT.  Right Ear: External ear normal.  Left Ear: External ear normal.  Eyes: . Pupils are equal, round, and reactive to light. Conjunctivae and EOM are normal Bilat tm's with mild erythema.  Max sinus areas mod tender.  Pharynx with mild erythema, no exudate  Neck: Normal range of motion. Neck supple. with bilat submandib LA  Cardiovascular: Normal rate and regular rhythm.   Pulmonary/Chest: Effort normal and breath sounds without rales or wheezing.  Neurological: Pt is alert. Not confused , motor grossly intact Skin: Skin is warm. No rash, no LE edema Psychiatric: Pt behavior is normal. No agitation. not depressed affect, mild nervous    Assessment & Plan:

## 2015-01-18 NOTE — Assessment & Plan Note (Signed)
stable overall by history and exam, recent data reviewed with pt, and pt to continue medical treatment as before,  to f/u any worsening symptoms or concerns BP Readings from Last 3 Encounters:  01/18/15 136/86  08/11/14 132/80  08/03/14 136/88

## 2015-01-23 ENCOUNTER — Encounter (HOSPITAL_COMMUNITY): Payer: Self-pay | Admitting: Emergency Medicine

## 2015-01-23 ENCOUNTER — Emergency Department (HOSPITAL_COMMUNITY): Payer: Commercial Managed Care - HMO

## 2015-01-23 ENCOUNTER — Emergency Department (HOSPITAL_COMMUNITY)
Admission: EM | Admit: 2015-01-23 | Discharge: 2015-01-23 | Disposition: A | Discharge status: Home / Self Care | Payer: Commercial Managed Care - HMO | Attending: Emergency Medicine | Admitting: Emergency Medicine

## 2015-01-23 DIAGNOSIS — R0602 Shortness of breath: Secondary | ICD-10-CM | POA: Diagnosis present

## 2015-01-23 DIAGNOSIS — Z87891 Personal history of nicotine dependence: Secondary | ICD-10-CM | POA: Insufficient documentation

## 2015-01-23 DIAGNOSIS — R0981 Nasal congestion: Secondary | ICD-10-CM | POA: Insufficient documentation

## 2015-01-23 DIAGNOSIS — I10 Essential (primary) hypertension: Secondary | ICD-10-CM | POA: Diagnosis not present

## 2015-01-23 DIAGNOSIS — N529 Male erectile dysfunction, unspecified: Secondary | ICD-10-CM | POA: Diagnosis not present

## 2015-01-23 DIAGNOSIS — R0600 Dyspnea, unspecified: Secondary | ICD-10-CM | POA: Diagnosis not present

## 2015-01-23 DIAGNOSIS — Z8719 Personal history of other diseases of the digestive system: Secondary | ICD-10-CM | POA: Insufficient documentation

## 2015-01-23 DIAGNOSIS — Z7901 Long term (current) use of anticoagulants: Secondary | ICD-10-CM | POA: Insufficient documentation

## 2015-01-23 DIAGNOSIS — Z8659 Personal history of other mental and behavioral disorders: Secondary | ICD-10-CM | POA: Diagnosis not present

## 2015-01-23 DIAGNOSIS — Z86718 Personal history of other venous thrombosis and embolism: Secondary | ICD-10-CM | POA: Diagnosis not present

## 2015-01-23 DIAGNOSIS — I4891 Unspecified atrial fibrillation: Secondary | ICD-10-CM | POA: Diagnosis not present

## 2015-01-23 DIAGNOSIS — Z79899 Other long term (current) drug therapy: Secondary | ICD-10-CM | POA: Insufficient documentation

## 2015-01-23 DIAGNOSIS — Z86711 Personal history of pulmonary embolism: Secondary | ICD-10-CM | POA: Diagnosis not present

## 2015-01-23 DIAGNOSIS — R42 Dizziness and giddiness: Secondary | ICD-10-CM | POA: Diagnosis not present

## 2015-01-23 LAB — CBC
HCT: 41.3 % (ref 39.0–52.0)
Hemoglobin: 14.1 g/dL (ref 13.0–17.0)
MCH: 29.9 pg (ref 26.0–34.0)
MCHC: 34.1 g/dL (ref 30.0–36.0)
MCV: 87.7 fL (ref 78.0–100.0)
PLATELETS: 199 10*3/uL (ref 150–400)
RBC: 4.71 MIL/uL (ref 4.22–5.81)
RDW: 13 % (ref 11.5–15.5)
WBC: 5.2 10*3/uL (ref 4.0–10.5)

## 2015-01-23 LAB — BASIC METABOLIC PANEL
ANION GAP: 7 (ref 5–15)
BUN: 11 mg/dL (ref 6–20)
CALCIUM: 8.7 mg/dL — AB (ref 8.9–10.3)
CO2: 24 mmol/L (ref 22–32)
CREATININE: 0.99 mg/dL (ref 0.61–1.24)
Chloride: 106 mmol/L (ref 101–111)
Glucose, Bld: 133 mg/dL — ABNORMAL HIGH (ref 65–99)
Potassium: 4 mmol/L (ref 3.5–5.1)
SODIUM: 137 mmol/L (ref 135–145)

## 2015-01-23 LAB — PROTIME-INR
INR: 1.81 — AB (ref 0.00–1.49)
Prothrombin Time: 20.9 seconds — ABNORMAL HIGH (ref 11.6–15.2)

## 2015-01-23 LAB — I-STAT TROPONIN, ED
TROPONIN I, POC: 0 ng/mL (ref 0.00–0.08)
Troponin i, poc: 0 ng/mL (ref 0.00–0.08)

## 2015-01-23 LAB — D-DIMER, QUANTITATIVE: D-Dimer, Quant: <0.27 ug/mL-FEU (ref 0.00–0.48)

## 2015-01-23 NOTE — Discharge Instructions (Signed)
Call Dr. Raphael Gibney office tomorrow to schedule an office visit. Asked him to perform a testhemoglobin A1c to check you for diabetes. Your blood sugar today was mildly elevated at 133. Your Coumadin level (INR) was slightly low at 1.81. Your INR should be rechecked within a week .Return if concerned for any reason

## 2015-01-23 NOTE — ED Notes (Signed)
Pt states hx of sinus pressure for 2 weeks today is c/o dizziness of waking.

## 2015-01-23 NOTE — ED Notes (Addendum)
Pt with Hx of PE c/o SOB, dizziness, feeling tired and cold onset last night, pt states he has had epigastric pain and feeling of "fullness" the past two days, denies pain currently. Lung sounds clear throughout.

## 2015-01-23 NOTE — ED Provider Notes (Signed)
CSN: [id number]     Arrival date & time 01/23/15  1324 History   First MD Initiated Contact with Patient 01/23/15 1030     Chief Complaint  Patient presents with  . Shortness of Breath     (Consider location/radiation/quality/duration/timing/severity/associated sxs/prior Treatment) HPI Complains of shortness of breath onset upon awakening this morning with feeling of general malaise. And a fullness at his epigastric area. Symptoms resolved spontaneously,, however he feels slightly dyspneic as he is speaking to me. He also complains of nasal congestion for the past 2 weeks, recently treated with Z-Pak by his primary care physician, Dr. Jonny Ruiz for "ear infection". He denies chest pain no focal numbness or weakness no blood per rectum. He also reports with feeling slightly off balance with walking for the past 1 month, however does not feel off balance today. No other associated symptoms. Past Medical History  Diagnosis Date  . DVT (deep venous thrombosis)     2000  . Pulmonary embolism   . A-fib     post PE only  . GERD (gastroesophageal reflux disease)   . Hypertension   . Anxiety   . Symptomatic PVCs   . Erectile dysfunction 05/28/2011   Past Surgical History  Procedure Laterality Date  . Vein surgery      RLE saphenous vein stripping  . Tonsillectomy     Family History  Problem Relation Age of Onset  . Diabetes      first degree relatives  . Heart attack Father    Social History  Substance Use Topics  . Smoking status: Former Games developer  . Smokeless tobacco: None  . Alcohol Use: No    Review of Systems  Constitutional: Negative.   HENT: Positive for congestion.   Respiratory: Positive for shortness of breath.   Cardiovascular: Negative.   Gastrointestinal: Negative.        Fullness at epigastric  Musculoskeletal: Negative.   Skin: Negative.   Neurological: Positive for dizziness.  Psychiatric/Behavioral: Negative.   All other systems reviewed and are  negative.     Allergies  Review of patient's allergies indicates no known allergies.  Home Medications   Prior to Admission medications   Medication Sig Start Date End Date Taking? Authorizing Provider  metoprolol succinate (TOPROL-XL) 25 MG 24 hr tablet TAKE 1 TABLET BY MOUTH DAILY 09/06/14  Yes Corwin Levins, MD  warfarin (COUMADIN) 4 MG tablet Take as directed by anticoagulation clinic Patient taking differently: Take 6-8 mg by mouth every evening. 6 mg on Monday and Friday. 8mg  on all other days 09/13/14  Yes Corwin Levins, MD  azithromycin [facility] Z-PAK) 250 MG tablet Use as directed Patient not taking: Reported on 01/23/2015 01/18/15   Corwin Levins, MD  vardenafil (LEVITRA) 20 MG tablet Take 1 tablet (20 mg total) by mouth as needed for erectile dysfunction. Patient not taking: Reported on 01/23/2015 08/03/14   Corwin Levins, MD   BP 134/62 mmHg  Pulse 68  Temp(Src) 98.1 F (36.7 C) (Oral)  Resp 20  SpO2 98% Physical Exam  Constitutional: He is oriented to person, place, and time. He appears well-developed and well-nourished. No distress.  HENT:  Head: Normocephalic and atraumatic.  Right Ear: External ear normal.  Left Ear: External ear normal.  Nose: Nose normal.  Mouth/Throat: No oropharyngeal exudate.  The lateral tympanic membranes normal  Eyes: Conjunctivae are normal. Pupils are equal, round, and reactive to light.  Neck: Neck supple. No tracheal deviation present. No thyromegaly present.  Cardiovascular: Normal rate and regular rhythm.   No murmur heard. Pulmonary/Chest: Effort normal and breath sounds normal.  Abdominal: Soft. Bowel sounds are normal. He exhibits no distension. There is no tenderness.  Musculoskeletal: Normal range of motion. He exhibits no edema or tenderness.  Neurological: He is alert and oriented to person, place, and time. No cranial nerve deficit. Coordination normal.  Skin: Skin is warm and dry. No rash noted.  Psychiatric: He has a normal  mood and affect.  Nursing note and vitals reviewed.   ED Course  Procedures (including critical care time) Labs Review Labs Reviewed  CBC  BASIC METABOLIC PANEL  I-STAT TROPOININ, ED    Imaging Review No results found. I have personally reviewed and evaluated these images and lab results as part of my medical decision-making.   EKG Interpretation   Date/Time:  Monday January 23 2015 09:47:54 EDT Ventricular Rate:  67 PR Interval:  210 QRS Duration: 109 QT Interval:  424 QTC Calculation: 448 R Axis:   61 Text Interpretation:  Sinus rhythm Baseline wander in lead(s) V6 No  significant change since last tracing Confirmed by Ethelda Chick  MD, Ciin Brazzel  [phone]) on 01/23/2015 10:48:12 AM      Results for orders placed or performed during the hospital encounter of 01/23/15  Basic metabolic panel  Result Value Ref Range   Sodium 137 135 - 145 mmol/L   Potassium 4.0 3.5 - 5.1 mmol/L   Chloride 106 101 - 111 mmol/L   CO2 24 22 - 32 mmol/L   Glucose, Bld 133 (H) 65 - 99 mg/dL   BUN 11 6 - 20 mg/dL   Creatinine, Ser 6.04 0.61 - 1.24 mg/dL   Calcium 8.7 (L) 8.9 - 10.3 mg/dL   GFR calc non Af Amer >60 >60 mL/min   GFR calc Af Amer >60 >60 mL/min   Anion gap 7 5 - 15  CBC  Result Value Ref Range   WBC 5.2 4.0 - 10.5 K/uL   RBC 4.71 4.22 - 5.81 MIL/uL   Hemoglobin 14.1 13.0 - 17.0 g/dL   HCT 54.0 98.1 - 19.1 %   MCV 87.7 78.0 - 100.0 fL   MCH 29.9 26.0 - 34.0 pg   MCHC 34.1 30.0 - 36.0 g/dL   RDW 47.8 29.5 - 62.1 %   Platelets 199 150 - 400 K/uL  Protime-INR  Result Value Ref Range   Prothrombin Time 20.9 (H) 11.6 - 15.2 seconds   INR 1.81 (H) 0.00 - 1.49  D-dimer, quantitative (not at [facility])  Result Value Ref Range   D-Dimer, Quant <0.27 0.00 - 0.48 ug/mL-FEU  I-stat troponin, ED  Result Value Ref Range   Troponin i, poc 0.00 0.00 - 0.08 ng/mL   Comment 3          I-stat troponin, ED  Result Value Ref Range   Troponin i, poc 0.00 0.00 - 0.08 ng/mL   Comment 3            Dg Chest 2 View  01/23/2015   CLINICAL DATA:  59 year old male with history of chest pressure and shortness of breath for 1 day. Prior history of pulmonary embolism.  EXAM: CHEST  2 VIEW  COMPARISON:  Chest x-ray 05/17/2014.  FINDINGS: Lung volumes are normal. No consolidative airspace disease. No pleural effusions. No pneumothorax. No pulmonary nodule or mass noted. Pulmonary vasculature and the cardiomediastinal silhouette are within normal limits.  IMPRESSION: No radiographic evidence of acute cardiopulmonary disease.   Electronically Signed  By: Trudie Reed M.D.   On: 01/23/2015 10:56    1:30 PM patient asymptomatic. Chest x-ray reviewed by me. MDM   Plan follow-up with Dr.John this week. Suggest redraw of INR, hemoglobin A1c. Doubt acute coronary syndrome symptoms highly atypical patient with nasal congestion, no chest pain. Low pretest probability for pulmonary embolism. Negative d-dimer Diagnosis #1 dyspnea #2 hyperglycemia #3 subtherapeutic INR  Final diagnoses:  None        Doug Sou, MD 01/23/15 1332

## 2015-01-23 NOTE — ED Notes (Signed)
MD at bedside. 

## 2015-01-23 NOTE — ED Notes (Signed)
Pt alert x4 respirations easy non labored.  

## 2015-01-24 ENCOUNTER — Encounter: Payer: Self-pay | Admitting: Internal Medicine

## 2015-01-24 ENCOUNTER — Ambulatory Visit (INDEPENDENT_AMBULATORY_CARE_PROVIDER_SITE_OTHER): Payer: Commercial Managed Care - HMO | Admitting: General Practice

## 2015-01-24 DIAGNOSIS — Z5181 Encounter for therapeutic drug level monitoring: Secondary | ICD-10-CM

## 2015-01-24 DIAGNOSIS — I4891 Unspecified atrial fibrillation: Secondary | ICD-10-CM

## 2015-01-24 LAB — POCT INR: INR: 2

## 2015-01-24 NOTE — Progress Notes (Signed)
Pre visit review using our clinic review tool, if applicable. No additional management support is needed unless otherwise documented below in the visit note. 

## 2015-01-24 NOTE — Progress Notes (Signed)
I have reviewed and agree with the plan. 

## 2015-02-07 ENCOUNTER — Ambulatory Visit (INDEPENDENT_AMBULATORY_CARE_PROVIDER_SITE_OTHER): Payer: Commercial Managed Care - HMO | Admitting: General Practice

## 2015-02-07 DIAGNOSIS — Z5181 Encounter for therapeutic drug level monitoring: Secondary | ICD-10-CM | POA: Diagnosis not present

## 2015-02-07 DIAGNOSIS — Z7901 Long term (current) use of anticoagulants: Secondary | ICD-10-CM | POA: Diagnosis not present

## 2015-02-07 DIAGNOSIS — I4891 Unspecified atrial fibrillation: Secondary | ICD-10-CM | POA: Diagnosis not present

## 2015-02-07 LAB — POCT INR: INR: 2.3

## 2015-02-07 NOTE — Progress Notes (Signed)
Pre visit review using our clinic review tool, if applicable. No additional management support is needed unless otherwise documented below in the visit note. 

## 2015-02-07 NOTE — Progress Notes (Signed)
I have reviewed and agree with the plan. 

## 2015-02-21 ENCOUNTER — Ambulatory Visit: Payer: Commercial Managed Care - HMO

## 2015-03-07 ENCOUNTER — Ambulatory Visit (INDEPENDENT_AMBULATORY_CARE_PROVIDER_SITE_OTHER): Payer: Commercial Managed Care - HMO | Admitting: General Practice

## 2015-03-07 DIAGNOSIS — Z7901 Long term (current) use of anticoagulants: Secondary | ICD-10-CM

## 2015-03-07 DIAGNOSIS — Z5181 Encounter for therapeutic drug level monitoring: Secondary | ICD-10-CM | POA: Diagnosis not present

## 2015-03-07 DIAGNOSIS — I4891 Unspecified atrial fibrillation: Secondary | ICD-10-CM

## 2015-03-07 LAB — POCT INR: INR: 2.2

## 2015-03-07 NOTE — Progress Notes (Signed)
I have reviewed and agree with the plan. 

## 2015-03-07 NOTE — Progress Notes (Signed)
Pre visit review using our clinic review tool, if applicable. No additional management support is needed unless otherwise documented below in the visit note. 

## 2015-03-18 ENCOUNTER — Other Ambulatory Visit: Payer: Self-pay | Admitting: Internal Medicine

## 2015-03-20 ENCOUNTER — Other Ambulatory Visit: Payer: Self-pay | Admitting: General Practice

## 2015-03-20 MED ORDER — WARFARIN SODIUM 4 MG PO TABS: ORAL_TABLET | ORAL | Status: DC

## 2015-04-16 ENCOUNTER — Emergency Department (HOSPITAL_COMMUNITY)
Admission: EM | Admit: 2015-04-16 | Discharge: 2015-04-16 | Disposition: A | Discharge status: Home / Self Care | Payer: Commercial Managed Care - HMO | Attending: Emergency Medicine | Admitting: Emergency Medicine

## 2015-04-16 ENCOUNTER — Emergency Department (HOSPITAL_COMMUNITY): Payer: Commercial Managed Care - HMO

## 2015-04-16 ENCOUNTER — Encounter (HOSPITAL_COMMUNITY): Payer: Self-pay | Admitting: Emergency Medicine

## 2015-04-16 DIAGNOSIS — Z87891 Personal history of nicotine dependence: Secondary | ICD-10-CM | POA: Diagnosis not present

## 2015-04-16 DIAGNOSIS — Z86711 Personal history of pulmonary embolism: Secondary | ICD-10-CM | POA: Insufficient documentation

## 2015-04-16 DIAGNOSIS — R0602 Shortness of breath: Secondary | ICD-10-CM | POA: Insufficient documentation

## 2015-04-16 DIAGNOSIS — N529 Male erectile dysfunction, unspecified: Secondary | ICD-10-CM | POA: Insufficient documentation

## 2015-04-16 DIAGNOSIS — Z8659 Personal history of other mental and behavioral disorders: Secondary | ICD-10-CM | POA: Diagnosis not present

## 2015-04-16 DIAGNOSIS — Z86718 Personal history of other venous thrombosis and embolism: Secondary | ICD-10-CM | POA: Insufficient documentation

## 2015-04-16 DIAGNOSIS — I10 Essential (primary) hypertension: Secondary | ICD-10-CM | POA: Insufficient documentation

## 2015-04-16 DIAGNOSIS — R1012 Left upper quadrant pain: Secondary | ICD-10-CM | POA: Insufficient documentation

## 2015-04-16 DIAGNOSIS — I4891 Unspecified atrial fibrillation: Secondary | ICD-10-CM | POA: Insufficient documentation

## 2015-04-16 DIAGNOSIS — Z7901 Long term (current) use of anticoagulants: Secondary | ICD-10-CM | POA: Diagnosis not present

## 2015-04-16 DIAGNOSIS — K219 Gastro-esophageal reflux disease without esophagitis: Secondary | ICD-10-CM | POA: Insufficient documentation

## 2015-04-16 DIAGNOSIS — R140 Abdominal distension (gaseous): Secondary | ICD-10-CM | POA: Insufficient documentation

## 2015-04-16 DIAGNOSIS — R110 Nausea: Secondary | ICD-10-CM | POA: Insufficient documentation

## 2015-04-16 DIAGNOSIS — R1013 Epigastric pain: Secondary | ICD-10-CM

## 2015-04-16 DIAGNOSIS — Z79899 Other long term (current) drug therapy: Secondary | ICD-10-CM | POA: Insufficient documentation

## 2015-04-16 DIAGNOSIS — R197 Diarrhea, unspecified: Secondary | ICD-10-CM

## 2015-04-16 DIAGNOSIS — R109 Unspecified abdominal pain: Secondary | ICD-10-CM | POA: Diagnosis present

## 2015-04-16 LAB — URINALYSIS, ROUTINE W REFLEX MICROSCOPIC
Bilirubin Urine: NEGATIVE
GLUCOSE, UA: NEGATIVE mg/dL
Ketones, ur: NEGATIVE mg/dL
LEUKOCYTES UA: NEGATIVE
NITRITE: NEGATIVE
PH: 5.5 (ref 5.0–8.0)
Protein, ur: NEGATIVE mg/dL
SPECIFIC GRAVITY, URINE: 1.024 (ref 1.005–1.030)

## 2015-04-16 LAB — COMPREHENSIVE METABOLIC PANEL
ALBUMIN: 4.1 g/dL (ref 3.5–5.0)
ALT: 27 U/L (ref 17–63)
ANION GAP: 9 (ref 5–15)
AST: 24 U/L (ref 15–41)
Alkaline Phosphatase: 62 U/L (ref 38–126)
BILIRUBIN TOTAL: 1.6 mg/dL — AB (ref 0.3–1.2)
BUN: 18 mg/dL (ref 6–20)
CHLORIDE: 105 mmol/L (ref 101–111)
CO2: 25 mmol/L (ref 22–32)
Calcium: 9.5 mg/dL (ref 8.9–10.3)
Creatinine, Ser: 1.23 mg/dL (ref 0.61–1.24)
GFR calc Af Amer: >60 mL/min (ref >60)
GFR calc non Af Amer: >60 mL/min (ref >60)
GLUCOSE: 160 mg/dL — AB (ref 65–99)
POTASSIUM: 4.2 mmol/L (ref 3.5–5.1)
SODIUM: 139 mmol/L (ref 135–145)
TOTAL PROTEIN: 7.3 g/dL (ref 6.5–8.1)

## 2015-04-16 LAB — CBC WITH DIFFERENTIAL/PLATELET
BASOS ABS: 0 10*3/uL (ref 0.0–0.1)
BASOS PCT: 0 %
Eosinophils Absolute: 0 10*3/uL (ref 0.0–0.7)
Eosinophils Relative: 0 %
HEMATOCRIT: 44.3 % (ref 39.0–52.0)
HEMOGLOBIN: 15 g/dL (ref 13.0–17.0)
LYMPHS PCT: 2 %
Lymphs Abs: 0.2 10*3/uL — ABNORMAL LOW (ref 0.7–4.0)
MCH: 29.6 pg (ref 26.0–34.0)
MCHC: 33.9 g/dL (ref 30.0–36.0)
MCV: 87.4 fL (ref 78.0–100.0)
MONO ABS: 0.3 10*3/uL (ref 0.1–1.0)
MONOS PCT: 3 %
NEUTROS ABS: 7.9 10*3/uL — AB (ref 1.7–7.7)
NEUTROS PCT: 95 %
Platelets: 185 10*3/uL (ref 150–400)
RBC: 5.07 MIL/uL (ref 4.22–5.81)
RDW: 12.7 % (ref 11.5–15.5)
WBC: 8.3 10*3/uL (ref 4.0–10.5)

## 2015-04-16 LAB — URINE MICROSCOPIC-ADD ON

## 2015-04-16 LAB — PROTIME-INR
INR: 1.72 — AB (ref 0.00–1.49)
Prothrombin Time: 20.1 seconds — ABNORMAL HIGH (ref 11.6–15.2)

## 2015-04-16 LAB — LIPASE, BLOOD: Lipase: 36 U/L (ref 11–51)

## 2015-04-16 MED ORDER — ALUM & MAG HYDROXIDE-SIMETH 200-200-20 MG/5ML PO SUSP
30.0000 mL | Freq: Once | ORAL | Status: AC
  Administered 2015-04-16: 30 mL via ORAL
  Filled 2015-04-16: qty 30

## 2015-04-16 MED ORDER — LANSOPRAZOLE 15 MG PO TBDP: 15.0000 mg | ORAL_TABLET | Freq: Every day | ORAL | Status: DC

## 2015-04-16 MED ORDER — SODIUM CHLORIDE 0.9 % IV BOLUS (SEPSIS)
1000.0000 mL | Freq: Once | INTRAVENOUS | Status: AC
  Administered 2015-04-16: 1000 mL via INTRAVENOUS

## 2015-04-16 NOTE — ED Notes (Signed)
Pt taken to x-ray and returned to room without distress noted. 

## 2015-04-16 NOTE — ED Notes (Signed)
Awake. Verbally responsive. A/O x4. Resp even and unlabored. No audible adventitious breath sounds noted. ABC's intact. SR on monitor. No report of n/v/d noted. IV infusing NS at 94899ml/hr without difficulty.

## 2015-04-16 NOTE — ED Notes (Signed)
Awake. Verbally responsive. A/O x4. Resp even and unlabored. No audible adventitious breath sounds noted. ABC's intact. IV saline lock patent and intact. 

## 2015-04-16 NOTE — ED Notes (Addendum)
Pt reported sudden onset upper abd pain with diarrhea x6 and nausea without emesis, indigestion, abd bloating and chills s/p to eating out at 1700. Denies fever and bloody stoools. LBM yesterday morning. Abd soft/ slightly distended and tender to palpate.

## 2015-04-16 NOTE — ED Notes (Signed)
Awake. Verbally responsive. A/O x4. Resp even and unlabored. No audible adventitious breath sounds noted. ABC's intact.  

## 2015-04-16 NOTE — ED Notes (Signed)
Awake. Verbally responsive. A/O x4. Resp even and unlabored. No audible adventitious breath sounds noted. ABC's intact. IV saline lock patent and intact. Family at bedside. 

## 2015-04-16 NOTE — ED Provider Notes (Signed)
CSN: [id number]     Arrival date & time 04/16/15  1008 History   First MD Initiated Contact with Patient 04/16/15 1030     Chief Complaint  Patient presents with  . Abdominal Pain     (Consider location/radiation/quality/duration/timing/severity/associated sxs/prior Treatment) HPI Comments: Exceed 59-year-old male with past medical history including hypertension, GERD, atrial fibrillation, PE/DVT on Coumadin who presents with abdominal pain and diarrhea. The patient reports that he ate dinner around 5 PM last night. Later in the evening, he had a lot of summer sausage and cheese with his family. Afterwards he began having severe indigestion symptoms with heartburn sensation which was worse with laying down. The indigestion has been severe and constant throughout the night and kept him awake all night. He endorses associated abdominal bloating and some mild shortness of breath related to the indigestion. He denies any chest pain. He has had nausea but no vomiting, no fevers, no blood in his stool. He has had 4-5 episodes of watery diarrhea since his symptoms began. No sick contacts.  Patient is a 59 y.o. male presenting with abdominal pain. The history is provided by the patient.  Abdominal Pain   Past Medical History  Diagnosis Date  . DVT (deep venous thrombosis) (HCC)     2000  . Pulmonary embolism (HCC)   . A-fib (HCC)     post PE only  . GERD (gastroesophageal reflux disease)   . Hypertension   . Anxiety   . Symptomatic PVCs   . Erectile dysfunction 05/28/2011   Past Surgical History  Procedure Laterality Date  . Vein surgery      RLE saphenous vein stripping  . Tonsillectomy     Family History  Problem Relation Age of Onset  . Diabetes      first degree relatives  . Heart attack Father    Social History  Substance Use Topics  . Smoking status: Former Games developer  . Smokeless tobacco: None  . Alcohol Use: No    Review of Systems  Gastrointestinal: Positive for abdominal  pain.   10 Systems reviewed and are negative for acute change except as noted in the HPI.    Allergies  Oxycodone  Home Medications   Prior to Admission medications   Medication Sig Start Date End Date Taking? Authorizing Provider  metoprolol succinate (TOPROL-XL) 25 MG 24 hr tablet TAKE 1 TABLET BY MOUTH DAILY 09/06/14  Yes Corwin Levins, MD  vardenafil (LEVITRA) 20 MG tablet Take 1 tablet (20 mg total) by mouth as needed for erectile dysfunction. 08/03/14  Yes Corwin Levins, MD  warfarin (COUMADIN) 4 MG tablet Take as directed by anticoagulation clinic Patient taking differently: Take 6-8 mg by mouth daily. 8 mg QD except Friday, take 6 mg 03/20/15  Yes Corwin Levins, MD  lansoprazole (PREVACID SOLUTAB) 15 MG disintegrating tablet Take 1 tablet (15 mg total) by mouth daily at 12 noon. 04/16/15   Ambrose Finland Israel Werts, MD   BP 120/71 mmHg  Pulse 83  Temp(Src) 98.6 F (37 C) (Oral)  Resp 33  Ht  (1.905 m)  Wt 290 lb (131.543 kg)  BMI 36.25 kg/m2  SpO2 95% Physical Exam  Constitutional: He is oriented to person, place, and time. He appears well-developed and well-nourished. No distress.  HENT:  Head: Normocephalic and atraumatic.  Mouth/Throat: Oropharynx is clear and moist.  Moist mucous membranes  Eyes: Conjunctivae are normal. Pupils are equal, round, and reactive to light.  Neck: Neck supple.  Cardiovascular:  Normal rate and regular rhythm.  Exam reveals no friction rub.   No murmur heard. Pulmonary/Chest: Effort normal and breath sounds normal.  Abdominal: Soft. Bowel sounds are normal.  LUQ TTP, no rebound or guarding, mild abd distension  Musculoskeletal: He exhibits no edema.  Neurological: He is alert and oriented to person, place, and time.  Fluent speech  Skin: Skin is warm and dry.  Psychiatric: He has a normal mood and affect. Judgment normal.  Nursing note and vitals reviewed.   ED Course  Procedures (including critical care time) Labs Review Labs  Reviewed  COMPREHENSIVE METABOLIC PANEL - Abnormal; Notable for the following:    Glucose, Bld 160 (*)    Total Bilirubin 1.6 (*)    All other components within normal limits  CBC WITH DIFFERENTIAL/PLATELET - Abnormal; Notable for the following:    Neutro Abs 7.9 (*)    Lymphs Abs 0.2 (*)    All other components within normal limits  URINALYSIS, ROUTINE W REFLEX MICROSCOPIC (NOT AT [facility]) - Abnormal; Notable for the following:    Hgb urine dipstick TRACE (*)    All other components within normal limits  PROTIME-INR - Abnormal; Notable for the following:    Prothrombin Time 20.1 (*)    INR 1.72 (*)    All other components within normal limits  URINE MICROSCOPIC-ADD ON - Abnormal; Notable for the following:    Squamous Epithelial / LPF 0-5 (*)    Bacteria, UA RARE (*)    All other components within normal limits  LIPASE, BLOOD    Imaging Review Dg Chest 2 View  04/16/2015  CLINICAL DATA:  Epigastric pain, shortness of Breath EXAM: CHEST  2 VIEW COMPARISON:  01/23/2015 FINDINGS: The heart size and mediastinal contours are within normal limits. Both lungs are clear. The visualized skeletal structures are unremarkable. IMPRESSION: No active cardiopulmonary disease. Electronically Signed   By: Natasha MeadLiviu  Pop M.D.   On: 04/16/2015 12:14   I have personally reviewed and evaluated these lab results as part of my medical decision-making.   EKG Interpretation   Date/Time:  Sunday April 16 2015 11:29:41 EST Ventricular Rate:  87 PR Interval:  202 QRS Duration: 107 QT Interval:  374 QTC Calculation: 450 R Axis:   65 Text Interpretation:  Sinus rhythm Probable anterolateral infarct, old No  significant change since last tracing Confirmed by Shiann Kam MD, Timothee Gali  [phone]) on 04/16/2015 1:18:09 PM     Medications  sodium chloride 0.9 % bolus 1,000 mL (0 mLs Intravenous Stopped 04/16/15 1140)  alum & mag hydroxide-simeth (MAALOX/MYLANTA) 200-200-20 MG/5ML suspension 30 mL (30 mLs Oral Given  04/16/15 1131)    MDM   Final diagnoses:  Epigastric pain  Diarrhea, unspecified type   Patient presents with midepigastric pain, reflux, and multiple episodes of diarrhea that began last night. On exam, the patient was uncomfortable but nontoxic in appearance. Mild left upper quadrant tenderness without rebound or guarding. Obtained above labs and gave the patient an IV fluid bolus and Maalox. Also obtained EKG and chest x-ray as the patient mentioned some shortness of breath that accompanied the pain and burning. EKG shows no significant change from previous. Chest x-ray unremarkable. Labwork including CMP was normal. The patient states that he was previously on a PPI but had discontinued it after diet modification. His symptoms are similar to previous episodes of reflux. Furthermore, with his diarrhea he may have an acute gastroenteritis. He denies any symptoms of chest pain. I have informed the patient and  his INR is subtherapeutic at 1.7. On reexamination after receiving Maalox, the patient reported mild improvement in his symptoms. Provided with prescription for Prevacid and instructed to restart this medication as well as to follow GERD diet. Reviewed supportive care instructions regarding diarrhea. Discussed return precautions and patient voiced understanding. Patient discharged in satisfactory condition.  Laurence Spates, MD 04/16/15 [phone]

## 2015-04-18 ENCOUNTER — Ambulatory Visit (INDEPENDENT_AMBULATORY_CARE_PROVIDER_SITE_OTHER): Payer: Commercial Managed Care - HMO | Admitting: General Practice

## 2015-04-18 DIAGNOSIS — I4891 Unspecified atrial fibrillation: Secondary | ICD-10-CM | POA: Diagnosis not present

## 2015-04-18 DIAGNOSIS — Z5181 Encounter for therapeutic drug level monitoring: Secondary | ICD-10-CM | POA: Diagnosis not present

## 2015-04-18 DIAGNOSIS — Z7901 Long term (current) use of anticoagulants: Secondary | ICD-10-CM | POA: Diagnosis not present

## 2015-04-18 LAB — POCT INR: INR: 1.8

## 2015-04-18 NOTE — Progress Notes (Signed)
I have reviewed and agree with the plan. 

## 2015-04-18 NOTE — Progress Notes (Signed)
Pre visit review using our clinic review tool, if applicable. No additional management support is needed unless otherwise documented below in the visit note. 

## 2015-05-09 ENCOUNTER — Ambulatory Visit (INDEPENDENT_AMBULATORY_CARE_PROVIDER_SITE_OTHER): Payer: Commercial Managed Care - HMO | Admitting: General Practice

## 2015-05-09 DIAGNOSIS — Z7901 Long term (current) use of anticoagulants: Secondary | ICD-10-CM

## 2015-05-09 DIAGNOSIS — Z5181 Encounter for therapeutic drug level monitoring: Secondary | ICD-10-CM

## 2015-05-09 DIAGNOSIS — I4891 Unspecified atrial fibrillation: Secondary | ICD-10-CM

## 2015-05-09 LAB — POCT INR: INR: 2.5

## 2015-05-09 NOTE — Progress Notes (Signed)
Pre visit review using our clinic review tool, if applicable. No additional management support is needed unless otherwise documented below in the visit note. 

## 2015-05-09 NOTE — Progress Notes (Signed)
I have reviewed and agree with the plan. 

## 2015-05-16 ENCOUNTER — Ambulatory Visit: Payer: Commercial Managed Care - HMO

## 2015-06-06 ENCOUNTER — Ambulatory Visit (INDEPENDENT_AMBULATORY_CARE_PROVIDER_SITE_OTHER): Payer: Commercial Managed Care - HMO | Admitting: General Practice

## 2015-06-06 DIAGNOSIS — Z7901 Long term (current) use of anticoagulants: Secondary | ICD-10-CM

## 2015-06-06 DIAGNOSIS — I4891 Unspecified atrial fibrillation: Secondary | ICD-10-CM | POA: Diagnosis not present

## 2015-06-06 DIAGNOSIS — Z5181 Encounter for therapeutic drug level monitoring: Secondary | ICD-10-CM | POA: Diagnosis not present

## 2015-06-06 LAB — POCT INR: INR: 2.4

## 2015-06-06 NOTE — Progress Notes (Signed)
Pre visit review using our clinic review tool, if applicable. No additional management support is needed unless otherwise documented below in the visit note. 

## 2015-06-06 NOTE — Progress Notes (Signed)
I have reviewed and agree with the plan. 

## 2015-06-21 ENCOUNTER — Ambulatory Visit (INDEPENDENT_AMBULATORY_CARE_PROVIDER_SITE_OTHER): Payer: Commercial Managed Care - HMO | Admitting: Nurse Practitioner

## 2015-06-21 ENCOUNTER — Encounter: Payer: Self-pay | Admitting: Nurse Practitioner

## 2015-06-21 VITALS — BP 124/80 | HR 70 | Temp 98.2°F | Ht 75.0 in | Wt 284.0 lb

## 2015-06-21 DIAGNOSIS — J019 Acute sinusitis, unspecified: Secondary | ICD-10-CM | POA: Insufficient documentation

## 2015-06-21 MED ORDER — AMOXICILLIN-POT CLAVULANATE 875-125 MG PO TABS: 1.0000 | ORAL_TABLET | Freq: Two times a day (BID) | ORAL | Status: DC

## 2015-06-21 MED ORDER — FLUTICASONE PROPIONATE 50 MCG/ACT NA SUSP: 2.0000 | Freq: Every day | NASAL | Status: DC

## 2015-06-21 NOTE — Patient Instructions (Addendum)
Please take the antibiotic as prescribed and the flonase.

## 2015-06-21 NOTE — Progress Notes (Signed)
Patient ID: Justin Woodard, male    DOB: 10-26-1955  Age: 60 y.o. MRN: [id number]  CC: Sinusitis and Cough   HPI Justin Woodard presents for CC of sinusitis x 2 weeks.  1) Sinus pressure, head congestions, drainage, HA Maxillary pressure  Not blowing out anything Rhinorrhea worse at night   Treatment to date: Tylenol congestion   Sick contacts: Work mates  History Justin Woodard has a past medical history of DVT (deep venous thrombosis) (HCC); Pulmonary embolism (HCC); A-fib (HCC); GERD (gastroesophageal reflux disease); Hypertension; Anxiety; Symptomatic PVCs; and Erectile dysfunction (05/28/2011).   Justin Woodard has past surgical history that includes Vein Surgery and Tonsillectomy.   His family history includes Heart attack in his father.Justin Woodard reports that Justin Woodard has quit smoking. Justin Woodard does not have any smokeless tobacco history on file. Justin Woodard reports that Justin Woodard does not drink alcohol or use illicit drugs.  Outpatient Prescriptions Prior to Visit  Medication Sig Dispense Refill  . metoprolol succinate (TOPROL-XL) 25 MG 24 hr tablet TAKE 1 TABLET BY MOUTH DAILY 90 tablet 3  . warfarin (COUMADIN) 4 MG tablet Take as directed by anticoagulation clinic (Patient taking differently: Take 6-8 mg by mouth daily. 8 mg QD except Friday, take 6 mg) 180 tablet 1  . lansoprazole (PREVACID SOLUTAB) 15 MG disintegrating tablet Take 1 tablet (15 mg total) by mouth daily at 12 noon. 30 tablet 0  . vardenafil (LEVITRA) 20 MG tablet Take 1 tablet (20 mg total) by mouth as needed for erectile dysfunction. 10 tablet 11   No facility-administered medications prior to visit.    ROS Review of Systems  Constitutional: Positive for chills, diaphoresis and fatigue. Negative for fever.  HENT: Positive for congestion, postnasal drip, rhinorrhea and sinus pressure. Negative for ear pain and sneezing.   Eyes: Negative for visual disturbance.  Respiratory: Positive for cough.   Gastrointestinal: Negative for nausea, vomiting and diarrhea.   Skin: Negative for rash.    Objective:  BP 124/80 mmHg  Pulse 70  Temp(Src) 98.2 F (36.8 C) (Oral)  Ht  (1.905 m)  Wt 284 lb (128.822 kg)  BMI 35.50 kg/m2  SpO2 97%  Physical Exam  Constitutional: Justin Woodard is oriented to person, place, and time. Justin Woodard appears well-developed and well-nourished. No distress.  HENT:  Head: Normocephalic and atraumatic.  Right Ear: External ear normal.  Left Ear: External ear normal.  Mouth/Throat: Oropharynx is clear and moist. No oropharyngeal exudate.  TM's clear bilaterally  Eyes: EOM are normal. Pupils are equal, round, and reactive to light. Right eye exhibits no discharge. Left eye exhibits no discharge. No scleral icterus.  Neck: Normal range of motion. Neck supple.  Cardiovascular: Normal rate, regular rhythm and normal heart sounds.  Exam reveals no gallop and no friction rub.   No murmur heard. Pulmonary/Chest: Effort normal and breath sounds normal. No respiratory distress. Justin Woodard has no wheezes. Justin Woodard has no rales. Justin Woodard exhibits no tenderness.  Lymphadenopathy:    Justin Woodard has no cervical adenopathy.  Neurological: Justin Woodard is alert and oriented to person, place, and time.  Skin: Skin is warm and dry. No rash noted. Justin Woodard is not diaphoretic.  Psychiatric: Justin Woodard has a normal mood and affect. His behavior is normal. Judgment and thought content normal.   Assessment & Plan:   Justin Woodard was seen today for sinusitis and cough.  Diagnoses and all orders for this visit:  Acute sinusitis, recurrence not specified, unspecified location  Other orders -     amoxicillin-clavulanate (AUGMENTIN) 875-125 MG tablet; Take  1 tablet by mouth 2 (two) times daily. -     fluticasone (FLONASE) 50 MCG/ACT nasal spray; Place 2 sprays into both nostrils daily.   I have discontinued Justin Woodard's vardenafil and lansoprazole. I am also having him start on amoxicillin-clavulanate and fluticasone. Additionally, I am having him maintain his metoprolol succinate and warfarin.  Meds ordered  this encounter  Medications  . amoxicillin-clavulanate (AUGMENTIN) 875-125 MG tablet    Sig: Take 1 tablet by mouth 2 (two) times daily.    Dispense:  14 tablet    Refill:  0    Order Specific Question:  Supervising Provider    Answer:  Duncan Dull L [[id number]]  . fluticasone (FLONASE) 50 MCG/ACT nasal spray    Sig: Place 2 sprays into both nostrils daily.    Dispense:  16 g    Refill:  6    Order Specific Question:  Supervising Provider    Answer:  Sherlene Shams [[id number]]     Follow-up: Return if symptoms worsen or fail to improve.

## 2015-06-21 NOTE — Progress Notes (Signed)
Pre visit review using our clinic review tool, if applicable. No additional management support is needed unless otherwise documented below in the visit note. 

## 2015-06-21 NOTE — Assessment & Plan Note (Signed)
New Onset Due to length of symptoms with worsening will treat empirically Flonase was sent to pharmacy  Augmentin was sent to the pharmacy Encouraged Probiotics Continue OTC measures  FU prn worsening/failure to improve.    New onset Will treat conservatively due to probable viral nature Prednisone taper to decrease inflammation given  Instructions for taking done verbally and on AVS

## 2015-07-21 ENCOUNTER — Ambulatory Visit (INDEPENDENT_AMBULATORY_CARE_PROVIDER_SITE_OTHER): Payer: Commercial Managed Care - HMO | Admitting: General Practice

## 2015-07-21 DIAGNOSIS — I4891 Unspecified atrial fibrillation: Secondary | ICD-10-CM | POA: Diagnosis not present

## 2015-07-21 DIAGNOSIS — Z7901 Long term (current) use of anticoagulants: Secondary | ICD-10-CM | POA: Diagnosis not present

## 2015-07-21 DIAGNOSIS — Z5181 Encounter for therapeutic drug level monitoring: Secondary | ICD-10-CM

## 2015-07-21 LAB — POCT INR: INR: 2.7

## 2015-07-21 NOTE — Progress Notes (Signed)
I have reviewed and agree with the plan. 

## 2015-07-21 NOTE — Progress Notes (Signed)
Pre visit review using our clinic review tool, if applicable. No additional management support is needed unless otherwise documented below in the visit note. 

## 2015-08-08 ENCOUNTER — Encounter: Payer: Self-pay | Admitting: Internal Medicine

## 2015-08-08 ENCOUNTER — Ambulatory Visit (INDEPENDENT_AMBULATORY_CARE_PROVIDER_SITE_OTHER): Payer: Commercial Managed Care - HMO | Admitting: Internal Medicine

## 2015-08-08 ENCOUNTER — Other Ambulatory Visit (INDEPENDENT_AMBULATORY_CARE_PROVIDER_SITE_OTHER): Payer: Commercial Managed Care - HMO

## 2015-08-08 VITALS — BP 140/88 | HR 76 | Temp 98.4°F | Resp 20 | Wt 282.0 lb

## 2015-08-08 DIAGNOSIS — R7989 Other specified abnormal findings of blood chemistry: Secondary | ICD-10-CM

## 2015-08-08 DIAGNOSIS — Z0000 Encounter for general adult medical examination without abnormal findings: Secondary | ICD-10-CM

## 2015-08-08 DIAGNOSIS — R739 Hyperglycemia, unspecified: Secondary | ICD-10-CM

## 2015-08-08 DIAGNOSIS — J019 Acute sinusitis, unspecified: Secondary | ICD-10-CM

## 2015-08-08 DIAGNOSIS — I10 Essential (primary) hypertension: Secondary | ICD-10-CM | POA: Diagnosis not present

## 2015-08-08 LAB — URINALYSIS, ROUTINE W REFLEX MICROSCOPIC
Bilirubin Urine: NEGATIVE
HGB URINE DIPSTICK: NEGATIVE
Ketones, ur: NEGATIVE
Leukocytes, UA: NEGATIVE
Nitrite: NEGATIVE
Specific Gravity, Urine: <=1.005 — AB (ref 1.000–1.030)
TOTAL PROTEIN, URINE-UPE24: NEGATIVE
URINE GLUCOSE: NEGATIVE
UROBILINOGEN UA: 0.2 (ref 0.0–1.0)
WBC, UA: NONE SEEN — AB (ref 0–?)
pH: 5.5 (ref 5.0–8.0)

## 2015-08-08 LAB — CBC WITH DIFFERENTIAL/PLATELET
BASOS ABS: 0.1 10*3/uL (ref 0.0–0.1)
Basophils Relative: 1.3 % (ref 0.0–3.0)
EOS ABS: 0.1 10*3/uL (ref 0.0–0.7)
Eosinophils Relative: 2.2 % (ref 0.0–5.0)
HCT: 39.3 % (ref 39.0–52.0)
Hemoglobin: 13.4 g/dL (ref 13.0–17.0)
LYMPHS ABS: 1.5 10*3/uL (ref 0.7–4.0)
Lymphocytes Relative: 27.1 % (ref 12.0–46.0)
MCHC: 34.2 g/dL (ref 30.0–36.0)
MCV: 83.3 fl (ref 78.0–100.0)
MONO ABS: 0.4 10*3/uL (ref 0.1–1.0)
MONOS PCT: 7.7 % (ref 3.0–12.0)
NEUTROS ABS: 3.3 10*3/uL (ref 1.4–7.7)
NEUTROS PCT: 61.7 % (ref 43.0–77.0)
PLATELETS: 248 10*3/uL (ref 150.0–400.0)
RBC: 4.72 Mil/uL (ref 4.22–5.81)
RDW: 13 % (ref 11.5–15.5)
WBC: 5.4 10*3/uL (ref 4.0–10.5)

## 2015-08-08 LAB — HEPATIC FUNCTION PANEL
ALK PHOS: 62 U/L (ref 39–117)
ALT: 24 U/L (ref 0–53)
AST: 17 U/L (ref 0–37)
Albumin: 4.1 g/dL (ref 3.5–5.2)
BILIRUBIN DIRECT: 0.2 mg/dL (ref 0.0–0.3)
TOTAL PROTEIN: 6.7 g/dL (ref 6.0–8.3)
Total Bilirubin: 1.3 mg/dL — ABNORMAL HIGH (ref 0.2–1.2)

## 2015-08-08 LAB — LIPID PANEL
Cholesterol: 196 mg/dL (ref 0–200)
HDL: 32.9 mg/dL — ABNORMAL LOW (ref >39.00)
NONHDL: 162.88
Total CHOL/HDL Ratio: 6
Triglycerides: 234 mg/dL — ABNORMAL HIGH (ref 0.0–149.0)
VLDL: 46.8 mg/dL — ABNORMAL HIGH (ref 0.0–40.0)

## 2015-08-08 LAB — TSH: TSH: 2.24 u[IU]/mL (ref 0.35–4.50)

## 2015-08-08 LAB — BASIC METABOLIC PANEL
BUN: 14 mg/dL (ref 6–23)
CHLORIDE: 105 meq/L (ref 96–112)
CO2: 29 meq/L (ref 19–32)
Calcium: 9.1 mg/dL (ref 8.4–10.5)
Creatinine, Ser: 1.03 mg/dL (ref 0.40–1.50)
GFR: 77.81 mL/min (ref >60.00)
GLUCOSE: 102 mg/dL — AB (ref 70–99)
POTASSIUM: 4.3 meq/L (ref 3.5–5.1)
SODIUM: 139 meq/L (ref 135–145)

## 2015-08-08 LAB — LDL CHOLESTEROL, DIRECT: LDL DIRECT: 98 mg/dL

## 2015-08-08 LAB — HEMOGLOBIN A1C: HEMOGLOBIN A1C: 5.8 % (ref 4.6–6.5)

## 2015-08-08 LAB — PSA: PSA: 1.71 ng/mL (ref 0.10–4.00)

## 2015-08-08 MED ORDER — AZITHROMYCIN 250 MG PO TABS: ORAL_TABLET | ORAL | Status: DC

## 2015-08-08 NOTE — Patient Instructions (Signed)
Please take all new medication as prescribed - the Zpack  Please continue all other medications as before, and refills have been done if requested.  Please have the pharmacy call with any other refills you may need.  Please continue your efforts at being more active, low cholesterol diet, and weight control.  You are otherwise up to date with prevention measures today.  Please keep your appointments with your specialists as you may have planned  Please go to the LAB in the Basement (turn left off the elevator) for the tests to be done today  You will be contacted by phone if any changes need to be made immediately.  Otherwise, you will receive a letter about your results with an explanation, but please check with MyChart first.  Please remember to sign up for MyChart if you have not done so, as this will be important to you in the future with finding out test results, communicating by private email, and scheduling acute appointments online when needed.  Please return in 6 months, or sooner if needed

## 2015-08-08 NOTE — Progress Notes (Signed)
Pre visit review using our clinic review tool, if applicable. No additional management support is needed unless otherwise documented below in the visit note. 

## 2015-08-08 NOTE — Progress Notes (Signed)
Subjective:    Patient ID: Justin Woodard, male    DOB: 27-Jun-1955, 60 y.o.   MRN: [id number]  HPI  Here for wellness and f/u;  Overall doing ok;  Pt denies Chest pain, worsening SOB, DOE, wheezing, orthopnea, PND, worsening LE edema, palpitations, dizziness or syncope.  Pt denies neurological change such as new headache, facial or extremity weakness.  Pt denies polydipsia, polyuria, or low sugar symptoms. Pt states overall good compliance with treatment and medications, good tolerability, and has been trying to follow appropriate diet.  Pt denies worsening depressive symptoms, suicidal ideation or panic. No fever, night sweats, wt loss, loss of appetite, or other constitutional symptoms.  Pt states good ability with ADL's, has low fall risk, home safety reviewed and adequate, no other significant changes in hearing or vision, and only occasionally active with exercise.  Has had recent URI like symptoms, overall 4 wks problems now, not better at one point with augmentin.  Declines cooonoscopy or immunizations.  Did well with zpack last yr. Due for Hep c screen  Has lost significant wt with better diet.  Incidentally  Here with 2-3 days acute onset fever, facial pain, pressure, headache, general weakness and malaise, and greenish d/c, with mild ST and non prod cough  Wt Readings from Last 3 Encounters:  08/08/15 282 lb (127.914 kg)  06/21/15 284 lb (128.822 kg)  04/16/15 290 lb (131.543 kg)   Past Medical History  Diagnosis Date  . DVT (deep venous thrombosis) (HCC)     2000  . Pulmonary embolism (HCC)   . A-fib (HCC)     post PE only  . GERD (gastroesophageal reflux disease)   . Hypertension   . Anxiety   . Symptomatic PVCs   . Erectile dysfunction 05/28/2011   Past Surgical History  Procedure Laterality Date  . Vein surgery      RLE saphenous vein stripping  . Tonsillectomy      reports that he has quit smoking. He does not have any smokeless tobacco history on file. He reports that he  does not drink alcohol or use illicit drugs. family history includes Heart attack in his father. Allergies  Allergen Reactions  . Oxycodone Other (See Comments)    "Just made me feel bad"   Current Outpatient Prescriptions on File Prior to Visit  Medication Sig Dispense Refill  . fluticasone (FLONASE) 50 MCG/ACT nasal spray Place 2 sprays into both nostrils daily. 16 g 6  . metoprolol succinate (TOPROL-XL) 25 MG 24 hr tablet TAKE 1 TABLET BY MOUTH DAILY 90 tablet 3  . warfarin (COUMADIN) 4 MG tablet Take as directed by anticoagulation clinic (Patient taking differently: Take 6-8 mg by mouth daily. 8 mg QD except Friday, take 6 mg) 180 tablet 1   No current facility-administered medications on file prior to visit.   Review of Systems Constitutional: Negative for increased diaphoresis, other activity, appetite or siginficant weight change other than noted HENT: Negative for worsening hearing loss, ear pain, facial swelling, mouth sores and neck stiffness.   Eyes: Negative for other worsening pain, redness or visual disturbance.  Respiratory: Negative for shortness of breath and wheezing  Cardiovascular: Negative for chest pain and palpitations.  Gastrointestinal: Negative for diarrhea, blood in stool, abdominal distention or other pain Genitourinary: Negative for hematuria, flank pain or change in urine volume.  Musculoskeletal: Negative for myalgias or other joint complaints.  Skin: Negative for color change and wound or drainage.  Neurological: Negative for syncope and numbness.  other than noted Hematological: Negative for adenopathy. or other swelling Psychiatric/Behavioral: Negative for hallucinations, SI, self-injury, decreased concentration or other worsening agitation.      Objective:   Physical Exam BP 140/88 mmHg  Pulse 76  Temp(Src) 98.4 F (36.9 C) (Oral)  Resp 20  Wt 282 lb (127.914 kg)  SpO2 96% VS noted, mild ill Constitutional: Pt is oriented to person, place, and  time. Appears well-developed and well-nourished, in no significant distress Head: Normocephalic and atraumatic.  Right Ear: External ear normal.  Left Ear: External ear normal.  Nose: Nose normal.  Bilat tm's with mild erythema.  Max sinus areas mild tender.  Pharynx with mild erythema, no exudate Mouth/Throat: Oropharynx is clear and moist.  Eyes: Conjunctivae and EOM are normal. Pupils are equal, round, and reactive to light.  Neck: Normal range of motion. Neck supple. No JVD present. No tracheal deviation present or significant neck LA or mass Cardiovascular: Normal rate, regular rhythm, normal heart sounds and intact distal pulses.   Pulmonary/Chest: Effort normal and breath sounds without rales or wheezing  Abdominal: Soft. Bowel sounds are normal. NT. No HSM  Musculoskeletal: Normal range of motion. Exhibits no edema.  Lymphadenopathy:  Has no cervical adenopathy.  Neurological: Pt is alert and oriented to person, place, and time. Pt has normal reflexes. No cranial nerve deficit. Motor grossly intact Skin: Skin is warm and dry. No rash noted.  Psychiatric:  Has normal mood and affect. Behavior is normal.     Assessment & Plan:

## 2015-08-12 NOTE — Assessment & Plan Note (Signed)

## 2015-08-12 NOTE — Assessment & Plan Note (Signed)
Mild to mod, for antibx course,  to f/u any worsening symptoms or concerns 

## 2015-08-12 NOTE — Assessment & Plan Note (Signed)
Mild, stable overall by history and exam, recent data reviewed with pt, and pt to continue medical treatment as before,  to f/u any worsening symptoms or concerns Lab Results  Component Value Date   HGBA1C 5.8 08/08/2015

## 2015-08-12 NOTE — Assessment & Plan Note (Signed)
stable overall by history and exam, recent data reviewed with pt, and pt to continue medical treatment as before,  to f/u any worsening symptoms or concerns BP Readings from Last 3 Encounters:  08/08/15 140/88  06/21/15 124/80  04/16/15 120/71

## 2015-08-16 ENCOUNTER — Encounter: Payer: Self-pay | Admitting: Internal Medicine

## 2015-08-16 ENCOUNTER — Ambulatory Visit (INDEPENDENT_AMBULATORY_CARE_PROVIDER_SITE_OTHER): Payer: Commercial Managed Care - HMO | Admitting: Internal Medicine

## 2015-08-16 VITALS — BP 136/80 | HR 69 | Ht 75.0 in | Wt 282.2 lb

## 2015-08-16 DIAGNOSIS — I480 Paroxysmal atrial fibrillation: Secondary | ICD-10-CM

## 2015-08-16 NOTE — Patient Instructions (Signed)

## 2015-08-16 NOTE — Progress Notes (Signed)
HPI [name] returns today after a long absence from our cardiology clinic. He is a very pleasant 60 year old man with a history of pulmonary embolism, and paroxysmal atrial fibrillation after his embolism. He has done well in the interim. He denies syncope. He does have chronic intermittent peripheral edema. He is obese. He notes occasional discomfort in his calves.  He has gained weight.  He has done well with coumadin and does not want to switch. Allergies  Allergen Reactions  . Oxycodone Other (See Comments)    "Just made me feel bad"     Current Outpatient Prescriptions  Medication Sig Dispense Refill  . metoprolol succinate (TOPROL-XL) 25 MG 24 hr tablet TAKE 1 TABLET BY MOUTH DAILY 90 tablet 3  . warfarin (COUMADIN) 4 MG tablet Take 8 mg by mouth daily.     No current facility-administered medications for this visit.     Past Medical History  Diagnosis Date  . DVT (deep venous thrombosis) (HCC)     2000  . Pulmonary embolism (HCC)   . A-fib (HCC)     post PE only  . GERD (gastroesophageal reflux disease)   . Hypertension   . Anxiety   . Symptomatic PVCs   . Erectile dysfunction 05/28/2011    ROS:   All systems reviewed and negative except as noted in the HPI.   Past Surgical History  Procedure Laterality Date  . Vein surgery      RLE saphenous vein stripping  . Tonsillectomy       Family History  Problem Relation Age of Onset  . Diabetes      first degree relatives  . Heart attack Father      Social History   Social History  . Marital Status: Married    Spouse Name: N/A  . Number of Children: 2  . Years of Education: N/A   Occupational History  . 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 Smoking status: Former Games developermoker  . Smokeless tobacco: Never Used  . Alcohol Use: No  . Drug Use: No  . Sexual Activity: Yes   Other Topics Concern  . 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  HEART:  Regular rate rhythm, no murmurs, no rubs, no clicks Abd:  soft, positive bowel sounds, no organomegally, no rebound, no guarding Ext:  2 plus pulses, 1+  edema, no cyanosis, no clubbing Skin:  No rashes no nodules Neuro:  CN II through XII intact, motor grossly intact    Assess/Plan:  1. PAF - he is maintaining NSR. He will continue his dose of beta blocker 2. HTN - he is on metoprolol. His blood pressure has been controlled. 3. Pulmonary embolism - he is asymptomatic, s/p coumadin therapy. Leonia ReevesGregg Mollye Guinta,M.D.

## 2015-08-25 ENCOUNTER — Ambulatory Visit (INDEPENDENT_AMBULATORY_CARE_PROVIDER_SITE_OTHER): Payer: Commercial Managed Care - HMO | Admitting: General Practice

## 2015-08-25 DIAGNOSIS — Z7901 Long term (current) use of anticoagulants: Secondary | ICD-10-CM | POA: Diagnosis not present

## 2015-08-25 DIAGNOSIS — Z5181 Encounter for therapeutic drug level monitoring: Secondary | ICD-10-CM | POA: Diagnosis not present

## 2015-08-25 DIAGNOSIS — I4891 Unspecified atrial fibrillation: Secondary | ICD-10-CM

## 2015-08-25 LAB — POCT INR: INR: 2.5

## 2015-08-25 NOTE — Progress Notes (Signed)
I have reviewed and agree with the plan. 

## 2015-08-25 NOTE — Progress Notes (Signed)
Pre visit review using our clinic review tool, if applicable. No additional management support is needed unless otherwise documented below in the visit note. 

## 2015-08-29 ENCOUNTER — Other Ambulatory Visit: Payer: Self-pay | Admitting: Internal Medicine

## 2015-09-16 ENCOUNTER — Other Ambulatory Visit: Payer: Self-pay | Admitting: Internal Medicine

## 2015-09-18 ENCOUNTER — Other Ambulatory Visit: Payer: Self-pay | Admitting: General Practice

## 2015-09-18 DIAGNOSIS — I480 Paroxysmal atrial fibrillation: Secondary | ICD-10-CM

## 2015-09-18 MED ORDER — WARFARIN SODIUM 4 MG PO TABS: 8.0000 mg | ORAL_TABLET | Freq: Every day | ORAL | Status: DC

## 2015-09-18 NOTE — Telephone Encounter (Signed)
Please advise 

## 2015-10-06 ENCOUNTER — Ambulatory Visit (INDEPENDENT_AMBULATORY_CARE_PROVIDER_SITE_OTHER): Payer: Commercial Managed Care - HMO | Admitting: General Practice

## 2015-10-06 DIAGNOSIS — I4891 Unspecified atrial fibrillation: Secondary | ICD-10-CM | POA: Diagnosis not present

## 2015-10-06 DIAGNOSIS — Z7901 Long term (current) use of anticoagulants: Secondary | ICD-10-CM

## 2015-10-06 DIAGNOSIS — Z5181 Encounter for therapeutic drug level monitoring: Secondary | ICD-10-CM | POA: Diagnosis not present

## 2015-10-06 LAB — POCT INR: INR: 2.2

## 2015-10-06 NOTE — Progress Notes (Signed)
I have reviewed and agree with the plan. 

## 2015-10-06 NOTE — Progress Notes (Signed)
Pre visit review using our clinic review tool, if applicable. No additional management support is needed unless otherwise documented below in the visit note. 

## 2015-11-15 ENCOUNTER — Encounter: Payer: Self-pay | Admitting: Internal Medicine

## 2015-11-16 NOTE — Telephone Encounter (Signed)
Ms Letitia LibraJohnston  Not sure if this is able or appropriate to be done  thanks

## 2015-11-17 ENCOUNTER — Ambulatory Visit (INDEPENDENT_AMBULATORY_CARE_PROVIDER_SITE_OTHER): Payer: Commercial Managed Care - HMO | Admitting: General Practice

## 2015-11-17 DIAGNOSIS — I4891 Unspecified atrial fibrillation: Secondary | ICD-10-CM

## 2015-11-17 DIAGNOSIS — Z7901 Long term (current) use of anticoagulants: Secondary | ICD-10-CM | POA: Diagnosis not present

## 2015-11-17 DIAGNOSIS — Z5181 Encounter for therapeutic drug level monitoring: Secondary | ICD-10-CM | POA: Diagnosis not present

## 2015-11-17 LAB — POCT INR: INR: 2.3

## 2015-11-17 NOTE — Progress Notes (Signed)
I have reviewed and agree with the plan. 

## 2015-11-17 NOTE — Progress Notes (Signed)
Pre visit review using our clinic review tool, if applicable. No additional management support is needed unless otherwise documented below in the visit note. 

## 2015-11-29 ENCOUNTER — Other Ambulatory Visit: Payer: Self-pay | Admitting: Internal Medicine

## 2015-12-29 ENCOUNTER — Ambulatory Visit (INDEPENDENT_AMBULATORY_CARE_PROVIDER_SITE_OTHER): Payer: Commercial Managed Care - HMO | Admitting: General Practice

## 2015-12-29 DIAGNOSIS — Z5181 Encounter for therapeutic drug level monitoring: Secondary | ICD-10-CM | POA: Diagnosis not present

## 2015-12-29 LAB — POCT INR: INR: 2.6

## 2015-12-29 NOTE — Progress Notes (Signed)
I have reviewed and agree with the plan. 

## 2016-02-09 ENCOUNTER — Ambulatory Visit (INDEPENDENT_AMBULATORY_CARE_PROVIDER_SITE_OTHER): Payer: Commercial Managed Care - HMO | Admitting: General Practice

## 2016-02-09 DIAGNOSIS — Z5181 Encounter for therapeutic drug level monitoring: Secondary | ICD-10-CM

## 2016-02-09 DIAGNOSIS — Z7901 Long term (current) use of anticoagulants: Secondary | ICD-10-CM | POA: Diagnosis not present

## 2016-02-09 DIAGNOSIS — I4891 Unspecified atrial fibrillation: Secondary | ICD-10-CM | POA: Diagnosis not present

## 2016-02-09 LAB — POCT INR: INR: 2.3

## 2016-02-09 NOTE — Progress Notes (Signed)
I have reviewed and agree with the plan. 

## 2016-02-13 NOTE — Progress Notes (Signed)
I have reviewed and agree with the plan. 

## 2016-02-21 ENCOUNTER — Encounter: Payer: Self-pay | Admitting: Internal Medicine

## 2016-02-22 ENCOUNTER — Ambulatory Visit: Payer: Commercial Managed Care - HMO | Admitting: Internal Medicine

## 2016-03-18 ENCOUNTER — Other Ambulatory Visit: Payer: Self-pay | Admitting: General Practice

## 2016-03-18 ENCOUNTER — Other Ambulatory Visit: Payer: Self-pay | Admitting: Internal Medicine

## 2016-04-19 ENCOUNTER — Ambulatory Visit (INDEPENDENT_AMBULATORY_CARE_PROVIDER_SITE_OTHER): Payer: Commercial Managed Care - HMO | Admitting: General Practice

## 2016-04-19 DIAGNOSIS — Z7901 Long term (current) use of anticoagulants: Secondary | ICD-10-CM | POA: Diagnosis not present

## 2016-04-19 DIAGNOSIS — Z5181 Encounter for therapeutic drug level monitoring: Secondary | ICD-10-CM

## 2016-04-19 DIAGNOSIS — I4891 Unspecified atrial fibrillation: Secondary | ICD-10-CM

## 2016-04-19 LAB — POCT INR: INR: 2.3

## 2016-04-19 NOTE — Patient Instructions (Signed)
Pre visit review using our clinic review tool, if applicable. No additional management support is needed unless otherwise documented below in the visit note. 

## 2016-04-19 NOTE — Progress Notes (Signed)
I have reviewed and agree with the plan. 

## 2016-05-01 ENCOUNTER — Encounter: Payer: Self-pay | Admitting: Internal Medicine

## 2016-05-01 ENCOUNTER — Ambulatory Visit (INDEPENDENT_AMBULATORY_CARE_PROVIDER_SITE_OTHER): Payer: Commercial Managed Care - HMO | Admitting: Internal Medicine

## 2016-05-01 VITALS — BP 136/76 | HR 66 | Temp 98.1°F | Resp 20 | Wt 298.0 lb

## 2016-05-01 DIAGNOSIS — I10 Essential (primary) hypertension: Secondary | ICD-10-CM

## 2016-05-01 DIAGNOSIS — R739 Hyperglycemia, unspecified: Secondary | ICD-10-CM

## 2016-05-01 DIAGNOSIS — Z0001 Encounter for general adult medical examination with abnormal findings: Secondary | ICD-10-CM

## 2016-05-01 DIAGNOSIS — J019 Acute sinusitis, unspecified: Secondary | ICD-10-CM | POA: Diagnosis not present

## 2016-05-01 DIAGNOSIS — F411 Generalized anxiety disorder: Secondary | ICD-10-CM

## 2016-05-01 MED ORDER — HYDROCODONE-HOMATROPINE 5-1.5 MG/5ML PO SYRP: 5.0000 mL | ORAL_SOLUTION | Freq: Four times a day (QID) | ORAL | 0 refills | Status: AC | PRN

## 2016-05-01 MED ORDER — AZITHROMYCIN 250 MG PO TABS: ORAL_TABLET | ORAL | 1 refills | Status: DC

## 2016-05-01 NOTE — Progress Notes (Signed)
   Subjective:    Patient ID: Justin Woodard, male    DOB: 12/17/1955, 60 y.o.   MRN: [id number]  HPI   Here with 2-3 days acute onset fever, facial pain, pressure, headache, general weakness and malaise, and greenish d/c, with mild ST and cough, but pt denies chest pain, wheezing, increased sob or doe, orthopnea, PND, increased LE swelling, palpitations, dizziness or syncope.  Pt denies new neurological symptoms such as new headache, or facial or extremity weakness or numbness   Pt denies polydipsia, polyuria, Denies worsening depressive symptoms, suicidal ideation, or panic; has ongoing anxiety, o/w stable. No other new history Past Medical History:  Diagnosis Date  . A-fib (HCC)    post PE only  . Anxiety   . DVT (deep venous thrombosis) (HCC)    2000  . Erectile dysfunction 05/28/2011  . GERD (gastroesophageal reflux disease)   . Hypertension   . Pulmonary embolism (HCC)   . Symptomatic PVCs    Past Surgical History:  Procedure Laterality Date  . TONSILLECTOMY    . VEIN SURGERY     RLE saphenous vein stripping    reports that he has quit smoking. He has never used smokeless tobacco. He reports that he does not drink alcohol or use drugs. family history includes Heart attack in his father. Allergies  Allergen Reactions  . Oxycodone Other (See Comments)    "Just made me feel bad"   Current Outpatient Prescriptions on File Prior to Visit  Medication Sig Dispense Refill  . metoprolol succinate (TOPROL-XL) 25 MG 24 hr tablet TAKE 1 TABLET BY MOUTH DAILY 90 tablet 0  . metoprolol succinate (TOPROL-XL) 25 MG 24 hr tablet TAKE 1 TABLET(25 MG) BY MOUTH DAILY 90 tablet 0  . warfarin (COUMADIN) 4 MG tablet TAKE AS DIRECTED BY ANTICOAGULATION CLINIC 180 tablet 1   No current facility-administered medications on file prior to visit.     Review of Systems  Constitutional: Negative for unusual diaphoresis or night sweats HENT: Negative for ear swelling or discharge Eyes: Negative for  worsening visual haziness  Respiratory: Negative for choking and stridor.   Gastrointestinal: Negative for distension or worsening eructation Genitourinary: Negative for retention or change in urine volume.  Musculoskeletal: Negative for other MSK pain or swelling Skin: Negative for color change and worsening wound Neurological: Negative for tremors and numbness other than noted  Psychiatric/Behavioral: Negative for decreased concentration or agitation other than above   All other system neg per pt    Objective:   Physical Exam BP 136/76   Pulse 66   Temp 98.1 F (36.7 C) (Oral)   Resp 20   Wt 298 lb (135.2 kg)   SpO2 98%   BMI 37.25 kg/m  VS noted,  Constitutional: Pt appears in no apparent distress HENT: Head: NCAT.  Right Ear: External ear normal.  Left Ear: External ear normal.  Eyes: . Pupils are equal, round, and reactive to light. Conjunctivae and EOM are normal Bilat tm's with mild erythema.  Max sinus areas mild tender.  Pharynx with mild erythema, no exudate Neck: Normal range of motion. Neck supple.  Cardiovascular: Normal rate and regular rhythm.   Pulmonary/Chest: Effort normal and breath sounds decreased without rales or wheezing.  Neurological: Pt is alert. Not confused , motor grossly intact Skin: Skin is warm. No rash, no LE edema Psychiatric: Pt behavior is normal. No agitation. mild nervous, not depressed affect No other new exam findings    Assessment & Plan:

## 2016-05-01 NOTE — Patient Instructions (Addendum)
Please take all new medication as prescribed - the antibiotic, and the cough medicine if needed  Please continue all other medications as before, and refills have been done if requested.  Please have the pharmacy call with any other refills you may need.  Please keep your appointments with your specialists as you may have planned  Please return in 3 months, or sooner if needed, with Lab testing done 3-5 days before

## 2016-05-01 NOTE — Progress Notes (Signed)
Pre visit review using our clinic review tool, if applicable. No additional management support is needed unless otherwise documented below in the visit note. 

## 2016-05-05 NOTE — Assessment & Plan Note (Signed)
stable overall by history and exam, recent data reviewed with pt, and pt to continue medical treatment as before,  to f/u any worsening symptoms or concerns BP Readings from Last 3 Encounters:  05/01/16 136/76  08/16/15 136/80  08/08/15 140/88

## 2016-05-05 NOTE — Assessment & Plan Note (Signed)
Stable, declines need for further tx or referral for counseling

## 2016-05-05 NOTE — Assessment & Plan Note (Signed)
stable overall by history and exam, recent data reviewed with pt, and pt to continue medical treatment as before,  to f/u any worsening symptoms or concerns Lab Results  Component Value Date   HGBA1C 5.8 08/08/2015

## 2016-05-05 NOTE — Assessment & Plan Note (Signed)
Mild to mod, for antibx course,  to f/u any worsening symptoms or concerns 

## 2016-05-22 ENCOUNTER — Ambulatory Visit (INDEPENDENT_AMBULATORY_CARE_PROVIDER_SITE_OTHER): Payer: Commercial Managed Care - HMO | Admitting: Physician Assistant

## 2016-05-22 VITALS — BP 128/88 | HR 69 | Temp 98.4°F | Resp 16 | Ht 73.0 in | Wt 298.0 lb

## 2016-05-22 DIAGNOSIS — R519 Headache, unspecified: Secondary | ICD-10-CM

## 2016-05-22 DIAGNOSIS — R51 Headache: Secondary | ICD-10-CM | POA: Diagnosis not present

## 2016-05-22 LAB — POCT SEDIMENTATION RATE: POCT SED RATE: 26.5 mm/hr — AB (ref 0–22)

## 2016-05-22 NOTE — Progress Notes (Signed)
Justin Woodard  MRN: [id number] DOB: September 09, 1955  PCP: Oliver Barre, MD  Subjective:  Pt is a 61 year old male PMH Atrial fibrillation, PE and HTN who presents to clinic for pain on side of head, at his temple. Pain started three days ago. Constant. Hurts worse with applying pressure. Denies MOI. He has not taken anything for pain. Nothing makes it better. Denies pulsatile feeling, visual disturbance, jaw pain, fever, chills, laceration, drainage, bleeding, claudication.  Review of Systems  Constitutional: Negative for chills and diaphoresis.  Respiratory: Negative for cough, chest tightness, shortness of breath and wheezing.   Cardiovascular: Negative for chest pain, palpitations and leg swelling.  Gastrointestinal: Negative for diarrhea, nausea and vomiting.  Musculoskeletal: Negative for neck pain.  Neurological: Positive for headaches. Negative for dizziness, syncope and light-headedness.  Psychiatric/Behavioral: Negative for sleep disturbance. The patient is not nervous/anxious.     Patient Active Problem List   Diagnosis Date Noted  . Hyperglycemia 08/08/2015  . Acute sinus infection 01/18/2015  . Encounter for therapeutic drug monitoring 06/22/2013  . Shingles 02/06/2012  . Erectile dysfunction 05/28/2011  . Preventative health care 05/25/2011  . Long term current use of anticoagulant 07/03/2010  . Anxiety state 04/20/2007  . Essential hypertension 04/20/2007  . PE 04/20/2007  . ATRIAL FIBRILLATION 04/20/2007  . PREMATURE VENTRICULAR CONTRACTIONS 04/20/2007  . GERD 04/20/2007  . CHEST PAIN 04/20/2007  . Personal history of venous thrombosis and embolism 04/20/2007  . DEEP VENOUS THROMBOPHLEBITIS, HX OF 04/20/2007    Current Outpatient Prescriptions on File Prior to Visit  Medication Sig Dispense Refill  . metoprolol succinate (TOPROL-XL) 25 MG 24 hr tablet TAKE 1 TABLET BY MOUTH DAILY 90 tablet 0  . metoprolol succinate (TOPROL-XL) 25 MG 24 hr tablet TAKE 1 TABLET(25  MG) BY MOUTH DAILY 90 tablet 0  . warfarin (COUMADIN) 4 MG tablet TAKE AS DIRECTED BY ANTICOAGULATION CLINIC 180 tablet 1   No current facility-administered medications on file prior to visit.     Allergies  Allergen Reactions  . Oxycodone Other (See Comments)    "Just made me feel bad"     Objective:  BP 128/88 (BP Location: Right Arm, Patient Position: Sitting, Cuff Size: Large)   Pulse 69   Temp 98.4 F (36.9 C) (Oral)   Resp 16   Ht 6\' 1"  (1.854 m)   Wt 298 lb (135.2 kg)   SpO2 97%   BMI 39.32 kg/m   Physical Exam  Constitutional: He is oriented to person, place, and time and well-developed, well-nourished, and in no distress. No distress.  HENT:  Head:    Cardiovascular: Normal rate, regular rhythm and normal heart sounds.   Neurological: He is alert and oriented to person, place, and time. GCS score is 15.  Skin: Skin is warm and dry.  Psychiatric: Mood, memory, affect and judgment normal.  Vitals reviewed.  Results for orders placed or performed in visit on 05/22/16  POCT SEDIMENTATION RATE  Result Value Ref Range   POCT SED RATE 26.5 (A) 0 - 22 mm/hr    Assessment and Plan :  1. Temple tenderness 2. Acute nonintractable headache, unspecified headache type - C-reactive protein - POCT SEDIMENTATION RATE - Labs pending, r/o giant cell arteritis. Will contact with results. Advised patient to treat headache with Tylenol and supportive care at this time. Discussed symptoms that should lead patient to emergency room. He understands and agrees with plan.   Marco Collie, PA-C  [facility] 05/22/2016 12:14 PM

## 2016-05-22 NOTE — Patient Instructions (Addendum)
I will contact you as soon as your lab work comes back. In the meantime, take Tylenol for pain.  Return to the clinic if you experience increasing headache, jaw pain, changes in your vision, fever, chills.   Thank you for coming in today. I hope you feel we met your needs.  Feel free to call [facility] if you have any questions or further requests.  Please consider signing up for MyChart if you do not already have it, as this is a great way to communicate with me.  Best,  Whitney McVey, PA-C   IF you received an x-ray today, you will receive an invoice from [facility]. Please contact [facility] at [phone] with questions or concerns regarding your invoice.   IF you received labwork today, you will receive an invoice from [facility]. Please contact [facility] at [phone] with questions or concerns regarding your invoice.   Our billing staff will not be able to assist you with questions regarding bills from these companies.  You will be contacted with the lab results as soon as they are available. The fastest way to get your results is to activate your My Chart account. Instructions are located on the last page of this paperwork. If you have not heard from [place] regarding the results in 2 weeks, please contact this office.

## 2016-05-23 ENCOUNTER — Encounter: Payer: Self-pay | Admitting: Internal Medicine

## 2016-05-23 LAB — C-REACTIVE PROTEIN: CRP: 15.5 mg/L — ABNORMAL HIGH (ref 0.0–4.9)

## 2016-05-24 ENCOUNTER — Other Ambulatory Visit: Payer: Self-pay | Admitting: Internal Medicine

## 2016-05-24 ENCOUNTER — Ambulatory Visit (INDEPENDENT_AMBULATORY_CARE_PROVIDER_SITE_OTHER): Payer: Commercial Managed Care - HMO | Admitting: General Practice

## 2016-05-24 DIAGNOSIS — Z5181 Encounter for therapeutic drug level monitoring: Secondary | ICD-10-CM

## 2016-05-24 DIAGNOSIS — Z7901 Long term (current) use of anticoagulants: Secondary | ICD-10-CM

## 2016-05-24 DIAGNOSIS — I4891 Unspecified atrial fibrillation: Secondary | ICD-10-CM

## 2016-05-24 LAB — POCT INR: INR: 2.5

## 2016-05-24 MED ORDER — AZITHROMYCIN 250 MG PO TABS: ORAL_TABLET | ORAL | 1 refills | Status: DC

## 2016-05-24 NOTE — Progress Notes (Signed)
Spoke to pt on phone. His TTP at [place] has all but resolved. He now feels like he is "coming down with a sinus infection". He is doing supportive care. RTC if symptoms worsen. Very low likelihood for GCA.

## 2016-05-24 NOTE — Patient Instructions (Signed)
Pre visit review using our clinic review tool, if applicable. No additional management support is needed unless otherwise documented below in the visit note. 

## 2016-05-25 ENCOUNTER — Other Ambulatory Visit: Payer: Self-pay | Admitting: Nurse Practitioner

## 2016-05-28 ENCOUNTER — Other Ambulatory Visit: Payer: Self-pay | Admitting: General Practice

## 2016-05-28 MED ORDER — METOPROLOL SUCCINATE ER 25 MG PO TB24: ORAL_TABLET | ORAL | 0 refills | Status: DC

## 2016-07-05 ENCOUNTER — Ambulatory Visit (INDEPENDENT_AMBULATORY_CARE_PROVIDER_SITE_OTHER): Payer: Commercial Managed Care - HMO | Admitting: General Practice

## 2016-07-05 ENCOUNTER — Ambulatory Visit: Payer: Commercial Managed Care - HMO

## 2016-07-05 DIAGNOSIS — Z7901 Long term (current) use of anticoagulants: Secondary | ICD-10-CM

## 2016-07-05 DIAGNOSIS — Z5181 Encounter for therapeutic drug level monitoring: Secondary | ICD-10-CM

## 2016-07-05 DIAGNOSIS — I4891 Unspecified atrial fibrillation: Secondary | ICD-10-CM

## 2016-07-05 LAB — POCT INR: INR: 2.4

## 2016-07-05 NOTE — Patient Instructions (Signed)
Pre visit review using our clinic review tool, if applicable. No additional management support is needed unless otherwise documented below in the visit note. 

## 2016-07-05 NOTE — Progress Notes (Signed)
I have reviewed and agree with the plan. 

## 2016-08-16 ENCOUNTER — Ambulatory Visit: Payer: Commercial Managed Care - HMO

## 2016-08-23 ENCOUNTER — Ambulatory Visit (INDEPENDENT_AMBULATORY_CARE_PROVIDER_SITE_OTHER): Payer: Commercial Managed Care - HMO | Admitting: General Practice

## 2016-08-23 DIAGNOSIS — Z7901 Long term (current) use of anticoagulants: Secondary | ICD-10-CM

## 2016-08-23 DIAGNOSIS — Z5181 Encounter for therapeutic drug level monitoring: Secondary | ICD-10-CM

## 2016-08-23 DIAGNOSIS — I4891 Unspecified atrial fibrillation: Secondary | ICD-10-CM

## 2016-08-23 LAB — POCT INR: INR: 2.3

## 2016-08-23 NOTE — Patient Instructions (Signed)
Pre visit review using our clinic review tool, if applicable. No additional management support is needed unless otherwise documented below in the visit note. 

## 2016-08-23 NOTE — Progress Notes (Signed)
I have reviewed and agree with the plan. 

## 2016-09-13 ENCOUNTER — Encounter: Payer: Self-pay | Admitting: Internal Medicine

## 2016-09-13 ENCOUNTER — Other Ambulatory Visit (INDEPENDENT_AMBULATORY_CARE_PROVIDER_SITE_OTHER): Payer: Commercial Managed Care - HMO

## 2016-09-13 ENCOUNTER — Ambulatory Visit (INDEPENDENT_AMBULATORY_CARE_PROVIDER_SITE_OTHER): Payer: Commercial Managed Care - HMO | Admitting: Internal Medicine

## 2016-09-13 VITALS — BP 138/82 | HR 70 | Ht 75.0 in | Wt 295.0 lb

## 2016-09-13 DIAGNOSIS — Z0000 Encounter for general adult medical examination without abnormal findings: Secondary | ICD-10-CM | POA: Diagnosis not present

## 2016-09-13 DIAGNOSIS — R1310 Dysphagia, unspecified: Secondary | ICD-10-CM | POA: Insufficient documentation

## 2016-09-13 DIAGNOSIS — R739 Hyperglycemia, unspecified: Secondary | ICD-10-CM

## 2016-09-13 DIAGNOSIS — I10 Essential (primary) hypertension: Secondary | ICD-10-CM | POA: Diagnosis not present

## 2016-09-13 DIAGNOSIS — M79601 Pain in right arm: Secondary | ICD-10-CM | POA: Diagnosis not present

## 2016-09-13 DIAGNOSIS — M5412 Radiculopathy, cervical region: Secondary | ICD-10-CM | POA: Insufficient documentation

## 2016-09-13 DIAGNOSIS — R7989 Other specified abnormal findings of blood chemistry: Secondary | ICD-10-CM

## 2016-09-13 HISTORY — DX: Dysphagia, unspecified: R13.10

## 2016-09-13 HISTORY — DX: Radiculopathy, cervical region: M54.12

## 2016-09-13 LAB — URINALYSIS, ROUTINE W REFLEX MICROSCOPIC
Bilirubin Urine: NEGATIVE
Hgb urine dipstick: NEGATIVE
Ketones, ur: NEGATIVE
Leukocytes, UA: NEGATIVE
NITRITE: NEGATIVE
RBC / HPF: NONE SEEN (ref 0–?)
Specific Gravity, Urine: 1.01 (ref 1.000–1.030)
Total Protein, Urine: NEGATIVE
URINE GLUCOSE: NEGATIVE
Urobilinogen, UA: 0.2 (ref 0.0–1.0)
WBC UA: NONE SEEN (ref 0–?)
pH: 6.5 (ref 5.0–8.0)

## 2016-09-13 LAB — HEPATIC FUNCTION PANEL
ALT: 20 U/L (ref 0–53)
AST: 17 U/L (ref 0–37)
Albumin: 4.1 g/dL (ref 3.5–5.2)
Alkaline Phosphatase: 64 U/L (ref 39–117)
BILIRUBIN DIRECT: 0.2 mg/dL (ref 0.0–0.3)
BILIRUBIN TOTAL: 1.4 mg/dL — AB (ref 0.2–1.2)
Total Protein: 6.8 g/dL (ref 6.0–8.3)

## 2016-09-13 LAB — BASIC METABOLIC PANEL
BUN: 13 mg/dL (ref 6–23)
CHLORIDE: 103 meq/L (ref 96–112)
CO2: 30 mEq/L (ref 19–32)
CREATININE: 1.04 mg/dL (ref 0.40–1.50)
Calcium: 9 mg/dL (ref 8.4–10.5)
GFR: 76.67 mL/min (ref >60.00)
Glucose, Bld: 96 mg/dL (ref 70–99)
POTASSIUM: 4.1 meq/L (ref 3.5–5.1)
Sodium: 139 mEq/L (ref 135–145)

## 2016-09-13 LAB — CBC WITH DIFFERENTIAL/PLATELET
BASOS PCT: 1.3 % (ref 0.0–3.0)
Basophils Absolute: 0.1 10*3/uL (ref 0.0–0.1)
EOS ABS: 0.1 10*3/uL (ref 0.0–0.7)
EOS PCT: 3 % (ref 0.0–5.0)
HCT: 39.5 % (ref 39.0–52.0)
Hemoglobin: 13.7 g/dL (ref 13.0–17.0)
Lymphocytes Relative: 36.7 % (ref 12.0–46.0)
Lymphs Abs: 1.8 10*3/uL (ref 0.7–4.0)
MCHC: 34.6 g/dL (ref 30.0–36.0)
MCV: 85.3 fl (ref 78.0–100.0)
Monocytes Absolute: 0.3 10*3/uL (ref 0.1–1.0)
Monocytes Relative: 7.1 % (ref 3.0–12.0)
NEUTROS ABS: 2.6 10*3/uL (ref 1.4–7.7)
Neutrophils Relative %: 51.9 % (ref 43.0–77.0)
PLATELETS: 210 10*3/uL (ref 150.0–400.0)
RBC: 4.63 Mil/uL (ref 4.22–5.81)
RDW: 13.1 % (ref 11.5–15.5)
WBC: 4.9 10*3/uL (ref 4.0–10.5)

## 2016-09-13 LAB — LIPID PANEL
CHOL/HDL RATIO: 5
Cholesterol: 182 mg/dL (ref 0–200)
HDL: 40.3 mg/dL (ref >39.00)
NONHDL: 142.08
Triglycerides: 258 mg/dL — ABNORMAL HIGH (ref 0.0–149.0)
VLDL: 51.6 mg/dL — ABNORMAL HIGH (ref 0.0–40.0)

## 2016-09-13 LAB — HEMOGLOBIN A1C: Hgb A1c MFr Bld: 5.8 % (ref 4.6–6.5)

## 2016-09-13 LAB — PSA: PSA: 1.91 ng/mL (ref 0.10–4.00)

## 2016-09-13 LAB — LDL CHOLESTEROL, DIRECT: Direct LDL: 85 mg/dL

## 2016-09-13 LAB — TSH: TSH: 2.63 u[IU]/mL (ref 0.35–4.50)

## 2016-09-13 MED ORDER — METOPROLOL SUCCINATE ER 25 MG PO TB24: ORAL_TABLET | ORAL | 3 refills | Status: DC

## 2016-09-13 NOTE — Progress Notes (Signed)
Pre visit review using our clinic review tool, if applicable. No additional management support is needed unless otherwise documented below in the visit note. 

## 2016-09-13 NOTE — Progress Notes (Signed)
Subjective:    Patient ID: Justin Woodard, male    DOB: 01-Apr-1956, 61 y.o.   MRN: [id number]  HPI  Here for wellness and f/u;  Overall doing ok;  Pt denies Chest pain, worsening SOB, DOE, wheezing, orthopnea, PND, worsening LE edema, palpitations, dizziness or syncope.  Pt denies neurological change such as new headache, facial or extremity weakness.  Pt denies polydipsia, polyuria, or low sugar symptoms. Pt states overall good compliance with treatment and medications, good tolerability, and has been trying to follow appropriate diet.  Pt denies worsening depressive symptoms, suicidal ideation or panic. No fever, night sweats, wt loss, loss of appetite, or other constitutional symptoms.  Pt states good ability with ADL's, has low fall risk, home safety reviewed and adequate, no other significant changes in hearing or vision, and only occasionally active with exercise. Declines colonosocpy and immunizations.   Wt Readings from Last 3 Encounters:  09/13/16 295 lb (133.8 kg)  05/22/16 298 lb (135.2 kg)  05/01/16 298 lb (135.2 kg)  Working too hard, [place], feels fatigued but denies worsening daytime somnolence.  Denies worsening reflux, abd pain, n/v, bowel change or blood, but has had intermittent dysphagia in the neck area for 1 month.  Also has right ulnar neuritic type pain recurrent with working more with hands at work, better to rest for several wks, overall mild, declines need for further evaluation for now.  Does also have occasional night leg cramps. Past Medical History:  Diagnosis Date  . A-fib (HCC)    post PE only  . Anxiety   . DVT (deep venous thrombosis) (HCC)    2000  . Erectile dysfunction 05/28/2011  . GERD (gastroesophageal reflux disease)   . Hypertension   . Pulmonary embolism (HCC)   . Symptomatic PVCs    Past Surgical History:  Procedure Laterality Date  . TONSILLECTOMY    . VEIN SURGERY     RLE saphenous vein stripping    reports that he has quit smoking. He  has never used smokeless tobacco. He reports that he does not drink alcohol or use drugs. family history includes Heart attack in his father. Allergies  Allergen Reactions  . Oxycodone Other (See Comments)    "Just made me feel bad"   Current Outpatient Prescriptions on File Prior to Visit  Medication Sig Dispense Refill  . warfarin (COUMADIN) 4 MG tablet TAKE AS DIRECTED BY ANTICOAGULATION CLINIC 180 tablet 1   No current facility-administered medications on file prior to visit.    Review of Systems Constitutional: Negative for other unusual diaphoresis, sweats, appetite or weight changes HENT: Negative for other worsening hearing loss, ear pain, facial swelling, mouth sores or neck stiffness.   Eyes: Negative for other worsening pain, redness or other visual disturbance.  Respiratory: Negative for other stridor or swelling Cardiovascular: Negative for other palpitations or other chest pain  Gastrointestinal: Negative for worsening diarrhea or loose stools, blood in stool, distention or other pain Genitourinary: Negative for hematuria, flank pain or other change in urine volume.  Musculoskeletal: Negative for myalgias or other joint swelling.  Skin: Negative for other color change, or other wound or worsening drainage.  Neurological: Negative for other syncope or numbness. Hematological: Negative for other adenopathy or swelling Psychiatric/Behavioral: Negative for hallucinations, other worsening agitation, SI, self-injury, or new decreased concentration All other system eng per pt    Objective:   Physical Exam BP 138/82   Pulse 70   Ht  (1.905 m)  Wt 295 lb (133.8 kg)   SpO2 98%   BMI 36.87 kg/m  VS noted, obese Constitutional: Pt is oriented to person, place, and time. Appears well-developed and well-nourished, in no significant distress and comfortable Head: Normocephalic and atraumatic  Eyes: Conjunctivae and EOM are normal. Pupils are equal, round, and reactive to  light Right Ear: External ear normal without discharge Left Ear: External ear normal without discharge Nose: Nose without discharge or deformity Mouth/Throat: Oropharynx is without other ulcerations and moist  Neck: Normal range of motion. Neck supple. No JVD present. No tracheal deviation present or significant neck LA or mass Cardiovascular: Normal rate, regular rhythm, normal heart sounds and intact distal pulses.   Pulmonary/Chest: WOB normal and breath sounds without rales or wheezing  Abdominal: Soft. Bowel sounds are normal. NT. No HSM  Musculoskeletal: Normal range of motion. Exhibits no edema Lymphadenopathy: Has no other cervical adenopathy.  Neurological: Pt is alert and oriented to person, place, and time. Pt has normal reflexes. No cranial nerve deficit. Motor grossly intact, Gait intact Skin: Skin is warm and dry. No rash noted or new ulcerations Psychiatric:  Has normal mood and affect. Behavior is normal without agitation No other new exam findings    Assessment & Plan:

## 2016-09-13 NOTE — Patient Instructions (Signed)
Please continue all other medications as before, and refills have been done if requested.  Please have the pharmacy call with any other refills you may need.  Please continue your efforts at being more active, low cholesterol diet, and weight control.  You are otherwise up to date with prevention measures today.  Please keep your appointments with your specialists as you may have planned  You will be contacted regarding the referral for: Gastroenterology  Please call if you change your mind about the referral to Neurosurgury for the arm, or the muscle relaxer  Please go to the LAB in the Basement (turn left off the elevator) for the tests to be done today  You will be contacted by phone if any changes need to be made immediately.  Otherwise, you will receive a letter about your results with an explanation, but please check with MyChart first.  Please remember to sign up for MyChart if you have not done so, as this will be important to you in the future with finding out test results, communicating by private email, and scheduling acute appointments online when needed.  Please return in 1 year for your yearly visit, or sooner if needed, with Lab testing done 3-5 days before

## 2016-09-14 NOTE — Assessment & Plan Note (Signed)
Mild,  Lab Results  Component Value Date   HGBA1C 5.8 09/13/2016  to cont wt loss efforts, diet, excercise

## 2016-09-14 NOTE — Assessment & Plan Note (Signed)
stable overall by history and exam, recent data reviewed with pt, and pt to continue medical treatment as before,  to f/u any worsening symptoms or concerns BP Readings from Last 3 Encounters:  09/13/16 138/82  05/22/16 128/88  05/01/16 136/76

## 2016-09-14 NOTE — Assessment & Plan Note (Signed)
c/w ulnar neuritic type pain though has had neck pain in the past as well, overall quite mild, work related and declines hand surgury referral for now

## 2016-09-14 NOTE — Assessment & Plan Note (Signed)

## 2016-09-14 NOTE — Assessment & Plan Note (Signed)
Etiology unclear, pt will need referral to GI, may need endoscopy.  He is due for colonoscopy but has been putting off as he is wary of interrupting his chronic anticoagulation

## 2016-09-23 ENCOUNTER — Telehealth: Payer: Self-pay | Admitting: Internal Medicine

## 2016-09-23 ENCOUNTER — Encounter: Payer: Self-pay | Admitting: Internal Medicine

## 2016-09-23 NOTE — Telephone Encounter (Signed)
Pt needs a refill on his warfarin (COUMADIN) 4 MG tablet [facility] [address].

## 2016-09-24 MED ORDER — WARFARIN SODIUM 4 MG PO TABS: ORAL_TABLET | ORAL | 1 refills | Status: DC

## 2016-10-04 ENCOUNTER — Ambulatory Visit (INDEPENDENT_AMBULATORY_CARE_PROVIDER_SITE_OTHER): Payer: Commercial Managed Care - HMO | Admitting: General Practice

## 2016-10-04 DIAGNOSIS — Z5181 Encounter for therapeutic drug level monitoring: Secondary | ICD-10-CM | POA: Diagnosis not present

## 2016-10-04 DIAGNOSIS — I4891 Unspecified atrial fibrillation: Secondary | ICD-10-CM

## 2016-10-04 LAB — POCT INR: INR: 2.6

## 2016-10-04 NOTE — Patient Instructions (Signed)
Pre visit review using our clinic review tool, if applicable. No additional management support is needed unless otherwise documented below in the visit note. 

## 2016-10-05 NOTE — Progress Notes (Signed)
I have reviewed and agree with the plan. 

## 2016-10-18 ENCOUNTER — Encounter: Payer: Self-pay | Admitting: Physician Assistant

## 2016-10-24 ENCOUNTER — Encounter: Payer: Self-pay | Admitting: Physician Assistant

## 2016-10-24 ENCOUNTER — Ambulatory Visit (INDEPENDENT_AMBULATORY_CARE_PROVIDER_SITE_OTHER): Payer: Commercial Managed Care - HMO | Admitting: Physician Assistant

## 2016-10-24 VITALS — BP 128/82 | HR 81 | Ht 73.0 in | Wt 292.0 lb

## 2016-10-24 DIAGNOSIS — Z1212 Encounter for screening for malignant neoplasm of rectum: Secondary | ICD-10-CM

## 2016-10-24 DIAGNOSIS — R1314 Dysphagia, pharyngoesophageal phase: Secondary | ICD-10-CM

## 2016-10-24 DIAGNOSIS — Z1211 Encounter for screening for malignant neoplasm of colon: Secondary | ICD-10-CM | POA: Diagnosis not present

## 2016-10-24 DIAGNOSIS — K219 Gastro-esophageal reflux disease without esophagitis: Secondary | ICD-10-CM | POA: Diagnosis not present

## 2016-10-24 MED ORDER — OMEPRAZOLE 40 MG PO CPDR: 40.0000 mg | DELAYED_RELEASE_CAPSULE | Freq: Every day | ORAL | 3 refills | Status: DC

## 2016-10-24 NOTE — Progress Notes (Signed)
Chief Complaint: Dysphagia and screening for colorectal cancer  HPI:  [name] is a 61 year old Caucasian male with a past medical history of A. fib maintained on Coumadin, DVT, GERD, pulmonary embolism and hypertension as well as others listed below, who was referred to me by Justin Levins, MD for a complaint of dysphagia .      Today, the patient presents to clinic and tells me that for the past 2 years he has had episodes of food getting "hung in my throat". Patient tells me that typically this is always a solid food and he will drink something very quickly after it gets stuck and it will help move it along. During these episodes, he does have some esophageal discomfort. Over the last month he feels like these episodes have increased and he is having them at least a couple times a week. Patient also notes that he feels like he has something in his throat constantly and that he has to cough. Patient also tells me he has had an increase in reflux which is typically at night after laying down to sleep. Apparently, he used to use a PPI of some sort, he cannot recall the name, which he stopped 5 years ago and had been doing well until recently. Patient does admit to eating dinner quite late at night.    Of note patient does have history of a pulmonary embolism when coming off of his Coumadin back in the 2000 for a vein surgery in his leg, irregardless of Lovenox bridge, and this has made him very leery of stopping his anticoagulation at any time.   Patient denies fever, chills, blood in his stool, melena, weight loss, fatigue, anorexia, change in bowel habits, nausea, vomiting or abdominal pain.  Past Medical History:  Diagnosis Date  . A-fib (HCC)    post PE only  . Anxiety   . DVT (deep venous thrombosis) (HCC)    2000  . Erectile dysfunction 05/28/2011  . GERD (gastroesophageal reflux disease)   . Hypertension   . Pulmonary embolism (HCC)   . Symptomatic PVCs     Past Surgical History:    Procedure Laterality Date  . TONSILLECTOMY    . VEIN SURGERY     RLE saphenous vein stripping    Current Outpatient Prescriptions  Medication Sig Dispense Refill  . metoprolol succinate (TOPROL-XL) 25 MG 24 hr tablet TAKE 1 TABLET(25 MG) BY MOUTH DAILY 90 tablet 3  . warfarin (COUMADIN) 4 MG tablet TAKE AS DIRECTED BY ANTICOAGULATION CLINIC 180 tablet 1   No current facility-administered medications for this visit.     Allergies as of 10/24/2016 - Review Complete 10/24/2016  Allergen Reaction Noted  . Oxycodone Other (See Comments) 04/16/2015    Family History  Problem Relation Age of Onset  . Heart attack Father   . Diabetes Unknown        first degree relatives    Social History   Social History  . Marital status: Married    Spouse name: N/A  . Number of children: 2  . Years of education: N/A   Occupational History  . 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 Smoking status: Former Games developer  . Smokeless tobacco: Never Used  . Alcohol use No  . Drug use: No  . Sexual activity: Yes   Other Topics Concern  . 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, broadcasting/film/video          Review of Systems:    Constitutional: No weight loss, fever or chills Skin: No rash  Cardiovascular: No chest pain Respiratory: No SOB Gastrointestinal: See HPI and otherwise negative Genitourinary: No dysuria  Neurological: No headache Musculoskeletal: Positive for muscle cramps Hematologic: No bleeding or bruising Psychiatric: Positive for sleeping problems   Physical Exam:  Vital signs: BP 128/82   Pulse 81   Ht 6\' 1"  (1.854 m)   Wt 292 lb (132.5 kg)   SpO2 94%   BMI 38.52 kg/m   Constitutional:   Pleasant Caucasian male appears to be in NAD, Well developed, Well nourished, alert and cooperative Head:  Normocephalic and atraumatic. Eyes:   PEERL, EOMI. No icterus. Conjunctiva  pink. Ears:  Normal auditory acuity. Neck:  Supple Throat: Oral cavity and pharynx without inflammation, swelling or lesion.  Respiratory: Respirations even and unlabored. Lungs clear to auscultation bilaterally.   No wheezes, crackles, or rhonchi.  Cardiovascular: Normal S1, S2. No MRG. Regular rate and rhythm. No peripheral edema, cyanosis or pallor.  Gastrointestinal:  Soft, nondistended, nontender. No rebound or guarding. Normal bowel sounds. No appreciable masses or hepatomegaly. Rectal:  Not performed.  Msk:  Symmetrical without gross deformities. Without edema, no deformity or joint abnormality.  Neurologic:  Alert and  oriented x4;  grossly normal neurologically.  Skin:   Dry and intact without significant lesions or rashes. Psychiatric:  Demonstrates good judgement and reason without abnormal affect or behaviors.  MOST RECENT LABS AND IMAGING: CBC    Component Value Date/Time   WBC 4.9 09/13/2016 1537   RBC 4.63 09/13/2016 1537   HGB 13.7 09/13/2016 1537   HCT 39.5 09/13/2016 1537   PLT 210.0 09/13/2016 1537   MCV 85.3 09/13/2016 1537   MCH 29.6 04/16/2015 1049   MCHC 34.6 09/13/2016 1537   RDW 13.1 09/13/2016 1537   LYMPHSABS 1.8 09/13/2016 1537   MONOABS 0.3 09/13/2016 1537   EOSABS 0.1 09/13/2016 1537   BASOSABS 0.1 09/13/2016 1537    CMP     Component Value Date/Time   NA 139 09/13/2016 1537   K 4.1 09/13/2016 1537   CL 103 09/13/2016 1537   CO2 30 09/13/2016 1537   GLUCOSE 96 09/13/2016 1537   GLUCOSE 102 (H) 05/21/2006 1135   BUN 13 09/13/2016 1537   CREATININE 1.04 09/13/2016 1537   CALCIUM 9.0 09/13/2016 1537   PROT 6.8 09/13/2016 1537   ALBUMIN 4.1 09/13/2016 1537   AST 17 09/13/2016 1537   ALT 20 09/13/2016 1537   ALKPHOS 64 09/13/2016 1537   BILITOT 1.4 (H) 09/13/2016 1537   GFRNONAA >60 04/16/2015 1049   GFRAA >60 04/16/2015 1049    Assessment: 1. Dysphagia: Patient describes food getting hung in his throat over the past 2 years worse over  the past month, now with reflux symptoms; most likely esophageal stricture versus dysmotility versus other 2. Colorectal screening: Patient has never had a colonoscopy but is very leery about coming off of his Coumadin, no family history of colon cancer or polyps, could consider cologuard as an initial test 3. GERD: History in the distant past for which the patient was on a PPI, he has been off of this for 5 years and has now restarted with some nightly reflux symptoms, currently not on medication  Plan: 1. Did discuss the EGD and colonoscopy with the patient today but he  is very leery about coming off of his anticoagulation. We discussed a Lovenox bridge and he is still not sure that he wants to proceed. Did discuss that this was the best way to find colon polyps and likely the only way to "fix" his dysphagia. 2. Due to the above we opted for a barium swallow with tablet for further evaluation of patient's dysphagia as a first step. 3. We also briefly discussed a Cologuard test today, it was explained that if the patient chooses this route and this is positive, his insurance will likely not pay for his colonoscopy. 4. Patient was started on Omeprazole 40 mg once daily, 30-60 minutes before breakfast. 5. Patient will return to clinic in the next 3-4 weeks to discuss results of barium swallow and to also make a final decision in regards to his EGD and colonoscopy. This will be with either me or Dr. Myrtie Neitheranis , as he is the supervising physician today.  Hyacinth MeekerJennifer Cuauhtemoc Huegel, PA-C [facility] 10/24/2016, 3:00 PM  Cc: Justin LevinsJohn, James W, MD

## 2016-10-24 NOTE — Patient Instructions (Signed)
We have sent the following medications to your pharmacy for you to pick up at your convenience: Omeprazole 40 mg daily 30-60 mins before breakfast.   You have been scheduled for a Barium Esophogram at [facility] (1st floor of the hospital) on 11-18-16 at 10:30 am. Please arrive 15 minutes prior to your appointment for registration. Make certain not to have anything to eat or drink 6 hours prior to your test. If you need to reschedule for any reason, please contact radiology at [phone] to do so. __________________________________________________________________ A barium swallow is an examination that concentrates on views of the esophagus. This tends to be a double contrast exam (barium and two liquids which, when combined, create a gas to distend the wall of the oesophagus) or single contrast (non-ionic iodine based). The study is usually tailored to your symptoms so a good history is essential. Attention is paid during the study to the form, structure and configuration of the esophagus, looking for functional disorders (such as aspiration, dysphagia, achalasia, motility and reflux) EXAMINATION You may be asked to change into a gown, depending on the type of swallow being performed. A radiologist and radiographer will perform the procedure. The radiologist will advise you of the type of contrast selected for your procedure and direct you during the exam. You will be asked to stand, sit or lie in several different positions and to hold a small amount of fluid in your mouth before being asked to swallow while the imaging is performed .In some instances you may be asked to swallow barium coated marshmallows to assess the motility of a solid food bolus. The exam can be recorded as a digital or video fluoroscopy procedure. POST PROCEDURE It will take 1-2 days for the barium to pass through your system. To facilitate this, it is important, unless otherwise directed, to increase your fluids for  the next 24-48hrs and to resume your normal diet.  This test typically takes about 30 minutes to perform. __________________________________________________________________________________

## 2016-10-29 NOTE — Progress Notes (Signed)
Thank you for sending this case to me. I have reviewed the entire note, and the outlined plan seems appropriate.  I agree that Cologuard would be the preferred option for him.   Amada JupiterHenry Danis, MD

## 2016-11-01 ENCOUNTER — Encounter: Payer: Self-pay | Admitting: Physician Assistant

## 2016-11-15 ENCOUNTER — Ambulatory Visit (INDEPENDENT_AMBULATORY_CARE_PROVIDER_SITE_OTHER): Payer: Commercial Managed Care - HMO | Admitting: General Practice

## 2016-11-15 DIAGNOSIS — Z5181 Encounter for therapeutic drug level monitoring: Secondary | ICD-10-CM

## 2016-11-15 NOTE — Patient Instructions (Signed)
Pre visit review using our clinic review tool, if applicable. No additional management support is needed unless otherwise documented below in the visit note. 

## 2016-11-17 ENCOUNTER — Encounter: Payer: Self-pay | Admitting: Physician Assistant

## 2016-11-18 ENCOUNTER — Ambulatory Visit (HOSPITAL_COMMUNITY): Payer: Commercial Managed Care - HMO

## 2016-11-22 ENCOUNTER — Ambulatory Visit (INDEPENDENT_AMBULATORY_CARE_PROVIDER_SITE_OTHER): Payer: Commercial Managed Care - HMO | Admitting: General Practice

## 2016-11-22 DIAGNOSIS — Z5181 Encounter for therapeutic drug level monitoring: Secondary | ICD-10-CM

## 2016-11-22 DIAGNOSIS — I4891 Unspecified atrial fibrillation: Secondary | ICD-10-CM

## 2016-11-22 LAB — POCT INR: INR: 3.2

## 2016-11-22 NOTE — Progress Notes (Signed)
I have reviewed and agree with the plan. 

## 2016-11-22 NOTE — Patient Instructions (Signed)
Pre visit review using our clinic review tool, if applicable. No additional management support is needed unless otherwise documented below in the visit note. 

## 2016-11-25 ENCOUNTER — Ambulatory Visit (HOSPITAL_COMMUNITY): Payer: Commercial Managed Care - HMO

## 2016-11-29 ENCOUNTER — Encounter: Payer: Self-pay | Admitting: Internal Medicine

## 2016-11-29 ENCOUNTER — Ambulatory Visit (INDEPENDENT_AMBULATORY_CARE_PROVIDER_SITE_OTHER): Payer: 59 | Admitting: Internal Medicine

## 2016-11-29 VITALS — BP 124/86 | HR 77 | Ht 73.0 in | Wt 293.0 lb

## 2016-11-29 DIAGNOSIS — M5412 Radiculopathy, cervical region: Secondary | ICD-10-CM

## 2016-11-29 DIAGNOSIS — K219 Gastro-esophageal reflux disease without esophagitis: Secondary | ICD-10-CM

## 2016-11-29 DIAGNOSIS — J019 Acute sinusitis, unspecified: Secondary | ICD-10-CM

## 2016-11-29 DIAGNOSIS — J329 Chronic sinusitis, unspecified: Secondary | ICD-10-CM | POA: Insufficient documentation

## 2016-11-29 MED ORDER — LEVOFLOXACIN 250 MG PO TABS: 250.0000 mg | ORAL_TABLET | Freq: Every day | ORAL | 0 refills | Status: AC

## 2016-11-29 NOTE — Progress Notes (Signed)
Subjective:    Patient ID: Justin Woodard, male    DOB: 05/13/1956, 61 y.o.   MRN: [id number]  HPI   Here with 2-3 days acute onset fever, facial pain, pressure, headache, general weakness and malaise, and greenish d/c, with mild ST and cough, but pt denies chest pain, wheezing, increased sob or doe, orthopnea, PND, increased LE swelling, palpitations, dizziness or syncope.  Has had mild worsening reflux having run out of his PPI, but no other abd pain, dysphagia, n/v, bowel change or blood.  Also has 1 yr ongoing and gradually worsening right neck pain, with now 2 wks constant pain and radiation to the distal arm, with mild decreased grip strength.  Nothing else makes better or worse.    Past Medical History:  Diagnosis Date  . A-fib (HCC)    post PE only  . Anxiety   . DVT (deep venous thrombosis) (HCC)    2000  . Erectile dysfunction 05/28/2011  . GERD (gastroesophageal reflux disease)   . Hypertension   . Pulmonary embolism (HCC)   . Symptomatic PVCs    Past Surgical History:  Procedure Laterality Date  . TONSILLECTOMY    . VEIN SURGERY     RLE saphenous vein stripping    reports that he has quit smoking. He has never used smokeless tobacco. He reports that he does not drink alcohol or use drugs. family history includes Diabetes in his unknown relative; Heart attack in his father. Allergies  Allergen Reactions  . Oxycodone Other (See Comments)    "Just made me feel bad"   Current Outpatient Prescriptions on File Prior to Visit  Medication Sig Dispense Refill  . metoprolol succinate (TOPROL-XL) 25 MG 24 hr tablet TAKE 1 TABLET(25 MG) BY MOUTH DAILY 90 tablet 3  . warfarin (COUMADIN) 4 MG tablet TAKE AS DIRECTED BY ANTICOAGULATION CLINIC 180 tablet 1   No current facility-administered medications on file prior to visit.    Review of Systems  Constitutional: Negative for other unusual diaphoresis or sweats HENT: Negative for ear discharge or swelling Eyes: Negative for other  worsening visual disturbances Respiratory: Negative for stridor or other swelling  Gastrointestinal: Negative for worsening distension or other blood Genitourinary: Negative for retention or other urinary change Musculoskeletal: Negative for other MSK pain or swelling Skin: Negative for color change or other new lesions Neurological: Negative for worsening tremors and other numbness  Psychiatric/Behavioral: Negative for worsening agitation or other fatigue All other system neg per pt    Objective:   Physical Exam BP 124/86   Pulse 77   Ht 6\' 1"  (1.854 m)   Wt 293 lb (132.9 kg)   SpO2 100%   BMI 38.66 kg/m  VS noted, mild ill Constitutional: Pt appears in NAD HENT: Head: NCAT.  Right Ear: External ear normal.  Left Ear: External ear normal.  Eyes: . Pupils are equal, round, and reactive to light. Conjunctivae and EOM are normal Nose: without d/c or deformity Bilat tm's with mild erythema.  Max sinus areas mild tender.  Pharynx with mild erythema, no exudate Neck: Neck supple. Gross normal ROM Cardiovascular: Normal rate and regular rhythm.   Pulmonary/Chest: Effort normal and breath sounds without rales or wheezing.  Abd: soft, NT, + BS Spine: tender midline and low right paravertebral  Neurological: Pt is alert. At baseline orientation, motor 5/5 intact except RUE 4+/5 motor and slight decreased right grip strength Skin: Skin is warm. No rashes, other new lesions, no LE edema Psychiatric: Pt  behavior is normal without agitation  No other exam findings    Assessment & Plan:

## 2016-11-29 NOTE — Patient Instructions (Signed)
Please take all new medication as prescribed - the antibiotic  You can also take Delsym OTC for cough, and/or Mucinex (or it's generic off brand) for congestion, and tylenol as needed for pain.  Please continue all other medications as before, including the omeprazole  Please have the pharmacy call with any other refills you may need.  Please continue your efforts at being more active, low cholesterol diet, and weight control.  Please keep your appointments with your specialists as you may have planned  You will be contacted regarding the referral for: MRI for the neck, and orthopedic

## 2016-12-01 NOTE — Assessment & Plan Note (Signed)
With neuro change, mild to mod for MRI c-spine and ortho referral

## 2016-12-01 NOTE — Assessment & Plan Note (Signed)
Mild to mod, for PPI restart,  to f/u any worsening symptoms or concerns 

## 2016-12-01 NOTE — Assessment & Plan Note (Signed)
Mild to mod, for antibx course,  to f/u any worsening symptoms or concerns 

## 2016-12-06 ENCOUNTER — Ambulatory Visit: Payer: Commercial Managed Care - HMO | Admitting: Physician Assistant

## 2016-12-10 ENCOUNTER — Ambulatory Visit: Payer: Commercial Managed Care - HMO | Admitting: Physician Assistant

## 2016-12-23 ENCOUNTER — Ambulatory Visit
Admission: RE | Admit: 2016-12-23 | Discharge: 2016-12-23 | Disposition: A | Discharge status: Home / Self Care | Payer: 59 | Source: Ambulatory Visit | Attending: Internal Medicine | Admitting: Internal Medicine

## 2016-12-23 ENCOUNTER — Other Ambulatory Visit: Payer: 59

## 2016-12-23 DIAGNOSIS — M5412 Radiculopathy, cervical region: Secondary | ICD-10-CM

## 2016-12-23 DIAGNOSIS — M4802 Spinal stenosis, cervical region: Secondary | ICD-10-CM | POA: Diagnosis not present

## 2016-12-25 DIAGNOSIS — M5412 Radiculopathy, cervical region: Secondary | ICD-10-CM | POA: Diagnosis not present

## 2017-01-03 ENCOUNTER — Ambulatory Visit (INDEPENDENT_AMBULATORY_CARE_PROVIDER_SITE_OTHER): Payer: 59 | Admitting: General Practice

## 2017-01-03 ENCOUNTER — Other Ambulatory Visit: Payer: Self-pay | Admitting: General Practice

## 2017-01-03 DIAGNOSIS — Z5181 Encounter for therapeutic drug level monitoring: Secondary | ICD-10-CM | POA: Diagnosis not present

## 2017-01-03 DIAGNOSIS — I4891 Unspecified atrial fibrillation: Secondary | ICD-10-CM

## 2017-01-03 LAB — POCT INR: INR: 2.7

## 2017-01-03 MED ORDER — OMEPRAZOLE 40 MG PO CPDR: 40.0000 mg | DELAYED_RELEASE_CAPSULE | Freq: Every day | ORAL | 3 refills | Status: DC

## 2017-01-03 NOTE — Patient Instructions (Signed)
Pre visit review using our clinic review tool, if applicable. No additional management support is needed unless otherwise documented below in the visit note. 

## 2017-02-07 ENCOUNTER — Ambulatory Visit (INDEPENDENT_AMBULATORY_CARE_PROVIDER_SITE_OTHER): Payer: 59 | Admitting: Physician Assistant

## 2017-02-07 ENCOUNTER — Encounter: Payer: Self-pay | Admitting: Physician Assistant

## 2017-02-07 VITALS — BP 138/78 | HR 72 | Ht 72.0 in | Wt 299.0 lb

## 2017-02-07 DIAGNOSIS — R0789 Other chest pain: Secondary | ICD-10-CM | POA: Diagnosis not present

## 2017-02-07 DIAGNOSIS — I480 Paroxysmal atrial fibrillation: Secondary | ICD-10-CM

## 2017-02-07 DIAGNOSIS — I10 Essential (primary) hypertension: Secondary | ICD-10-CM | POA: Diagnosis not present

## 2017-02-07 NOTE — Patient Instructions (Signed)
Medication Instructions: Your physician recommends that you continue on your current medications as directed. Please refer to the Current Medication list given to you today.  Labwork: None Ordered  Procedures/Testing: None Ordered  Follow-Up: Your physician wants you to follow-up in: 1 YEAR with Dr. Ladona Ridgel. You will receive a reminder letter in the mail two months in advance. If you don't receive a letter, please call our office to schedule the follow-up appointment.   If you need a refill on your cardiac medications before your next appointment, please call your pharmacy.

## 2017-02-07 NOTE — Progress Notes (Signed)
Cardiology Office Note Date:  02/07/2017  Patient ID:  Justin Woodard, Justin Woodard 12-Apr-1956, MRN [id number] PCP:  Corwin Levins, MD  Electrophysiologist:  Dr. Ladona Ridgel   Chief Complaint: over-due visit  History of Present Illness: Justin Woodard is a 61 y.o. male with history of AF associated with DVT/PE (2000), HTN, GERD, PVCs.  He comes today to be seen for Dr. Ladona Ridgel.  Last seen by him in March 2017, at that time doing well, no changes were made to his tx.  He follows his labs including his coumadin checks with his PMD. He has been well, did not realize he was over due for a visit.  He has not had any palpitations.  In the last couple weeks he has had  A couple episodes of CP, located to L sternal boarder, only has happened at rest, he can not describe it, but also notes tenderness to his chest when he is having it and palpation of the area when he feels it changes it, lasts a few seconds in general, never more then about 20 seconds in duration,  No radiation, no associate SOB, diaphoresis, N/V or symptoms otherwise at all either with this symptom or without it.  His job is fairly sedentary but when working about the house/yard, shopping he has never noted any kind of CP, no changes or decrease in his exertional capacity.  No dizziness, near syncope or syncope.   Past Medical History:  Diagnosis Date  . A-fib (HCC)    post PE only  . Anxiety   . DVT (deep venous thrombosis) (HCC)    2000  . Erectile dysfunction 05/28/2011  . GERD (gastroesophageal reflux disease)   . Hypertension   . Pulmonary embolism (HCC)   . Symptomatic PVCs     Past Surgical History:  Procedure Laterality Date  . TONSILLECTOMY    . VEIN SURGERY     RLE saphenous vein stripping    Current Outpatient Prescriptions  Medication Sig Dispense Refill  . metoprolol succinate (TOPROL-XL) 25 MG 24 hr tablet TAKE 1 TABLET(25 MG) BY MOUTH DAILY 90 tablet 3  . omeprazole (PRILOSEC) 40 MG capsule Take 1 tablet (40 mg) by  mouth on Monday, Wednesday, and Friday    . warfarin (COUMADIN) 4 MG tablet TAKE AS DIRECTED BY ANTICOAGULATION CLINIC 180 tablet 1   No current facility-administered medications for this visit.     Allergies:   Oxycodone   Social History:  The patient  reports that he has quit smoking. He has never used smokeless tobacco. He reports that he does not drink alcohol or use drugs.   Family History:  The patient's family history includes Diabetes in his unknown relative; Heart attack in his father.  ROS:  Please see the history of present illness.All other systems are reviewed and otherwise negative.   PHYSICAL EXAM:  VS:  BP 138/78   Pulse 72   Ht 6' (1.829 m)   Wt 299 lb (135.6 kg)   SpO2 97%   BMI 40.55 kg/m  BMI: Body mass index is 40.55 kg/m. Well nourished, well developed, in no acute distress  HEENT: normocephalic, atraumatic  Neck: no JVD, carotid bruits or masses Cardiac:  RRR; no significant murmurs, no rubs, or gallops Lungs:  CTA b/l, no wheezing, rhonchi or rales  Abd: soft, nontender MS: no deformity atrophy Ext: trace edema b/l (reported as chronic and at baseline with known severe varicose veins) Skin: warm and dry, no rash Neuro:  No  gross deficits appreciated Psych: euthymic mood, full affect   EKG:  Done today and reviewed by myself shows SR, no ischemic changes, appears similar to older  08/02/16 TTE Study Conclusions - Left ventricle: The cavity size was normal. Wall thickness was increased in a pattern of mild LVH. Systolic function was normal. The estimated ejection fraction was in the range of 60% to 65%. Wall motion was normal; there were no regional wall motion abnormalities. - Left atrium: The atrium was mildly dilated. - Right atrium: The atrium was mildly dilated.   Recent Labs: 09/13/2016: ALT 20; BUN 13; Creatinine, Ser 1.04; Hemoglobin 13.7; Platelets 210.0; Potassium 4.1; Sodium 139; TSH 2.63  09/13/2016: Cholesterol 182; Direct LDL  85.0; HDL 40.30; Total CHOL/HDL Ratio 5; Triglycerides 258.0; VLDL 51.6   CrCl cannot be calculated (Patient's most recent lab result is older than the maximum 21 days allowed.).   Wt Readings from Last 3 Encounters:  02/07/17 299 lb (135.6 kg)  11/29/16 293 lb (132.9 kg)  10/24/16 292 lb (132.5 kg)     Other studies reviewed: Additional studies/records reviewed today include: summarized above  ASSESSMENT AND PLAN:  1. Paroxysmal AFib     CHA2DS2Vasc is 1, on warfarin 2/2 DVT/PE hx     No symptoms to suggest recurrent AF  2. HTN     No changes  3. CP     Sounds musculoskeletal, he is able to change/worsen the pain by palpation when he has it  We discussed CV risk factor reduction, healthy diet, weight loss and exercise.  He walks occasionally.  He follows his lipids with his PMD, he quit smoking 18 years ago, no ETOH   Disposition: F/u with EP annually, sooner if needed  Current medicines are reviewed at length with the patient today.  The patient did not have any concerns regarding medicines.  Norma Fredrickson, PA-C 02/07/2017 2:42 PM     [facility] [address] [place] [address] [phone] (office)  [phone] (fax)

## 2017-02-14 ENCOUNTER — Ambulatory Visit (INDEPENDENT_AMBULATORY_CARE_PROVIDER_SITE_OTHER): Payer: 59 | Admitting: General Practice

## 2017-02-14 ENCOUNTER — Ambulatory Visit: Payer: 59

## 2017-02-14 DIAGNOSIS — Z5181 Encounter for therapeutic drug level monitoring: Secondary | ICD-10-CM

## 2017-02-14 DIAGNOSIS — Z7901 Long term (current) use of anticoagulants: Secondary | ICD-10-CM

## 2017-02-14 LAB — POCT INR: INR: 3

## 2017-02-14 NOTE — Patient Instructions (Signed)
Pre visit review using our clinic review tool, if applicable. No additional management support is needed unless otherwise documented below in the visit note. 

## 2017-03-28 ENCOUNTER — Ambulatory Visit: Payer: 59 | Admitting: General Practice

## 2017-03-28 DIAGNOSIS — Z7901 Long term (current) use of anticoagulants: Secondary | ICD-10-CM

## 2017-03-28 LAB — POCT INR: INR: 2.8

## 2017-03-28 NOTE — Patient Instructions (Signed)
Pre visit review using our clinic review tool, if applicable. No additional management support is needed unless otherwise documented below in the visit note. 

## 2017-05-09 ENCOUNTER — Ambulatory Visit: Payer: 59 | Admitting: General Practice

## 2017-05-09 DIAGNOSIS — I4891 Unspecified atrial fibrillation: Secondary | ICD-10-CM

## 2017-05-09 DIAGNOSIS — Z7901 Long term (current) use of anticoagulants: Secondary | ICD-10-CM

## 2017-05-09 LAB — POCT INR: INR: 3.2

## 2017-05-09 NOTE — Patient Instructions (Addendum)
Pre visit review using our clinic review tool, if applicable. No additional management support is needed unless otherwise documented below in the visit note.  Skip today and then continue to take 8 mg daily and 4 mg on Wednesday.  Re-check in 4 weeks.

## 2017-05-18 ENCOUNTER — Other Ambulatory Visit: Payer: Self-pay | Admitting: Internal Medicine

## 2017-05-19 ENCOUNTER — Other Ambulatory Visit: Payer: Self-pay | Admitting: General Practice

## 2017-05-19 ENCOUNTER — Telehealth: Payer: Self-pay | Admitting: Internal Medicine

## 2017-05-19 DIAGNOSIS — J22 Unspecified acute lower respiratory infection: Secondary | ICD-10-CM | POA: Diagnosis not present

## 2017-05-19 DIAGNOSIS — R05 Cough: Secondary | ICD-10-CM | POA: Diagnosis not present

## 2017-05-19 DIAGNOSIS — R509 Fever, unspecified: Secondary | ICD-10-CM | POA: Diagnosis not present

## 2017-05-19 MED ORDER — WARFARIN SODIUM 4 MG PO TABS: ORAL_TABLET | ORAL | 1 refills | Status: DC

## 2017-05-19 NOTE — Telephone Encounter (Signed)
Copied from CRM [phone]. Topic: Inquiry >> May 19, 2017 11:13 AM Yvonna Alanisobinson, Andra M wrote: Reason for CRM: Patient needs a refill of Warfarin (COUMADIN) 4 MG tablet. Patient only has enough to last through Friday 05/23/2017. Patient's preferred pharmacy is [phone] - [place], [place] - [address] [phone] (Phone).       Thank You!!! [phone] (Fax)

## 2017-05-19 NOTE — Telephone Encounter (Signed)
Noted. [facility] [address]

## 2017-05-22 DIAGNOSIS — Z7901 Long term (current) use of anticoagulants: Secondary | ICD-10-CM | POA: Diagnosis not present

## 2017-05-22 DIAGNOSIS — J209 Acute bronchitis, unspecified: Secondary | ICD-10-CM | POA: Diagnosis not present

## 2017-05-22 DIAGNOSIS — R05 Cough: Secondary | ICD-10-CM | POA: Diagnosis not present

## 2017-05-23 ENCOUNTER — Ambulatory Visit: Payer: 59

## 2017-05-23 ENCOUNTER — Ambulatory Visit: Payer: 59 | Admitting: Internal Medicine

## 2017-05-23 ENCOUNTER — Ambulatory Visit: Payer: 59 | Admitting: General Practice

## 2017-05-23 DIAGNOSIS — I4891 Unspecified atrial fibrillation: Secondary | ICD-10-CM

## 2017-05-23 DIAGNOSIS — Z7901 Long term (current) use of anticoagulants: Secondary | ICD-10-CM

## 2017-05-23 LAB — POCT INR: INR: 2.1

## 2017-05-23 NOTE — Patient Instructions (Addendum)
Pre visit review using our clinic review tool, if applicable. No additional management support is needed unless otherwise documented below in the visit note.  Take 12 mg (3 tablets) continue to take 8 mg daily and 4 mg on Wednesday.  Re-check in 1  weeks.

## 2017-05-30 ENCOUNTER — Ambulatory Visit: Payer: 59 | Admitting: General Practice

## 2017-05-30 DIAGNOSIS — I4891 Unspecified atrial fibrillation: Secondary | ICD-10-CM

## 2017-05-30 DIAGNOSIS — Z7901 Long term (current) use of anticoagulants: Secondary | ICD-10-CM | POA: Diagnosis not present

## 2017-05-30 LAB — POCT INR: INR: 3.5

## 2017-05-30 NOTE — Patient Instructions (Addendum)
Pre visit review using our clinic review tool, if applicable. No additional management support is needed unless otherwise documented below in the visit note.  Skip coumadin today (1/11) and then continue to take 8 mg daily and 4 mg on Wednesday.  Re-check in 1  weeks.

## 2017-06-06 ENCOUNTER — Ambulatory Visit: Payer: 59

## 2017-06-06 ENCOUNTER — Ambulatory Visit: Payer: 59 | Admitting: General Practice

## 2017-06-06 DIAGNOSIS — Z7901 Long term (current) use of anticoagulants: Secondary | ICD-10-CM | POA: Diagnosis not present

## 2017-06-06 LAB — POCT INR: INR: 2.3

## 2017-06-06 NOTE — Patient Instructions (Addendum)
Pre visit review using our clinic review tool, if applicable. No additional management support is needed unless otherwise documented below in the visit note.  Continue to take 8 mg daily and 4 mg on Wednesday.  Re-check in 4  weeks.

## 2017-06-20 ENCOUNTER — Other Ambulatory Visit: Payer: Self-pay

## 2017-06-20 ENCOUNTER — Emergency Department (HOSPITAL_COMMUNITY)
Admission: EM | Admit: 2017-06-20 | Discharge: 2017-06-20 | Disposition: A | Discharge status: Home / Self Care | Payer: 59 | Attending: Emergency Medicine | Admitting: Emergency Medicine

## 2017-06-20 ENCOUNTER — Emergency Department (HOSPITAL_COMMUNITY): Payer: 59

## 2017-06-20 ENCOUNTER — Encounter (HOSPITAL_COMMUNITY): Payer: Self-pay | Admitting: *Deleted

## 2017-06-20 DIAGNOSIS — Z79899 Other long term (current) drug therapy: Secondary | ICD-10-CM | POA: Insufficient documentation

## 2017-06-20 DIAGNOSIS — Z87891 Personal history of nicotine dependence: Secondary | ICD-10-CM | POA: Insufficient documentation

## 2017-06-20 DIAGNOSIS — I4891 Unspecified atrial fibrillation: Secondary | ICD-10-CM | POA: Insufficient documentation

## 2017-06-20 DIAGNOSIS — R51 Headache: Secondary | ICD-10-CM | POA: Diagnosis not present

## 2017-06-20 DIAGNOSIS — Z7901 Long term (current) use of anticoagulants: Secondary | ICD-10-CM | POA: Insufficient documentation

## 2017-06-20 DIAGNOSIS — R55 Syncope and collapse: Secondary | ICD-10-CM | POA: Diagnosis not present

## 2017-06-20 DIAGNOSIS — R5383 Other fatigue: Secondary | ICD-10-CM | POA: Diagnosis not present

## 2017-06-20 DIAGNOSIS — I10 Essential (primary) hypertension: Secondary | ICD-10-CM | POA: Diagnosis not present

## 2017-06-20 DIAGNOSIS — R42 Dizziness and giddiness: Secondary | ICD-10-CM | POA: Diagnosis not present

## 2017-06-20 LAB — CBC
HCT: 41.3 % (ref 39.0–52.0)
Hemoglobin: 13.9 g/dL (ref 13.0–17.0)
MCH: 29.2 pg (ref 26.0–34.0)
MCHC: 33.7 g/dL (ref 30.0–36.0)
MCV: 86.8 fL (ref 78.0–100.0)
PLATELETS: 189 10*3/uL (ref 150–400)
RBC: 4.76 MIL/uL (ref 4.22–5.81)
RDW: 13.5 % (ref 11.5–15.5)
WBC: 4.9 10*3/uL (ref 4.0–10.5)

## 2017-06-20 LAB — PROTIME-INR
INR: 1.9
Prothrombin Time: 21.6 seconds — ABNORMAL HIGH (ref 11.4–15.2)

## 2017-06-20 LAB — URINALYSIS, ROUTINE W REFLEX MICROSCOPIC
BILIRUBIN URINE: NEGATIVE
Glucose, UA: NEGATIVE mg/dL
Hgb urine dipstick: NEGATIVE
KETONES UR: NEGATIVE mg/dL
LEUKOCYTES UA: NEGATIVE
NITRITE: NEGATIVE
PROTEIN: NEGATIVE mg/dL
Specific Gravity, Urine: 1.008 (ref 1.005–1.030)
pH: 6 (ref 5.0–8.0)

## 2017-06-20 LAB — CBG MONITORING, ED: GLUCOSE-CAPILLARY: 93 mg/dL (ref 65–99)

## 2017-06-20 LAB — BASIC METABOLIC PANEL
ANION GAP: 13 (ref 5–15)
BUN: 11 mg/dL (ref 6–20)
CALCIUM: 9 mg/dL (ref 8.9–10.3)
CO2: 22 mmol/L (ref 22–32)
Chloride: 103 mmol/L (ref 101–111)
Creatinine, Ser: 1.15 mg/dL (ref 0.61–1.24)
GFR calc Af Amer: >60 mL/min (ref >60)
GFR calc non Af Amer: >60 mL/min (ref >60)
GLUCOSE: 104 mg/dL — AB (ref 65–99)
POTASSIUM: 4.1 mmol/L (ref 3.5–5.1)
SODIUM: 138 mmol/L (ref 135–145)

## 2017-06-20 LAB — I-STAT TROPONIN, ED: TROPONIN I, POC: 0 ng/mL (ref 0.00–0.08)

## 2017-06-20 NOTE — ED Notes (Signed)
Pt verbalized understanding of discharge instructions and denies any further questions at this time.   

## 2017-06-20 NOTE — ED Notes (Signed)
CBG 93 

## 2017-06-20 NOTE — Discharge Instructions (Signed)
You were evaluated for lightheadedness in the emergency department.  We did not find a specific reason for this.  Your blood counts chest x-ray and EKG all were unremarkable.  you should continue to stay well-hydrated and take your regular medications and follow-up with your primary care doctor.  If any of your symptoms worsen you should return to the emergency department.

## 2017-06-20 NOTE — ED Provider Notes (Signed)
[facility] EMERGENCY DEPARTMENT Provider Note   CSN: [id number] Arrival date & time: 06/20/17  4098     History   Chief Complaint Chief Complaint  Patient presents with  . Dizziness  . Near Syncope    HPI Justin Woodard is a 62 y.o. male.  The history is provided by the patient.  Dizziness  Quality:  Lightheadedness Severity:  Moderate Onset quality:  Sudden Timing:  Intermittent Progression:  Unchanged Chronicity:  New Context: not with loss of consciousness   Relieved by:  Nothing Worsened by:  Nothing Ineffective treatments:  None tried Associated symptoms: blood in stool (red on TP when wipes, intermittent), headaches and weakness   Associated symptoms: no chest pain, no diarrhea, no nausea, no palpitations, no shortness of breath, no syncope, no tinnitus, no vision changes and no vomiting   Near Syncope  Associated symptoms include headaches. Pertinent negatives include no chest pain, no abdominal pain and no shortness of breath.    62 year old male with prior history on warfarin for anticoagulation, here with 3 or 4 days of dizziness.  States that been intermittent and sometimes will occur at rest.  He says that he will feel lightheaded and feel like he might pass out.  He has had no syncopal events.  He also is complaining of just generalized fatigue over that same period of time.  He has had a good appetite has been taking good fluids.  Last month he had a restaurant infection was on antibiotics and he states that his INR has been low since then.  He has a remote history of PE in early 2000s that he recalls the symptoms were felt nothing like this.  He is got no shortness of breath or chest pain no abdominal pain no nausea vomiting diarrhea.  No urinary symptoms.  No recent trauma.  Past Medical History:  Diagnosis Date  . A-fib (HCC)    post PE only  . Anxiety   . DVT (deep venous thrombosis) (HCC)    2000  . Erectile dysfunction 05/28/2011  .  GERD (gastroesophageal reflux disease)   . Hypertension   . Pulmonary embolism (HCC)   . Symptomatic PVCs     Patient Active Problem List   Diagnosis Date Noted  . Long term (current) use of anticoagulants 02/14/2017  . Acute sinus infection 11/29/2016  . Dysphagia 09/13/2016  . Radiculitis of right cervical region 09/13/2016  . Hyperglycemia 08/08/2015  . Encounter for therapeutic drug monitoring 06/22/2013  . Shingles 02/06/2012  . Erectile dysfunction 05/28/2011  . Preventative health care 05/25/2011  . Long term current use of anticoagulant 07/03/2010  . Anxiety state 04/20/2007  . Essential hypertension 04/20/2007  . PE 04/20/2007  . ATRIAL FIBRILLATION 04/20/2007  . PREMATURE VENTRICULAR CONTRACTIONS 04/20/2007  . GERD 04/20/2007  . CHEST PAIN 04/20/2007  . Personal history of venous thrombosis and embolism 04/20/2007  . DEEP VENOUS THROMBOPHLEBITIS, HX OF 04/20/2007    Past Surgical History:  Procedure Laterality Date  . TONSILLECTOMY    . VEIN SURGERY     RLE saphenous vein stripping       Home Medications    Prior to Admission medications   Medication Sig Start Date End Date Taking? Authorizing Provider  metoprolol succinate (TOPROL-XL) 25 MG 24 hr tablet TAKE 1 TABLET(25 MG) BY MOUTH DAILY 09/13/16   Corwin Levins, MD  omeprazole (PRILOSEC) 40 MG capsule Take 1 tablet (40 mg) by mouth on Monday, Wednesday, and Friday  [provider]  warfarin (COUMADIN) 4 MG tablet TAKE AS DIRECTED BY ANTICOAGULATION CLINIC 05/19/17   Corwin Levins, MD    Family History Family History  Problem Relation Age of Onset  . Heart attack Father   . Diabetes Unknown        first degree relatives    Social History Social History   Tobacco Use  . Smoking status: Former Games developer  . Smokeless tobacco: Never Used  Substance Use Topics  . Alcohol use: No    Alcohol/week: 0.0 oz  . Drug use: No     Allergies   Oxycodone   Review of Systems Review of  Systems  Constitutional: Positive for fatigue. Negative for chills and fever.  HENT: Negative for ear pain, sore throat and tinnitus.   Eyes: Negative for pain and visual disturbance.  Respiratory: Negative for cough and shortness of breath.   Cardiovascular: Positive for near-syncope. Negative for chest pain, palpitations and syncope.  Gastrointestinal: Positive for blood in stool (red on TP when wipes, intermittent). Negative for abdominal pain, diarrhea, nausea and vomiting.  Genitourinary: Negative for dysuria and hematuria.  Musculoskeletal: Negative for arthralgias and back pain.  Skin: Negative for color change and rash.  Neurological: Positive for dizziness, weakness and headaches. Negative for seizures and syncope.  All other systems reviewed and are negative.    Physical Exam Updated Vital Signs BP (!) 192/105 (BP Location: Right Arm)   Pulse 73   Temp 98.2 F (36.8 C) (Oral)   Resp 17   SpO2 100%   Physical Exam  Constitutional: He appears well-developed and well-nourished.  HENT:  Head: Normocephalic and atraumatic.  Eyes: Conjunctivae are normal.  Neck: Neck supple.  Cardiovascular: Normal rate and regular rhythm.  No murmur heard. Pulmonary/Chest: Effort normal and breath sounds normal. No respiratory distress.  Abdominal: Soft. There is no tenderness.  Musculoskeletal: He exhibits no edema.  Neurological: He is alert. He has normal strength. No cranial nerve deficit or sensory deficit. Coordination and gait normal. GCS eye subscore is 4. GCS verbal subscore is 5. GCS motor subscore is 6.  Skin: Skin is warm and dry.  Psychiatric: He has a normal mood and affect.  Nursing note and vitals reviewed.    ED Treatments / Results  Labs (all labs ordered are listed, but only abnormal results are displayed) Labs Reviewed  BASIC METABOLIC PANEL - Abnormal; Notable for the following components:      Result Value   Glucose, Bld 104 (*)    All other components  within normal limits  URINALYSIS, ROUTINE W REFLEX MICROSCOPIC - Abnormal; Notable for the following components:   Color, Urine STRAW (*)    All other components within normal limits  PROTIME-INR - Abnormal; Notable for the following components:   Prothrombin Time 21.6 (*)    All other components within normal limits  CBC  CBG MONITORING, ED  I-STAT TROPONIN, ED    EKG  EKG Interpretation  Date/Time:  Friday June 20 2017 08:40:36 EST Ventricular Rate:  70 PR Interval:  204 QRS Duration: 98 QT Interval:  412 QTC Calculation: 444 R Axis:   49 Text Interpretation:  Normal sinus rhythm Normal ECG no acute st/ts Confirmed by Meridee Score [phone]) on 06/20/2017 9:47:26 AM       Radiology Dg Chest 2 View  Result Date: 06/20/2017 CLINICAL DATA:  62 year old male with history of intermittent dizziness for the past 4 days. History of deep venous thrombosis. EXAM: CHEST  2  VIEW COMPARISON:  Chest x-ray 04/16/2015. FINDINGS: Lung volumes are normal. No consolidative airspace disease. No pleural effusions. No pneumothorax. No pulmonary nodule or mass noted. Pulmonary vasculature and the cardiomediastinal silhouette are within normal limits. IMPRESSION: No radiographic evidence of acute cardiopulmonary disease. Electronically Signed   By: Trudie Reedaniel  Entrikin M.D.   On: 06/20/2017 12:48    Procedures Procedures (including critical care time)  Medications Ordered in ED Medications - No data to display   Initial Impression / Assessment and Plan / ED Course  I have reviewed the triage vital signs and the nursing notes.  Pertinent labs & imaging results that were available during my care of the patient were reviewed by me and considered in my medical decision making (see chart for details).  Clinical Course as of Jun 21 610  Fri Jun 20, 2017  1324 Patient's labs are unremarkable.  He is feeling back to baseline.  Recommended that he continues to hydrated and follow-up with his regular  primary care doctor and return if any worsening.  [MB]    Clinical Course User Index [MB] Terrilee FilesButler, Leta Bucklin C, MD    Differential diagnosis of arrhythmia, GI bleed with anemia, metabolic abnormalities, infection including pneumonia.  Patient's workup is been unremarkable and he is steady on his feet here.  He is comfortable going home and will follow up with his primary care doctor.  He will return if any worsening symptoms.  Final Clinical Impressions(s) / ED Diagnoses   Final diagnoses:  Near syncope    ED Discharge Orders    None       Terrilee FilesButler, Jibran Crookshanks C, MD 06/21/17 [phone]

## 2017-06-20 NOTE — ED Notes (Signed)
ED Provider at bedside. 

## 2017-06-20 NOTE — ED Notes (Addendum)
Pts blood collected at triage. Will add on PT INR.

## 2017-06-20 NOTE — ED Triage Notes (Signed)
Pt reports having dizziness and feeling lightheaded since Tuesday. Reports recent rectal bleeding and feeling lightheaded, has hot/cold flashes. No neuro deficits noted at triage.

## 2017-06-25 ENCOUNTER — Other Ambulatory Visit (INDEPENDENT_AMBULATORY_CARE_PROVIDER_SITE_OTHER): Payer: 59

## 2017-06-25 ENCOUNTER — Encounter: Payer: Self-pay | Admitting: Internal Medicine

## 2017-06-25 ENCOUNTER — Ambulatory Visit: Payer: 59 | Admitting: Internal Medicine

## 2017-06-25 VITALS — BP 128/88 | HR 72 | Temp 98.4°F | Ht 72.0 in | Wt 298.0 lb

## 2017-06-25 DIAGNOSIS — Z114 Encounter for screening for human immunodeficiency virus [HIV]: Secondary | ICD-10-CM | POA: Diagnosis not present

## 2017-06-25 DIAGNOSIS — J309 Allergic rhinitis, unspecified: Secondary | ICD-10-CM | POA: Diagnosis not present

## 2017-06-25 DIAGNOSIS — Z23 Encounter for immunization: Secondary | ICD-10-CM | POA: Diagnosis not present

## 2017-06-25 DIAGNOSIS — Z1159 Encounter for screening for other viral diseases: Secondary | ICD-10-CM | POA: Diagnosis not present

## 2017-06-25 DIAGNOSIS — R739 Hyperglycemia, unspecified: Secondary | ICD-10-CM

## 2017-06-25 DIAGNOSIS — M25561 Pain in right knee: Secondary | ICD-10-CM | POA: Diagnosis not present

## 2017-06-25 DIAGNOSIS — Z0001 Encounter for general adult medical examination with abnormal findings: Secondary | ICD-10-CM

## 2017-06-25 DIAGNOSIS — I10 Essential (primary) hypertension: Secondary | ICD-10-CM

## 2017-06-25 DIAGNOSIS — H6981 Other specified disorders of Eustachian tube, right ear: Secondary | ICD-10-CM

## 2017-06-25 DIAGNOSIS — H6991 Unspecified Eustachian tube disorder, right ear: Secondary | ICD-10-CM

## 2017-06-25 HISTORY — DX: Pain in right knee: M25.561

## 2017-06-25 HISTORY — DX: Unspecified eustachian tube disorder, right ear: H69.91

## 2017-06-25 HISTORY — DX: Other specified disorders of eustachian tube, right ear: H69.81

## 2017-06-25 LAB — LIPID PANEL
CHOL/HDL RATIO: 6
Cholesterol: 186 mg/dL (ref 0–200)
HDL: 33.4 mg/dL — ABNORMAL LOW (ref >39.00)
NONHDL: 152.59
Triglycerides: 265 mg/dL — ABNORMAL HIGH (ref 0.0–149.0)
VLDL: 53 mg/dL — ABNORMAL HIGH (ref 0.0–40.0)

## 2017-06-25 LAB — LDL CHOLESTEROL, DIRECT: Direct LDL: 98 mg/dL

## 2017-06-25 LAB — PSA: PSA: 2.2 ng/mL (ref 0.10–4.00)

## 2017-06-25 LAB — TSH: TSH: 2.29 u[IU]/mL (ref 0.35–4.50)

## 2017-06-25 LAB — HEMOGLOBIN A1C: HEMOGLOBIN A1C: 5.7 % (ref 4.6–6.5)

## 2017-06-25 MED ORDER — TRIAMCINOLONE ACETONIDE 55 MCG/ACT NA AERO: 2.0000 | INHALATION_SPRAY | Freq: Every day | NASAL | 12 refills | Status: DC

## 2017-06-25 MED ORDER — FEXOFENADINE HCL 180 MG PO TABS: 180.0000 mg | ORAL_TABLET | Freq: Every day | ORAL | 3 refills | Status: DC

## 2017-06-25 NOTE — Progress Notes (Signed)
Subjective:    Patient ID: Justin Woodard, male    DOB: [birth year], 62 y.o.   MRN: [id number]  HPI  Here for wellness and f/u;  Overall doing ok;  Pt denies Chest pain, worsening SOB, DOE, wheezing, orthopnea, PND, worsening LE edema, palpitations, dizziness or syncope.  Pt denies neurological change such as new headache, facial or extremity weakness.  Pt denies polydipsia, polyuria, or low sugar symptoms. Pt states overall good compliance with treatment and medications, good tolerability, and has been trying to follow appropriate diet.  Pt denies worsening depressive symptoms, suicidal ideation or panic. No fever, night sweats, wt loss, loss of appetite, or other constitutional symptoms.  Pt states good ability with ADL's, has low fall risk, home safety reviewed and adequate, no other significant changes in hearing or vision, and not active with exercise.  Declines colonoscopy or cologuard and Tdap.  OK for flu shot.  Also, Does have several wks ongoing nasal allergy symptoms with clearish congestion, itch and sneezing, without fever, pain, ST, cough, swelling or wheezing.  Also with 1 wk right ear fullness with popping and crackling without reduced hearing or dizziness  Also with recurring right knee pain worse x 3 mo without swelling but mild to mod persistent, intermittent, worse to walk, better to sit, without giveaways or falls.   Past Medical History:  Diagnosis Date  . A-fib (HCC)    post PE only  . Anxiety   . DVT (deep venous thrombosis) (HCC)    2000  . Erectile dysfunction 05/28/2011  . GERD (gastroesophageal reflux disease)   . Hypertension   . Pulmonary embolism (HCC)   . Symptomatic PVCs    Past Surgical History:  Procedure Laterality Date  . TONSILLECTOMY    . VEIN SURGERY     RLE saphenous vein stripping    reports that he has quit smoking. he has never used smokeless tobacco. He reports that he does not drink alcohol or use drugs. family history includes Diabetes in his  unknown relative; Heart attack in his father. Allergies  Allergen Reactions  . Oxycodone Other (See Comments)    "Just made me feel bad"   Current Outpatient Medications on File Prior to Visit  Medication Sig Dispense Refill  . metoprolol succinate (TOPROL-XL) 25 MG 24 hr tablet TAKE 1 TABLET(25 MG) BY MOUTH DAILY (Patient taking differently: Take 25 mg by mouth daily. ) 90 tablet 3  . omeprazole (PRILOSEC) 40 MG capsule Take 1 tablet (40 mg) by mouth on Monday, Wednesday, and Friday    . VENTOLIN HFA 108 (90 Base) MCG/ACT inhaler Inhale 1 puff into the lungs every 6 (six) hours as needed for wheezing.  1  . warfarin (COUMADIN) 4 MG tablet TAKE AS DIRECTED BY ANTICOAGULATION CLINIC (Patient taking differently: Take 4-8 mg by mouth See admin instructions. 4mg  by mouth once daily every Wednesday, 8mg  by mouth once daily all other days) 180 tablet 1   No current facility-administered medications on file prior to visit.    Review of Systems Constitutional: Negative for other unusual diaphoresis, sweats, appetite or weight changes HENT: Negative for other worsening hearing loss, ear pain, facial swelling, mouth sores or neck stiffness.   Eyes: Negative for other worsening pain, redness or other visual disturbance.  Respiratory: Negative for other stridor or swelling Cardiovascular: Negative for other palpitations or other chest pain  Gastrointestinal: Negative for worsening diarrhea or loose stools, blood in stool, distention or other pain Genitourinary: Negative for hematuria, flank pain  or other change in urine volume.  Musculoskeletal: Negative for myalgias or other joint swelling.  Skin: Negative for other color change, or other wound or worsening drainage.  Neurological: Negative for other syncope or numbness. Hematological: Negative for other adenopathy or swelling Psychiatric/Behavioral: Negative for hallucinations, other worsening agitation, SI, self-injury, or new decreased  concentration All other system neg per pt    Objective:   Physical Exam BP 128/88   Pulse 72   Temp 98.4 F (36.9 C) (Oral)   Ht 6' (1.829 m)   Wt 298 lb (135.2 kg)   SpO2 96%   BMI 40.42 kg/m  VS noted, not ill appearing Constitutional: Pt is oriented to person, place, and time. Appears well-developed and well-nourished, in no significant distress and comfortable Head: Normocephalic and atraumatic  Eyes: Conjunctivae and EOM are normal. Pupils are equal, round, and reactive to light Bilat tm's with mild erythema.  Max sinus areas non tender.  Pharynx with mild erythema, no exudate Right Ear: External ear normal without discharge Left Ear: External ear normal without discharge Nose: Nose without discharge or deformity Mouth/Throat: Oropharynx is without other ulcerations and moist  Neck: Normal range of motion. Neck supple. No JVD present. No tracheal deviation present or significant neck LA or mass Cardiovascular: Normal rate, regular rhythm, normal heart sounds and intact distal pulses.   Pulmonary/Chest: WOB normal and breath sounds without rales or wheezing  Abdominal: Soft. Bowel sounds are normal. NT. No HSM  Musculoskeletal: Normal range of motion. Exhibits no edema except for right knee bony degenerative changes and trace effusion, reduced ROM Lymphadenopathy: Has no other cervical adenopathy.  Neurological: Pt is alert and oriented to person, place, and time. Pt has normal reflexes. No cranial nerve deficit. Motor grossly intact, Gait intact Skin: Skin is warm and dry. No rash noted or new ulcerations Psychiatric:  Has normal mood and affect. Behavior is normal without agitation No other exam findings    Assessment & Plan:

## 2017-06-25 NOTE — Patient Instructions (Addendum)
You had the flu shot today  Please take all new medication as prescribed - the allegra and nasacort for allergies  You can also take Mucinex (or it's generic off brand) for ear congestion, and tylenol as needed for pain.  Please consider seeing Sports Medicine in this office for the right knee  Please continue all other medications as before, and refills have been done if requested.  Please have the pharmacy call with any other refills you may need.  Please continue your efforts at being more active, low cholesterol diet, and weight control.  You are otherwise up to date with prevention measures today, but please call if you change your mind about the colonoscopy  Please keep your appointments with your specialists as you may have planned  Please go to the LAB in the Basement (turn left off the elevator) for the tests to be done today  You will be contacted by phone if any changes need to be made immediately.  Otherwise, you will receive a letter about your results with an explanation, but please check with MyChart first.  Please remember to sign up for MyChart if you have not done so, as this will be important to you in the future with finding out test results, communicating by private email, and scheduling acute appointments online when needed.  Please return in 1 year for your yearly visit, or sooner if needed, with Lab testing done 3-5 days before  OK to cancel the May 1 appointment

## 2017-06-26 LAB — HEPATITIS C ANTIBODY
HEP C AB: NONREACTIVE
SIGNAL TO CUT-OFF: 0.07 (ref <1.00)

## 2017-06-26 LAB — HIV ANTIBODY (ROUTINE TESTING W REFLEX): HIV 1&2 Ab, 4th Generation: NONREACTIVE

## 2017-06-29 NOTE — Assessment & Plan Note (Addendum)
Mild to mod, for allegra and nasacort asd,  to f/u any worsening symptoms or concerns  In addition to the time spent performing CPE, I spent an additional 25 minutes face to face,in which greater than 50% of this time was spent in counseling and coordination of care for patient's acute illness as documented, including the differential dx, treatment, further evaluation and other management of allergic rhinitis, eustachian valve dysfxn, right knee pain, HTN, and hyperglycemia

## 2017-06-29 NOTE — Assessment & Plan Note (Signed)

## 2017-06-29 NOTE — Assessment & Plan Note (Signed)
stable overall by history and exam, recent data reviewed with pt, and pt to continue medical treatment as before,  to f/u any worsening symptoms or concerns Lab Results  Component Value Date   HGBA1C 5.7 06/25/2017

## 2017-06-29 NOTE — Assessment & Plan Note (Signed)
Chronic persistent worsening, for sport medicine referral

## 2017-06-29 NOTE — Assessment & Plan Note (Signed)
stable overall by history and exam, recent data reviewed with pt, and pt to continue medical treatment as before,  to f/u any worsening symptoms or concerns BP Readings from Last 3 Encounters:  06/25/17 128/88  06/20/17 (!) 139/91  02/07/17 138/78

## 2017-06-29 NOTE — Assessment & Plan Note (Signed)
Also for mucinex otc prn,  to f/u any worsening symptoms or concerns  

## 2017-07-04 ENCOUNTER — Ambulatory Visit: Payer: 59 | Admitting: General Practice

## 2017-07-04 DIAGNOSIS — Z8672 Personal history of thrombophlebitis: Secondary | ICD-10-CM | POA: Diagnosis not present

## 2017-07-04 LAB — POCT INR: INR: 2.5

## 2017-07-04 NOTE — Patient Instructions (Addendum)
Pre visit review using our clinic review tool, if applicable. No additional management support is needed unless otherwise documented below in the visit note.  Continue to take 2 tablets daily except 1 tablet on Wednesdays.  Re-check in 4 weeks.    

## 2017-08-01 ENCOUNTER — Ambulatory Visit: Payer: 59 | Admitting: General Practice

## 2017-08-01 DIAGNOSIS — Z8672 Personal history of thrombophlebitis: Secondary | ICD-10-CM | POA: Diagnosis not present

## 2017-08-01 LAB — POCT INR: INR: 2.1

## 2017-08-01 NOTE — Patient Instructions (Addendum)
Pre visit review using our clinic review tool, if applicable. No additional management support is needed unless otherwise documented below in the visit note.  Continue to take 2 tablets daily except 1 tablet on Wednesdays.  Re-check in 4 weeks.    

## 2017-08-03 NOTE — Progress Notes (Signed)
Agree with management.  Emerson Barretto J Harce Volden, MD  

## 2017-08-29 ENCOUNTER — Ambulatory Visit: Payer: 59 | Admitting: General Practice

## 2017-08-29 DIAGNOSIS — Z8672 Personal history of thrombophlebitis: Secondary | ICD-10-CM | POA: Diagnosis not present

## 2017-08-29 LAB — POCT INR: INR: 2.7

## 2017-08-29 NOTE — Patient Instructions (Addendum)
Pre visit review using our clinic review tool, if applicable. No additional management support is needed unless otherwise documented below in the visit note.  Continue to take 2 tablets daily except 1 tablet on Wednesdays.  Re-check in 4 weeks.    

## 2017-09-09 ENCOUNTER — Other Ambulatory Visit: Payer: Self-pay | Admitting: Internal Medicine

## 2017-09-17 ENCOUNTER — Encounter: Payer: 59 | Admitting: Internal Medicine

## 2017-09-23 ENCOUNTER — Other Ambulatory Visit: Payer: Self-pay | Admitting: Internal Medicine

## 2017-09-24 ENCOUNTER — Other Ambulatory Visit: Payer: Self-pay | Admitting: General Practice

## 2017-09-24 MED ORDER — WARFARIN SODIUM 4 MG PO TABS: ORAL_TABLET | ORAL | 1 refills | Status: DC

## 2017-09-26 ENCOUNTER — Ambulatory Visit: Payer: 59 | Admitting: General Practice

## 2017-09-26 DIAGNOSIS — Z8672 Personal history of thrombophlebitis: Secondary | ICD-10-CM

## 2017-09-26 LAB — POCT INR: INR: 2.3

## 2017-09-26 NOTE — Patient Instructions (Signed)
Pre visit review using our clinic review tool, if applicable. No additional management support is needed unless otherwise documented below in the visit note.  Continue to take 2 tablets daily except 1 tablet on Wednesdays.  Re-check in 4 weeks.    

## 2017-10-24 ENCOUNTER — Ambulatory Visit: Payer: 59 | Admitting: General Practice

## 2017-10-24 DIAGNOSIS — Z8672 Personal history of thrombophlebitis: Secondary | ICD-10-CM | POA: Diagnosis not present

## 2017-10-24 LAB — POCT INR: INR: 2.4 (ref 2.0–3.0)

## 2017-10-24 NOTE — Patient Instructions (Addendum)
Pre visit review using our clinic review tool, if applicable. No additional management support is needed unless otherwise documented below in the visit note.  Continue to take 2 tablets daily except 1 tablet on Wednesdays.  Re-check in 4 weeks.    

## 2017-11-28 ENCOUNTER — Ambulatory Visit: Payer: 59

## 2017-12-05 ENCOUNTER — Ambulatory Visit: Payer: 59 | Admitting: General Practice

## 2017-12-05 DIAGNOSIS — Z8672 Personal history of thrombophlebitis: Secondary | ICD-10-CM

## 2017-12-05 LAB — POCT INR: INR: 2.1 (ref 2.0–3.0)

## 2017-12-05 NOTE — Patient Instructions (Addendum)
Pre visit review using our clinic review tool, if applicable. No additional management support is needed unless otherwise documented below in the visit note.  Continue to take 2 tablets daily except 1 tablet on Wednesdays.  Re-check in 4 weeks.    

## 2018-01-09 ENCOUNTER — Ambulatory Visit: Payer: 59 | Admitting: General Practice

## 2018-01-09 DIAGNOSIS — Z8672 Personal history of thrombophlebitis: Secondary | ICD-10-CM | POA: Diagnosis not present

## 2018-01-09 LAB — POCT INR: INR: 2.3 (ref 2.0–3.0)

## 2018-01-09 NOTE — Patient Instructions (Addendum)
Pre visit review using our clinic review tool, if applicable. No additional management support is needed unless otherwise documented below in the visit note.  Continue to take 2 tablets daily except 1 tablet on Wednesdays.  Re-check in 4 weeks.    

## 2018-01-26 ENCOUNTER — Ambulatory Visit (INDEPENDENT_AMBULATORY_CARE_PROVIDER_SITE_OTHER)
Admission: RE | Admit: 2018-01-26 | Discharge: 2018-01-26 | Disposition: A | Discharge status: Home / Self Care | Payer: 59 | Source: Ambulatory Visit | Attending: Internal Medicine | Admitting: Internal Medicine

## 2018-01-26 ENCOUNTER — Ambulatory Visit: Payer: 59 | Admitting: Internal Medicine

## 2018-01-26 ENCOUNTER — Encounter: Payer: Self-pay | Admitting: Internal Medicine

## 2018-01-26 VITALS — BP 152/88 | HR 69 | Temp 98.5°F | Resp 16 | Wt 297.0 lb

## 2018-01-26 DIAGNOSIS — R739 Hyperglycemia, unspecified: Secondary | ICD-10-CM | POA: Diagnosis not present

## 2018-01-26 DIAGNOSIS — K219 Gastro-esophageal reflux disease without esophagitis: Secondary | ICD-10-CM

## 2018-01-26 DIAGNOSIS — I10 Essential (primary) hypertension: Secondary | ICD-10-CM

## 2018-01-26 DIAGNOSIS — R05 Cough: Secondary | ICD-10-CM | POA: Diagnosis not present

## 2018-01-26 DIAGNOSIS — R059 Cough, unspecified: Secondary | ICD-10-CM

## 2018-01-26 DIAGNOSIS — Z0000 Encounter for general adult medical examination without abnormal findings: Secondary | ICD-10-CM

## 2018-01-26 MED ORDER — HYDROCODONE-HOMATROPINE 5-1.5 MG/5ML PO SYRP: 5.0000 mL | ORAL_SOLUTION | Freq: Four times a day (QID) | ORAL | 0 refills | Status: AC | PRN

## 2018-01-26 MED ORDER — CETIRIZINE HCL 10 MG PO TABS: 10.0000 mg | ORAL_TABLET | Freq: Every day | ORAL | 11 refills | Status: DC

## 2018-01-26 MED ORDER — RANITIDINE HCL 150 MG PO TABS: 150.0000 mg | ORAL_TABLET | Freq: Two times a day (BID) | ORAL | 11 refills | Status: DC

## 2018-01-26 MED ORDER — AZITHROMYCIN 250 MG PO TABS: ORAL_TABLET | ORAL | 1 refills | Status: DC

## 2018-01-26 NOTE — Assessment & Plan Note (Signed)
stable overall by history and exam, recent data reviewed with pt, and pt to continue medical treatment as before,  to f/u any worsening symptoms or concerns  

## 2018-01-26 NOTE — Assessment & Plan Note (Addendum)
Mild to mod, c/w likely realated to sinusitis, for antibx course, cough med prn, csr today,  to f/u any worsening symptoms or concerns

## 2018-01-26 NOTE — Assessment & Plan Note (Signed)
Mild to mod, for zantac asd,  f/u any worsening symptoms or concerns

## 2018-01-26 NOTE — Patient Instructions (Addendum)
Please take all new medication as prescribed - the antibiotic, cough medicine as needed, the zyrtec for possible allergies, and Zantac for reflux  Please continue all other medications as before, and refills have been done if requested.  Please have the pharmacy call with any other refills you may need.  Please keep your appointments with your specialists as you may have planned  Please go to the XRAY Department in the Basement (go straight as you get off the elevator) for the x-ray testing  You will be contacted by phone if any changes need to be made immediately.  Otherwise, you will receive a letter about your results with an explanation, but please check with MyChart first.  Please remember to sign up for MyChart if you have not done so, as this will be important to you in the future with finding out test results, communicating by private email, and scheduling acute appointments online when needed.  Please return in 6 months, or sooner if needed, with Lab testing done 3-5 days before

## 2018-01-26 NOTE — Assessment & Plan Note (Signed)
Mild elevated likely reactive, o/w stable overall by history and exam, recent data reviewed with pt, and pt to continue medical treatment as before,  to f/u any worsening symptoms or concerns BP Readings from Last 3 Encounters:  01/26/18 (!) 152/88  06/25/17 128/88  06/20/17 (!) 139/91

## 2018-01-26 NOTE — Progress Notes (Signed)
Subjective:    Patient ID: Justin Woodard, male    DOB: 04-06-1956, 62 y.o.   MRN: [id number]  HPI    Here with 2-3 days acute onset fever, facial pain, pressure, headache, general weakness and malaise, and greenish d/c, with mild ST and cough, but pt denies chest pain, wheezing, increased sob or doe, orthopnea, PND, increased LE swelling, palpitations, dizziness or syncope.  Also, Does have several wks ongoing nasal allergy symptoms with clearish congestion, itch and sneezing, without fever, pain, ST, cough, swelling or wheezing.  Also, has had mild worsening reflux, but no abd pain, dysphagia, n/v, bowel change or blood. Past Medical History:  Diagnosis Date  . A-fib (HCC)    post PE only  . Anxiety   . DVT (deep venous thrombosis) (HCC)    2000  . Erectile dysfunction 05/28/2011  . GERD (gastroesophageal reflux disease)   . Hypertension   . Pulmonary embolism (HCC)   . Symptomatic PVCs    Past Surgical History:  Procedure Laterality Date  . TONSILLECTOMY    . VEIN SURGERY     RLE saphenous vein stripping    reports that he has quit smoking. He has never used smokeless tobacco. He reports that he does not drink alcohol or use drugs. family history includes Diabetes in his unknown relative; Heart attack in his father. Allergies  Allergen Reactions  . Oxycodone Other (See Comments)    "Just made me feel bad"   Current Outpatient Medications on File Prior to Visit  Medication Sig Dispense Refill  . fexofenadine (ALLEGRA) 180 MG tablet Take 1 tablet (180 mg total) by mouth daily. 90 tablet 3  . metoprolol succinate (TOPROL-XL) 25 MG 24 hr tablet TAKE 1 TABLET(25 MG) BY MOUTH DAILY 90 tablet 3  . omeprazole (PRILOSEC) 40 MG capsule Take 1 tablet (40 mg) by mouth on Monday, Wednesday, and Friday    . triamcinolone (NASACORT) 55 MCG/ACT AERO nasal inhaler Place 2 sprays into the nose daily. 1 Inhaler 12  . VENTOLIN HFA 108 (90 Base) MCG/ACT inhaler Inhale 1 puff into the lungs every 6  (six) hours as needed for wheezing.  1  . warfarin (COUMADIN) 4 MG tablet Take 2 tablets daily or TAKE AS DIRECTED BY ANTICOAGULATION CLINIC 180 tablet 1   No current facility-administered medications on file prior to visit.    Review of Systems  Constitutional: Negative for other unusual diaphoresis or sweats HENT: Negative for ear discharge or swelling Eyes: Negative for other worsening visual disturbances Respiratory: Negative for stridor or other swelling  Gastrointestinal: Negative for worsening distension or other blood Genitourinary: Negative for retention or other urinary change Musculoskeletal: Negative for other MSK pain or swelling Skin: Negative for color change or other new lesions Neurological: Negative for worsening tremors and other numbness  Psychiatric/Behavioral: Negative for worsening agitation or other fatigue All other system neg per pt    Objective:   Physical Exam BP (!) 152/88   Pulse 69   Temp 98.5 F (36.9 C) (Oral)   Resp 16   Wt 297 lb (134.7 kg)   SpO2 97%   BMI 40.28 kg/m  VS noted,  Mild ill Constitutional: Pt appears in NAD HENT: Head: NCAT.  Right Ear: External ear normal.  Left Ear: External ear normal.  Eyes: . Pupils are equal, round, and reactive to light. Conjunctivae and EOM are normal Bilat tm's with mild erythema.  Max sinus areas mild tender.  Pharynx with mild erythema, no exudate  Nose: without d/c or deformity Neck: Neck supple. Gross normal ROM Cardiovascular: Normal rate and regular rhythm.   Pulmonary/Chest: Effort normal and breath sounds without rales or wheezing.  Abd:  Soft, NT, ND, + BS, no organomegaly Neurological: Pt is alert. At baseline orientation, motor grossly intact Skin: Skin is warm. No rashes, other new lesions, no LE edema Psychiatric: Pt behavior is normal without agitation  No other exam findings Lab Results  Component Value Date   WBC 4.9 06/20/2017   HGB 13.9 06/20/2017   HCT 41.3 06/20/2017   PLT  189 06/20/2017   GLUCOSE 104 (H) 06/20/2017   CHOL 186 06/25/2017   TRIG 265.0 (H) 06/25/2017   HDL 33.40 (L) 06/25/2017   LDLDIRECT 98.0 06/25/2017   ALT 20 09/13/2016   AST 17 09/13/2016   NA 138 06/20/2017   K 4.1 06/20/2017   CL 103 06/20/2017   CREATININE 1.15 06/20/2017   BUN 11 06/20/2017   CO2 22 06/20/2017   TSH 2.29 06/25/2017   PSA 2.20 06/25/2017   INR 2.3 01/09/2018   HGBA1C 5.7 06/25/2017       Assessment & Plan:

## 2018-02-06 ENCOUNTER — Ambulatory Visit: Payer: 59 | Admitting: General Practice

## 2018-02-06 DIAGNOSIS — Z8672 Personal history of thrombophlebitis: Secondary | ICD-10-CM | POA: Diagnosis not present

## 2018-02-06 LAB — POCT INR: INR: 2.3 (ref 2.0–3.0)

## 2018-02-06 NOTE — Patient Instructions (Addendum)
Pre visit review using our clinic review tool, if applicable. No additional management support is needed unless otherwise documented below in the visit note.  Continue to take 2 tablets daily except 1 tablet on Wednesdays.   Re-check in 6 weeks. 

## 2018-03-12 ENCOUNTER — Encounter: Payer: Self-pay | Admitting: Internal Medicine

## 2018-03-12 MED ORDER — FAMOTIDINE 40 MG PO TABS: 40.0000 mg | ORAL_TABLET | Freq: Two times a day (BID) | ORAL | 5 refills | Status: DC

## 2018-03-12 NOTE — Addendum Note (Signed)
Addended by: Pincus Sanes on: 03/12/2018 12:36 PM   Modules accepted: Orders

## 2018-03-20 ENCOUNTER — Ambulatory Visit: Payer: 59 | Admitting: General Practice

## 2018-03-20 DIAGNOSIS — Z8672 Personal history of thrombophlebitis: Secondary | ICD-10-CM | POA: Diagnosis not present

## 2018-03-20 LAB — POCT INR: INR: 2.2 (ref 2.0–3.0)

## 2018-03-20 NOTE — Progress Notes (Signed)
Agree with management.  Ardyce Heyer J Buryl Bamber, MD  

## 2018-03-20 NOTE — Patient Instructions (Addendum)
Pre visit review using our clinic review tool, if applicable. No additional management support is needed unless otherwise documented below in the visit note.  Continue to take 2 tablets daily except 1 tablet on Wednesdays.   Re-check in 6 weeks. 

## 2018-04-24 ENCOUNTER — Ambulatory Visit: Payer: 59 | Admitting: General Practice

## 2018-04-24 DIAGNOSIS — Z8672 Personal history of thrombophlebitis: Secondary | ICD-10-CM | POA: Diagnosis not present

## 2018-04-24 LAB — POCT INR: INR: 2.3 (ref 2.0–3.0)

## 2018-04-24 NOTE — Patient Instructions (Addendum)
Pre visit review using our clinic review tool, if applicable. No additional management support is needed unless otherwise documented below in the visit note.  Continue to take 2 tablets daily except 1 tablet on Wednesdays.   Re-check in 6 weeks. 

## 2018-06-03 ENCOUNTER — Other Ambulatory Visit: Payer: Self-pay | Admitting: *Deleted

## 2018-06-03 MED ORDER — OMEPRAZOLE 40 MG PO CPDR: DELAYED_RELEASE_CAPSULE | ORAL | 0 refills | Status: DC

## 2018-06-05 ENCOUNTER — Ambulatory Visit: Payer: 59 | Admitting: General Practice

## 2018-06-05 DIAGNOSIS — Z8672 Personal history of thrombophlebitis: Secondary | ICD-10-CM | POA: Diagnosis not present

## 2018-06-05 DIAGNOSIS — Z23 Encounter for immunization: Secondary | ICD-10-CM | POA: Diagnosis not present

## 2018-06-05 LAB — POCT INR: INR: 2 (ref 2.0–3.0)

## 2018-06-05 NOTE — Patient Instructions (Addendum)
Pre visit review using our clinic review tool, if applicable. No additional management support is needed unless otherwise documented below in the visit note.  Take 12 mg today (3 tablets) and then continue to take 2 tablets daily except 1 tablet on Wednesdays.  Re-check in 4 weeks.

## 2018-07-03 ENCOUNTER — Ambulatory Visit: Payer: 59 | Admitting: General Practice

## 2018-07-03 DIAGNOSIS — Z8672 Personal history of thrombophlebitis: Secondary | ICD-10-CM | POA: Diagnosis not present

## 2018-07-03 LAB — POCT INR: INR: 2.2 (ref 2.0–3.0)

## 2018-07-03 NOTE — Patient Instructions (Addendum)
Pre visit review using our clinic review tool, if applicable. No additional management support is needed unless otherwise documented below in the visit note.  Continue to take 2 tablets daily except 1 tablet on Wednesdays.  Re-check in 4 weeks.    

## 2018-07-23 ENCOUNTER — Telehealth: Payer: Self-pay

## 2018-07-23 MED ORDER — OMEPRAZOLE 40 MG PO CPDR: DELAYED_RELEASE_CAPSULE | ORAL | 0 refills | Status: DC

## 2018-07-23 NOTE — Telephone Encounter (Signed)
Omeprazole refilled and note put on rx to call for appointment.

## 2018-07-31 ENCOUNTER — Ambulatory Visit: Payer: 59 | Admitting: General Practice

## 2018-07-31 ENCOUNTER — Other Ambulatory Visit: Payer: Self-pay

## 2018-07-31 DIAGNOSIS — Z8672 Personal history of thrombophlebitis: Secondary | ICD-10-CM | POA: Diagnosis not present

## 2018-07-31 LAB — POCT INR: INR: 2.2 (ref 2.0–3.0)

## 2018-07-31 NOTE — Patient Instructions (Addendum)
Pre visit review using our clinic review tool, if applicable. No additional management support is needed unless otherwise documented below in the visit note.  Continue to take 2 tablets daily except 1 tablet on Wednesdays.   Re-check in 6 weeks. 

## 2018-08-05 ENCOUNTER — Other Ambulatory Visit (INDEPENDENT_AMBULATORY_CARE_PROVIDER_SITE_OTHER): Payer: 59

## 2018-08-05 ENCOUNTER — Other Ambulatory Visit: Payer: Self-pay

## 2018-08-05 ENCOUNTER — Encounter: Payer: Self-pay | Admitting: Internal Medicine

## 2018-08-05 ENCOUNTER — Ambulatory Visit (INDEPENDENT_AMBULATORY_CARE_PROVIDER_SITE_OTHER): Payer: 59 | Admitting: Internal Medicine

## 2018-08-05 VITALS — BP 124/82 | HR 73 | Temp 98.1°F | Ht 72.0 in | Wt 302.0 lb

## 2018-08-05 DIAGNOSIS — Z125 Encounter for screening for malignant neoplasm of prostate: Secondary | ICD-10-CM

## 2018-08-05 DIAGNOSIS — Z0000 Encounter for general adult medical examination without abnormal findings: Secondary | ICD-10-CM | POA: Diagnosis not present

## 2018-08-05 DIAGNOSIS — R739 Hyperglycemia, unspecified: Secondary | ICD-10-CM

## 2018-08-05 LAB — CBC WITH DIFFERENTIAL/PLATELET
Basophils Absolute: 0 10*3/uL (ref 0.0–0.1)
Basophils Relative: 0.9 % (ref 0.0–3.0)
EOS ABS: 0.2 10*3/uL (ref 0.0–0.7)
Eosinophils Relative: 3.2 % (ref 0.0–5.0)
HCT: 40.1 % (ref 39.0–52.0)
Hemoglobin: 13.7 g/dL (ref 13.0–17.0)
Lymphocytes Relative: 36.7 % (ref 12.0–46.0)
Lymphs Abs: 2 10*3/uL (ref 0.7–4.0)
MCHC: 34.2 g/dL (ref 30.0–36.0)
MCV: 85.5 fl (ref 78.0–100.0)
Monocytes Absolute: 0.4 10*3/uL (ref 0.1–1.0)
Monocytes Relative: 7.1 % (ref 3.0–12.0)
Neutro Abs: 2.8 10*3/uL (ref 1.4–7.7)
Neutrophils Relative %: 52.1 % (ref 43.0–77.0)
Platelets: 205 10*3/uL (ref 150.0–400.0)
RBC: 4.69 Mil/uL (ref 4.22–5.81)
RDW: 13.4 % (ref 11.5–15.5)
WBC: 5.4 10*3/uL (ref 4.0–10.5)

## 2018-08-05 LAB — LIPID PANEL
Cholesterol: 200 mg/dL (ref 0–200)
HDL: 35.6 mg/dL — ABNORMAL LOW (ref >39.00)
NonHDL: 164.22
Total CHOL/HDL Ratio: 6
Triglycerides: 233 mg/dL — ABNORMAL HIGH (ref 0.0–149.0)
VLDL: 46.6 mg/dL — ABNORMAL HIGH (ref 0.0–40.0)

## 2018-08-05 LAB — URINALYSIS, ROUTINE W REFLEX MICROSCOPIC
Bilirubin Urine: NEGATIVE
Hgb urine dipstick: NEGATIVE
Ketones, ur: NEGATIVE
Leukocytes,Ua: NEGATIVE
Nitrite: NEGATIVE
RBC / HPF: NONE SEEN (ref 0–?)
Specific Gravity, Urine: 1.02 (ref 1.000–1.030)
Total Protein, Urine: NEGATIVE
Urine Glucose: NEGATIVE
Urobilinogen, UA: 0.2 (ref 0.0–1.0)
pH: 7 (ref 5.0–8.0)

## 2018-08-05 LAB — BASIC METABOLIC PANEL
BUN: 14 mg/dL (ref 6–23)
CALCIUM: 9 mg/dL (ref 8.4–10.5)
CO2: 32 mEq/L (ref 19–32)
Chloride: 103 mEq/L (ref 96–112)
Creatinine, Ser: 1.1 mg/dL (ref 0.40–1.50)
GFR: 67.21 mL/min (ref >60.00)
Glucose, Bld: 99 mg/dL (ref 70–99)
Potassium: 4.1 mEq/L (ref 3.5–5.1)
Sodium: 139 mEq/L (ref 135–145)

## 2018-08-05 LAB — HEPATIC FUNCTION PANEL
ALT: 18 U/L (ref 0–53)
AST: 15 U/L (ref 0–37)
Albumin: 4 g/dL (ref 3.5–5.2)
Alkaline Phosphatase: 59 U/L (ref 39–117)
BILIRUBIN DIRECT: 0.2 mg/dL (ref 0.0–0.3)
Total Bilirubin: 1.1 mg/dL (ref 0.2–1.2)
Total Protein: 6.6 g/dL (ref 6.0–8.3)

## 2018-08-05 LAB — PSA: PSA: 1.6 ng/mL (ref 0.10–4.00)

## 2018-08-05 LAB — LDL CHOLESTEROL, DIRECT: LDL DIRECT: 105 mg/dL

## 2018-08-05 LAB — TSH: TSH: 3.39 u[IU]/mL (ref 0.35–4.50)

## 2018-08-05 LAB — HEMOGLOBIN A1C: Hgb A1c MFr Bld: 5.6 % (ref 4.6–6.5)

## 2018-08-05 NOTE — Assessment & Plan Note (Signed)

## 2018-08-05 NOTE — Patient Instructions (Signed)

## 2018-08-05 NOTE — Assessment & Plan Note (Signed)
stable overall by history and exam, recent data reviewed with pt, and pt to continue medical treatment as before,  to f/u any worsening symptoms or concerns  

## 2018-08-05 NOTE — Progress Notes (Signed)
Subjective:    Patient ID: Justin Woodard, male    DOB: Sep 30, 1955, 63 y.o.   MRN: [id number]  HPI  Here for wellness and f/u;  Overall doing ok;  Pt denies Chest pain, worsening SOB, DOE, wheezing, orthopnea, PND, worsening LE edema, palpitations, dizziness or syncope.  Pt denies neurological change such as new headache, facial or extremity weakness.  Pt denies polydipsia, polyuria, or low sugar symptoms. Pt states overall good compliance with treatment and medications, good tolerability, and has been trying to follow appropriate diet.  Pt denies worsening depressive symptoms, suicidal ideation or panic. No fever, night sweats, wt loss, loss of appetite, or other constitutional symptoms.  Pt states good ability with ADL's, has low fall risk, home safety reviewed and adequate, no other significant changes in hearing or vision, and only occasionally active with exercise.  No new complaints except has ongoing chronic neck and right arm pain, asks for handicap parking applic Past Medical History:  Diagnosis Date   A-fib [facility])    post PE only   Anxiety    DVT (deep venous thrombosis) (HCC)    2000   Erectile dysfunction 05/28/2011   GERD (gastroesophageal reflux disease)    Hypertension    Pulmonary embolism (HCC)    Symptomatic PVCs    Past Surgical History:  Procedure Laterality Date   TONSILLECTOMY     VEIN SURGERY     RLE saphenous vein stripping    reports that he has quit smoking. He has never used smokeless tobacco. He reports that he does not drink alcohol or use drugs. family history includes Diabetes in his unknown relative; Heart attack in his father. Allergies  Allergen Reactions   Oxycodone Other (See Comments)    "Just made me feel bad"   Current Outpatient Medications on File Prior to Visit  Medication Sig Dispense Refill   cetirizine (ZYRTEC) 10 MG tablet Take 1 tablet (10 mg total) by mouth daily. 30 tablet 11   famotidine (PEPCID) 40 MG tablet Take 1 tablet  (40 mg total) by mouth 2 (two) times daily. 60 tablet 5   metoprolol succinate (TOPROL-XL) 25 MG 24 hr tablet TAKE 1 TABLET(25 MG) BY MOUTH DAILY 90 tablet 3   omeprazole (PRILOSEC) 40 MG capsule Take 1 capsule 40 mg by mouth daily before breakfast. 30 capsule 0   triamcinolone (NASACORT) 55 MCG/ACT AERO nasal inhaler Place 2 sprays into the nose daily. 1 Inhaler 12   VENTOLIN HFA 108 (90 Base) MCG/ACT inhaler Inhale 1 puff into the lungs every 6 (six) hours as needed for wheezing.  1   warfarin (COUMADIN) 4 MG tablet Take 2 tablets daily or TAKE AS DIRECTED BY ANTICOAGULATION CLINIC 180 tablet 1   fexofenadine (ALLEGRA) 180 MG tablet Take 1 tablet (180 mg total) by mouth daily. 90 tablet 3   No current facility-administered medications on file prior to visit.    Review of Systems Constitutional: Negative for other unusual diaphoresis, sweats, appetite or weight changes HENT: Negative for other worsening hearing loss, ear pain, facial swelling, mouth sores or neck stiffness.   Eyes: Negative for other worsening pain, redness or other visual disturbance.  Respiratory: Negative for other stridor or swelling Cardiovascular: Negative for other palpitations or other chest pain  Gastrointestinal: Negative for worsening diarrhea or loose stools, blood in stool, distention or other pain Genitourinary: Negative for hematuria, flank pain or other change in urine volume.  Musculoskeletal: Negative for myalgias or other joint swelling.  Skin: Negative  for other color change, or other wound or worsening drainage.  Neurological: Negative for other syncope or numbness. Hematological: Negative for other adenopathy or swelling Psychiatric/Behavioral: Negative for hallucinations, other worsening agitation, SI, self-injury, or new decreased concentration All other system neg per pt    Objective:   Physical Exam BP 124/82    Pulse 73    Temp 98.1 F (36.7 C) (Oral)    Ht 6' (1.829 m)    Wt (!) 302 lb  (137 kg)    SpO2 96%    BMI 40.96 kg/m  VS noted,  Constitutional: Pt is oriented to person, place, and time. Appears well-developed and well-nourished, in no significant distress and comfortable Head: Normocephalic and atraumatic  Eyes: Conjunctivae and EOM are normal. Pupils are equal, round, and reactive to light Right Ear: External ear normal without discharge Left Ear: External ear normal without discharge Nose: Nose without discharge or deformity Mouth/Throat: Oropharynx is without other ulcerations and moist  Neck: Normal range of motion. Neck supple. No JVD present. No tracheal deviation present or significant neck LA or mass Cardiovascular: Normal rate, regular rhythm, normal heart sounds and intact distal pulses.   Pulmonary/Chest: WOB normal and breath sounds without rales or wheezing  Abdominal: Soft. Bowel sounds are normal. NT. No HSM  Musculoskeletal: Normal range of motion. Exhibits no edema Lymphadenopathy: Has no other cervical adenopathy.  Neurological: Pt is alert and oriented to person, place, and time. Pt has normal reflexes. No cranial nerve deficit. Motor grossly intact, Gait intact Skin: Skin is warm and dry. No rash noted or new ulcerations Psychiatric:  Has normal mood and affect. Behavior is normal without agitation No other exam findings Lab Results  Component Value Date   WBC 4.9 06/20/2017   HGB 13.9 06/20/2017   HCT 41.3 06/20/2017   PLT 189 06/20/2017   GLUCOSE 104 (H) 06/20/2017   CHOL 186 06/25/2017   TRIG 265.0 (H) 06/25/2017   HDL 33.40 (L) 06/25/2017   LDLDIRECT 98.0 06/25/2017   ALT 20 09/13/2016   AST 17 09/13/2016   NA 138 06/20/2017   K 4.1 06/20/2017   CL 103 06/20/2017   CREATININE 1.15 06/20/2017   BUN 11 06/20/2017   CO2 22 06/20/2017   TSH 2.29 06/25/2017   PSA 2.20 06/25/2017   INR 2.2 07/31/2018   HGBA1C 5.7 06/25/2017       Assessment & Plan:

## 2018-08-12 ENCOUNTER — Ambulatory Visit: Payer: 59 | Admitting: Internal Medicine

## 2018-08-22 ENCOUNTER — Other Ambulatory Visit: Payer: Self-pay | Admitting: Internal Medicine

## 2018-08-24 ENCOUNTER — Other Ambulatory Visit: Payer: Self-pay | Admitting: Emergency Medicine

## 2018-08-24 MED ORDER — OMEPRAZOLE 40 MG PO CPDR: DELAYED_RELEASE_CAPSULE | ORAL | 2 refills | Status: DC

## 2018-09-11 ENCOUNTER — Ambulatory Visit (INDEPENDENT_AMBULATORY_CARE_PROVIDER_SITE_OTHER): Payer: 59 | Admitting: General Practice

## 2018-09-11 DIAGNOSIS — Z8672 Personal history of thrombophlebitis: Secondary | ICD-10-CM | POA: Diagnosis not present

## 2018-09-11 LAB — POCT INR: INR: 2.2 (ref 2.0–3.0)

## 2018-09-11 NOTE — Patient Instructions (Addendum)
Pre visit review using our clinic review tool, if applicable. No additional management support is needed unless otherwise documented below in the visit note.  Continue to take 2 tablets daily except 1 tablet on Wednesdays.   Re-check in 6 weeks. 

## 2018-10-23 ENCOUNTER — Ambulatory Visit: Payer: 59

## 2018-10-27 ENCOUNTER — Other Ambulatory Visit: Payer: Self-pay

## 2018-10-27 ENCOUNTER — Ambulatory Visit (INDEPENDENT_AMBULATORY_CARE_PROVIDER_SITE_OTHER): Payer: 59 | Admitting: General Practice

## 2018-10-27 DIAGNOSIS — Z8672 Personal history of thrombophlebitis: Secondary | ICD-10-CM | POA: Diagnosis not present

## 2018-10-27 LAB — POCT INR: INR: 2.5 (ref 2.0–3.0)

## 2018-10-27 NOTE — Patient Instructions (Addendum)
Pre visit review using our clinic review tool, if applicable. No additional management support is needed unless otherwise documented below in the visit note.  Continue to take 2 tablets daily except 1 tablet on Wednesdays.   Re-check in 6 weeks. 

## 2018-11-04 ENCOUNTER — Emergency Department (HOSPITAL_COMMUNITY): Payer: 59

## 2018-11-04 ENCOUNTER — Other Ambulatory Visit: Payer: Self-pay

## 2018-11-04 ENCOUNTER — Emergency Department (HOSPITAL_COMMUNITY)
Admission: EM | Admit: 2018-11-04 | Discharge: 2018-11-04 | Disposition: A | Discharge status: Home / Self Care | Payer: 59 | Attending: Emergency Medicine | Admitting: Emergency Medicine

## 2018-11-04 ENCOUNTER — Ambulatory Visit: Payer: Self-pay | Admitting: *Deleted

## 2018-11-04 DIAGNOSIS — Z20828 Contact with and (suspected) exposure to other viral communicable diseases: Secondary | ICD-10-CM | POA: Insufficient documentation

## 2018-11-04 DIAGNOSIS — I10 Essential (primary) hypertension: Secondary | ICD-10-CM | POA: Diagnosis not present

## 2018-11-04 DIAGNOSIS — Z87891 Personal history of nicotine dependence: Secondary | ICD-10-CM | POA: Insufficient documentation

## 2018-11-04 DIAGNOSIS — R55 Syncope and collapse: Secondary | ICD-10-CM | POA: Diagnosis present

## 2018-11-04 DIAGNOSIS — R600 Localized edema: Secondary | ICD-10-CM | POA: Insufficient documentation

## 2018-11-04 DIAGNOSIS — Z79899 Other long term (current) drug therapy: Secondary | ICD-10-CM | POA: Insufficient documentation

## 2018-11-04 DIAGNOSIS — Z7901 Long term (current) use of anticoagulants: Secondary | ICD-10-CM | POA: Insufficient documentation

## 2018-11-04 DIAGNOSIS — Z86711 Personal history of pulmonary embolism: Secondary | ICD-10-CM | POA: Diagnosis not present

## 2018-11-04 LAB — CBC WITH DIFFERENTIAL/PLATELET
Abs Immature Granulocytes: 0.01 10*3/uL (ref 0.00–0.07)
Basophils Absolute: 0.1 10*3/uL (ref 0.0–0.1)
Basophils Relative: 2 %
Eosinophils Absolute: 0.1 10*3/uL (ref 0.0–0.5)
Eosinophils Relative: 3 %
HCT: 39.4 % (ref 39.0–52.0)
Hemoglobin: 13.1 g/dL (ref 13.0–17.0)
Immature Granulocytes: 0 %
Lymphocytes Relative: 24 %
Lymphs Abs: 1 10*3/uL (ref 0.7–4.0)
MCH: 29 pg (ref 26.0–34.0)
MCHC: 33.2 g/dL (ref 30.0–36.0)
MCV: 87.2 fL (ref 80.0–100.0)
Monocytes Absolute: 0.3 10*3/uL (ref 0.1–1.0)
Monocytes Relative: 7 %
Neutro Abs: 2.6 10*3/uL (ref 1.7–7.7)
Neutrophils Relative %: 64 %
Platelets: 181 10*3/uL (ref 150–400)
RBC: 4.52 MIL/uL (ref 4.22–5.81)
RDW: 12.6 % (ref 11.5–15.5)
WBC: 4.1 10*3/uL (ref 4.0–10.5)
nRBC: 0 % (ref 0.0–0.2)

## 2018-11-04 LAB — BASIC METABOLIC PANEL
Anion gap: 9 (ref 5–15)
BUN: 10 mg/dL (ref 8–23)
CO2: 24 mmol/L (ref 22–32)
Calcium: 8.8 mg/dL — ABNORMAL LOW (ref 8.9–10.3)
Chloride: 106 mmol/L (ref 98–111)
Creatinine, Ser: 1.09 mg/dL (ref 0.61–1.24)
GFR calc Af Amer: >60 mL/min (ref >60)
GFR calc non Af Amer: >60 mL/min (ref >60)
Glucose, Bld: 106 mg/dL — ABNORMAL HIGH (ref 70–99)
Potassium: 4.4 mmol/L (ref 3.5–5.1)
Sodium: 139 mmol/L (ref 135–145)

## 2018-11-04 LAB — PROTIME-INR
INR: 2.2 — ABNORMAL HIGH (ref 0.8–1.2)
Prothrombin Time: 24.4 seconds — ABNORMAL HIGH (ref 11.4–15.2)

## 2018-11-04 LAB — TROPONIN I: Troponin I: <0.03 ng/mL (ref <0.03)

## 2018-11-04 LAB — CBG MONITORING, ED: Glucose-Capillary: 104 mg/dL — ABNORMAL HIGH (ref 70–99)

## 2018-11-04 LAB — BRAIN NATRIURETIC PEPTIDE: B Natriuretic Peptide: 60 pg/mL (ref 0.0–100.0)

## 2018-11-04 MED ORDER — SODIUM CHLORIDE 0.9 % IV BOLUS
500.0000 mL | Freq: Once | INTRAVENOUS | Status: AC
  Administered 2018-11-04: 500 mL via INTRAVENOUS

## 2018-11-04 NOTE — ED Notes (Signed)
Pt. Stated, I have my little buddy (cell phone) to get in touch with anyone I need. Pt. Was talking about his procedure of an Echo Cardigram.

## 2018-11-04 NOTE — Telephone Encounter (Signed)
FYI

## 2018-11-04 NOTE — Discharge Instructions (Signed)
Please follow-up with Dr. Lovena Le for further evaluation of your symptoms and probable echocardiogram.  Please return to the emergency department immediately if you develop episodes of completely passing out, severe chest pain, shortness of breath, or any other new or concerning symptoms.

## 2018-11-04 NOTE — ED Triage Notes (Signed)
Pt c/o near syncope that occurred while he was walking at work ; pt states that he has been dealing with this for the past month but have gotten worse ; pt denies any chest pain but does that he has been having chest pain on and off for " years "  Denies any sob or fevers ; denies any focal weakness; grips firm and equal

## 2018-11-04 NOTE — Telephone Encounter (Addendum)
Called patient back, still waiting on his wife to take him to the ED. I stressed to him that he needs to be seen urgently. He voiced understanding. [facility] notified.  Pt called stating that he felt like he was going to faint so he had his b/p checked by the nurse at work. His b/p was 218/115 and about 10 mins later was 177/104. He also c/o headache. He has not missed any medications. He does not routinely check his b/p. Advised per protocol to get to the ED. Hi wife will be taking him to [facility]. Will notify his provider and route this to the office.    Reason for Disposition . [3] Systolic BP  >= 329 OR Diastolic >= 518 AND [8] cardiac or neurologic symptoms (e.g., chest pain, difficulty breathing, unsteady gait, blurred vision)  Answer Assessment - Initial Assessment Questions 1. BLOOD PRESSURE: "What is the blood pressure?" "Did you take at least two measurements 5 minutes apart?"     218/115 and 177/104 2. ONSET: "When did you take your blood pressure?"     Few mins ago 3. HOW: "How did you obtain the blood pressure?" (e.g., visiting nurse, automatic home BP monitor)     Nurse at the job 4. HISTORY: "Do you have a history of high blood pressure?"     yes 5. MEDICATIONS: "Are you taking any medications for blood pressure?" "Have you missed any doses recently?"     Yes on medications and have not missed any 6. OTHER SYMPTOMS: "Do you have any symptoms?" (e.g., headache, chest pain, blurred vision, difficulty breathing, weakness)     Headache, weakness, feeling faint 7. PREGNANCY: "Is there any chance you are pregnant?" "When was your last menstrual period?"     n/a  Protocols used: HIGH BLOOD PRESSURE-A-AH

## 2018-11-04 NOTE — ED Notes (Signed)
Patient verbalizes understanding of discharge instructions. Opportunity for questioning and answering were provided. Armband removed by staff , patient discharged from ED. 

## 2018-11-04 NOTE — ED Provider Notes (Signed)
[facility]   CSN: [id number] Arrival date & time: 11/04/18  1104    History   Chief Complaint Chief Complaint  Patient presents with   Near Syncope    HPI Justin Woodard is a 63 y.o. male with history of hypertension, pulmonary embolism, DVT anticoagulated on Coumadin who presents with a one-month history of intermittent near syncopal episodes.  He reports it usually happens when he is walking.  Today he was at work walking across the floor when he felt lightheaded, everything went white, and he felt like he was going to pass out.  He sat down and the episode passed.  He denies any chest pain now, but states he has had left-sided chest pain on and off for years.  He reports shortness of breath ever since he had his pulmonary embolisms.  He denies any fevers, cough.  He also reports some intermittent upper abdominal pain, however denies any at this time.  He denies any nausea, vomiting, urinary symptoms.  No medications taken prior to arrival for this problem.     HPI  Past Medical History:  Diagnosis Date   A-fib [facility](HCC)    post PE only   Anxiety    DVT (deep venous thrombosis) (HCC)    2000   Erectile dysfunction 05/28/2011   GERD (gastroesophageal reflux disease)    Hypertension    Pulmonary embolism (HCC)    Symptomatic PVCs     Patient Active Problem List   Diagnosis Date Noted   Cough 01/26/2018   Right knee pain 06/25/2017   Eustachian tube dysfunction, right 06/25/2017   Allergic rhinitis 06/25/2017   Acute sinus infection 11/29/2016   Dysphagia 09/13/2016   Radiculitis of right cervical region 09/13/2016   Hyperglycemia 08/08/2015   Encounter for therapeutic drug monitoring 06/22/2013   Shingles 02/06/2012   Erectile dysfunction 05/28/2011   Preventative health care 05/25/2011   Long term current use of anticoagulant 07/03/2010   Anxiety state 04/20/2007   Essential hypertension  04/20/2007   PE 04/20/2007   ATRIAL FIBRILLATION 04/20/2007   PREMATURE VENTRICULAR CONTRACTIONS 04/20/2007   GERD 04/20/2007   CHEST PAIN 04/20/2007   Personal history of venous thrombosis and embolism 04/20/2007   DEEP VENOUS THROMBOPHLEBITIS, HX OF 04/20/2007    Past Surgical History:  Procedure Laterality Date   TONSILLECTOMY     VEIN SURGERY     RLE saphenous vein stripping        Home Medications    Prior to Admission medications   Medication Sig Start Date End Date Taking? Authorizing Provider  metoprolol succinate (TOPROL-XL) 25 MG 24 hr tablet TAKE 1 TABLET(25 MG) BY MOUTH DAILY Patient taking differently: Take 25 mg by mouth at bedtime.  08/24/18  Yes Corwin LevinsJohn, James W, MD  warfarin (COUMADIN) 4 MG tablet TAKE 2 TABLETS DAILY OR TAKE AS DIRECTED BY ANTICOAGULATION CLINIC Patient taking differently: Take 4-8 mg by mouth See admin instructions. Take 4 mg on Wednesday Take 8 mg all other days in the evening 08/24/18  Yes Corwin LevinsJohn, James W, MD  cetirizine (ZYRTEC) 10 MG tablet Take 1 tablet (10 mg total) by mouth daily. Patient not taking: Reported on 11/04/2018 01/26/18 01/26/19  Corwin LevinsJohn, James W, MD  famotidine (PEPCID) 40 MG tablet Take 1 tablet (40 mg total) by mouth 2 (two) times daily. Patient not taking: Reported on 11/04/2018 03/12/18   Pincus SanesBurns, Stacy J, MD  fexofenadine (ALLEGRA) 180 MG tablet Take 1 tablet (180 mg total)  by mouth daily. Patient not taking: Reported on 11/04/2018 06/25/17 06/25/18  Biagio Borg, MD  omeprazole (PRILOSEC) 40 MG capsule Take 1 capsule 40 mg by mouth daily before breakfast. Patient not taking: Reported on 11/04/2018 08/24/18   Levin Erp, PA  triamcinolone (NASACORT) 55 MCG/ACT AERO nasal inhaler Place 2 sprays into the nose daily. Patient not taking: Reported on 11/04/2018 06/25/17   Biagio Borg, MD    Family History Family History  Problem Relation Age of Onset   Heart attack Father    Diabetes Unknown        first degree relatives     Social History Social History   Tobacco Use   Smoking status: Former Smoker   Smokeless tobacco: Never Used  Substance Use Topics   Alcohol use: No    Alcohol/week: 0.0 standard drinks   Drug use: No     Allergies   Oxycodone   Review of Systems Review of Systems  Constitutional: Negative for chills and fever.  HENT: Negative for facial swelling and sore throat.   Respiratory: Positive for shortness of breath (chronic).   Cardiovascular: Positive for chest pain (intermittent for years).  Gastrointestinal: Negative for abdominal pain, nausea and vomiting.  Genitourinary: Negative for dysuria.  Musculoskeletal: Negative for back pain.  Skin: Negative for rash and wound.  Neurological: Positive for syncope (near). Negative for weakness, numbness and headaches.  Psychiatric/Behavioral: The patient is not nervous/anxious.      Physical Exam Updated Vital Signs BP 135/74    Pulse 61    Temp 98 F (36.7 C) (Oral)    Resp 13    Ht 6\' 3"  (1.905 m)    Wt (!) 138.3 kg    SpO2 96%    BMI 38.12 kg/m   Physical Exam Vitals signs and nursing note reviewed.  Constitutional:      General: He is not in acute distress.    Appearance: He is well-developed. He is not diaphoretic.  HENT:     Head: Normocephalic and atraumatic.     Mouth/Throat:     Pharynx: No oropharyngeal exudate.  Eyes:     General: No scleral icterus.       Right eye: No discharge.        Left eye: No discharge.     Conjunctiva/sclera: Conjunctivae normal.     Pupils: Pupils are equal, round, and reactive to light.  Neck:     Musculoskeletal: Normal range of motion and neck supple.     Thyroid: No thyromegaly.  Cardiovascular:     Rate and Rhythm: Normal rate and regular rhythm.     Heart sounds: Normal heart sounds. No murmur. No friction rub. No gallop.   Pulmonary:     Effort: Pulmonary effort is normal. No respiratory distress.     Breath sounds: Normal breath sounds. No stridor. No wheezing  or rales.  Abdominal:     General: Bowel sounds are normal. There is no distension.     Palpations: Abdomen is soft.     Tenderness: There is no abdominal tenderness. There is no guarding or rebound.  Musculoskeletal:     Right lower leg: Edema (chronic, 1+) present.  Lymphadenopathy:     Cervical: No cervical adenopathy.  Skin:    General: Skin is warm and dry.     Coloration: Skin is not pale.     Findings: No rash.  Neurological:     Mental Status: He is alert.  Coordination: Coordination normal.     Comments: CN 3-12 intact; normal sensation throughout; 5/5 strength in all 4 extremities; equal bilateral grip strength      ED Treatments / Results  Labs (all labs ordered are listed, but only abnormal results are displayed) Labs Reviewed  BASIC METABOLIC PANEL - Abnormal; Notable for the following components:      Result Value   Glucose, Bld 106 (*)    Calcium 8.8 (*)    All other components within normal limits  PROTIME-INR - Abnormal; Notable for the following components:   Prothrombin Time 24.4 (*)    INR 2.2 (*)    All other components within normal limits  CBG MONITORING, ED - Abnormal; Notable for the following components:   Glucose-Capillary 104 (*)    All other components within normal limits  CBC WITH DIFFERENTIAL/PLATELET  TROPONIN I  BRAIN NATRIURETIC PEPTIDE    EKG EKG Interpretation  Date/Time:  Wednesday November 04 2018 11:14:07 EDT Ventricular Rate:  69 PR Interval:    QRS Duration: 106 QT Interval:  416 QTC Calculation: 446 R Axis:   76 Text Interpretation:  AV dissociation Probable left ventricular hypertrophy No significant change since last tracing Confirmed by Richardean CanalYao, David H [phone]([id number]) on 11/04/2018 11:32:18 AM Also confirmed by Richardean CanalYao, David H [phone]), editor Barbette Hairassel, Kerry [phone]([id number])  on 11/04/2018 1:37:20 PM   Radiology Dg Chest Portable 1 View  Result Date: 11/04/2018 CLINICAL DATA:  Syncope. EXAM: PORTABLE CHEST 1 VIEW COMPARISON:  January 26, 2017 FINDINGS: The heart size and mediastinal contours are within normal limits. Both lungs are clear. The visualized skeletal structures are unremarkable. IMPRESSION: No active disease. Electronically Signed   By: Gerome Samavid  Williams III M.D   On: 11/04/2018 13:57    Procedures Procedures (including critical care time)  Medications Ordered in ED Medications  sodium chloride 0.9 % bolus 500 mL (0 mLs Intravenous Stopped 11/04/18 1235)     Initial Impression / Assessment and Plan / ED Course  I have reviewed the triage vital signs and the nursing notes.  Pertinent labs & imaging results that were available during my care of the patient were reviewed by me and considered in my medical decision making (see chart for details).        Patient presenting with near syncopal episodes over the last month.  Patient denies any significant chest pain or shortness of breath that is new.  He has never completely lost consciousness.  He has never fallen.  Labs are completely unremarkable including troponin, BNP.  Chest x-ray is clear.  EKG shows no significant change since last tracing.  Patient's INR is therapeutic, low suspicion of recurrent PE.  Orthostatic vitals are unremarkable.  We discussed with patient the option of admission versus outpatient follow-up and he opts to follow-up with his cardiologist, Dr. Mayford Knifeurner.  We feel this is reasonable.  He is given strict return precautions including worsening symptoms, complete syncope, chest pain, shortness of breath.  He understands and agrees with plan.  Patient vital stable throughout ED course and discharged in satisfactory condition.  Patient also evaluated by my attending, Dr. Silverio LayYao, who guided the patient's management and agrees with plan.  Final Clinical Impressions(s) / ED Diagnoses   Final diagnoses:  Near syncope    ED Discharge Orders    None       Emi HolesLaw, Sylvia Kondracki M, PA-C 11/04/18 1643    Charlynne PanderYao, David Hsienta, MD 11/05/18 [phone]

## 2018-11-24 ENCOUNTER — Telehealth: Payer: 59 | Admitting: Internal Medicine

## 2018-12-10 ENCOUNTER — Telehealth: Payer: Self-pay

## 2018-12-10 NOTE — Telephone Encounter (Signed)
    COVID-19 Pre-Screening Questions:  . In the past 7 to 10 days have you had a cough,  shortness of breath, headache, congestion, fever (100 or greater) body aches, chills, sore throat, or sudden loss of taste or sense of smell? . Have you been around anyone with known Covid 19. . Have you been around anyone who is awaiting Covid 19 test results in the past 7 to 10 days? . Have you been around anyone who has been exposed to Covid 19, or has mentioned symptoms of Covid 19 within the past 7 to 10 days?  If you have any concerns/questions about symptoms patients report during screening (either on the phone or at threshold). Contact the provider seeing the patient or DOD for further guidance.  If neither are available contact a member of the leadership team.       I called and spoke with patient, he answered no to all of the above questions. 

## 2018-12-11 ENCOUNTER — Ambulatory Visit (INDEPENDENT_AMBULATORY_CARE_PROVIDER_SITE_OTHER): Payer: 59 | Admitting: General Practice

## 2018-12-11 ENCOUNTER — Ambulatory Visit: Payer: 59 | Admitting: Internal Medicine

## 2018-12-11 ENCOUNTER — Encounter: Payer: Self-pay | Admitting: Internal Medicine

## 2018-12-11 ENCOUNTER — Other Ambulatory Visit: Payer: Self-pay

## 2018-12-11 VITALS — BP 136/84 | HR 73 | Ht 75.0 in | Wt 300.2 lb

## 2018-12-11 DIAGNOSIS — I10 Essential (primary) hypertension: Secondary | ICD-10-CM

## 2018-12-11 DIAGNOSIS — I480 Paroxysmal atrial fibrillation: Secondary | ICD-10-CM | POA: Diagnosis not present

## 2018-12-11 DIAGNOSIS — Z8672 Personal history of thrombophlebitis: Secondary | ICD-10-CM | POA: Diagnosis not present

## 2018-12-11 DIAGNOSIS — R55 Syncope and collapse: Secondary | ICD-10-CM

## 2018-12-11 LAB — POCT INR: INR: 2.4 (ref 2.0–3.0)

## 2018-12-11 NOTE — Progress Notes (Signed)
HPI Mr. Pizzo returns today for followup of PAF, prior pulmonary embolism, obesity and HTN. He has continued to work and has done well since his last visit. He denies chest pain or sob. He relates an episode of altered mentation a couple of weeks ago where he did not pass out but was noted to have HTN. He was seen and DC from the ED. He feels well. He has not had any recent episodes of atrial fib. He is approaching retirement.  Allergies  Allergen Reactions  . Oxycodone Other (See Comments)    "Just made me feel bad"     Current Outpatient Medications  Medication Sig Dispense Refill  . cetirizine (ZYRTEC) 10 MG tablet Take 1 tablet (10 mg total) by mouth daily. 30 tablet 11  . famotidine (PEPCID) 40 MG tablet Take 1 tablet (40 mg total) by mouth 2 (two) times daily. 60 tablet 5  . metoprolol succinate (TOPROL-XL) 25 MG 24 hr tablet TAKE 1 TABLET(25 MG) BY MOUTH DAILY (Patient taking differently: Take 25 mg by mouth at bedtime. ) 90 tablet 1  . omeprazole (PRILOSEC) 40 MG capsule Take 1 capsule 40 mg by mouth daily before breakfast. 30 capsule 2  . triamcinolone (NASACORT) 55 MCG/ACT AERO nasal inhaler Place 2 sprays into the nose daily. 1 Inhaler 12  . warfarin (COUMADIN) 4 MG tablet TAKE 2 TABLETS DAILY OR TAKE AS DIRECTED BY ANTICOAGULATION CLINIC (Patient taking differently: Take 4-8 mg by mouth See admin instructions. Take 4 mg on Wednesday Take 8 mg all other days in the evening) 180 tablet 1  . fexofenadine (ALLEGRA) 180 MG tablet Take 1 tablet (180 mg total) by mouth daily. (Patient not taking: Reported on 11/04/2018) 90 tablet 3   No current facility-administered medications for this visit.      Past Medical History:  Diagnosis Date  . A-fib ([place])    post PE only  . Anxiety   . DVT (deep venous thrombosis) ([place])    2000  . Erectile dysfunction 05/28/2011  . GERD (gastroesophageal reflux disease)   . Hypertension   . Pulmonary embolism ([place])   . Symptomatic PVCs     ROS:   All systems reviewed and negative except as noted in the HPI.   Past Surgical History:  Procedure Laterality Date  . TONSILLECTOMY    . VEIN SURGERY     RLE saphenous vein stripping     Family History  Problem Relation Age of Onset  . Heart attack Father   . Diabetes Unknown        first degree relatives     Social History   Socioeconomic History  . Marital status: Married    Spouse name: Not on file  . Number of children: 2  . Years of education: Not on file  . Highest education level: Not on file  Occupational History  . Occupation: PARTS DEPT    Employer: UNEMPLOYED  Social Needs  . Financial resource strain: Not on file  . Food insecurity    Worry: Not on file    Inability: Not on file  . Transportation needs    Medical: Not on file    Non-medical: Not on file  Tobacco Use  . Smoking status: Former Research scientist (life sciences)  . Smokeless tobacco: Never Used  Substance and Sexual Activity  . Alcohol use: No    Alcohol/week: 0.0 standard drinks  . Drug use: No  . Sexual activity: Yes  Lifestyle  . Physical activity  Days per week: Not on file    Minutes per session: Not on file  . Stress: Not on file  Relationships  . Social Musicianconnections    Talks on phone: Not on file    Gets together: Not on file    Attends religious service: Not on file    Active member of club or organization: Not on file    Attends meetings of clubs or organizations: Not on file    Relationship status: Not on file  . Intimate partner violence    Fear of current or ex partner: Not on file    Emotionally abused: Not on file    Physically abused: Not on file    Forced sexual activity: Not on file  Other Topics Concern  . 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, broadcasting/film/videodept  equipment           BP 136/84   Pulse 73   Ht 6\' 3"  (1.905 m)   Wt (!) 300 lb 3.2 oz (136.2 kg)   SpO2 98%   BMI 37.52 kg/m    Physical Exam:  Well appearing but overweight 63yo man, NAD HEENT: Unremarkable Neck:  No JVD, no thyromegally Lymphatics:  No adenopathy Back:  No CVA tenderness Lungs:  Clear with no wheezes HEART:  Regular rate rhythm, no murmurs, no rubs, no clicks Abd:  soft, positive bowel sounds, no organomegally, no rebound, no guarding Ext:  2 plus pulses, no edema, no cyanosis, no clubbing Skin:  No rashes no nodules Neuro:  CN II through XII intact, motor grossly intact  EKG - none  Assess/Plan: 1. PAF - he appears to be maintaining NSR. He will continue warfarin. 2. Pulmonary embolism - he has had no recurrent spells and appears to be tolerating warfarin well. 3. Obesity - I asked that the patient lose 10 lbs. In the interim.  Leonia ReevesGregg Saysha Menta,M.D.

## 2018-12-11 NOTE — Patient Instructions (Signed)
Pre visit review using our clinic review tool, if applicable. No additional management support is needed unless otherwise documented below in the visit note.  Continue to take 2 tablets daily except 1 tablet on Wednesdays.   Re-check in 6 weeks. 

## 2018-12-11 NOTE — Patient Instructions (Addendum)
Medication Instructions:  °Your physician recommends that you continue on your current medications as directed. Please refer to the Current Medication list given to you today. ° °Labwork: °None ordered. ° °Testing/Procedures: °None ordered. ° °Follow-Up: °Your physician wants you to follow-up in: 4 months with Dr. Taylor.    ° ° ° °Any Other Special Instructions Will Be Listed Below (If Applicable). ° °If you need a refill on your cardiac medications before your next appointment, please call your pharmacy.  ° °

## 2019-01-29 ENCOUNTER — Ambulatory Visit (INDEPENDENT_AMBULATORY_CARE_PROVIDER_SITE_OTHER): Payer: 59 | Admitting: General Practice

## 2019-01-29 ENCOUNTER — Other Ambulatory Visit: Payer: Self-pay

## 2019-01-29 DIAGNOSIS — Z8672 Personal history of thrombophlebitis: Secondary | ICD-10-CM

## 2019-01-29 LAB — POCT INR: INR: 2.1 (ref 2.0–3.0)

## 2019-01-29 NOTE — Patient Instructions (Addendum)
Pre visit review using our clinic review tool, if applicable. No additional management support is needed unless otherwise documented below in the visit note.  Take 3 tablets today and then continue to take 2 tablets daily except 1 tablet on Wednesdays.  Re-check in 6 weeks.  

## 2019-02-26 ENCOUNTER — Telehealth: Payer: Self-pay | Admitting: Internal Medicine

## 2019-02-26 NOTE — Telephone Encounter (Signed)
Medication Refill - Medication:metoprolol succinate (TOPROL-XL) 25 MG 24 hr tablet   warfarin (COUMADIN) 4 MG tablet    Has the patient contacted their pharmacy? Yes.   (Agent: If no, request that the patient contact the pharmacy for the refill.) (Agent: If yes, when and what did the pharmacy advise?) Pharmacy advised they havent received provider approval   Preferred Pharmacy (with phone number or street name):  [id number] - [place], [address] AT [address] [phone] (Phone) [phone] (Fax)      Agent: Please be advised that RX refills may take up to 3 business days. We ask that you follow-up with your pharmacy.

## 2019-03-01 ENCOUNTER — Other Ambulatory Visit: Payer: Self-pay | Admitting: General Practice

## 2019-03-01 DIAGNOSIS — Z7901 Long term (current) use of anticoagulants: Secondary | ICD-10-CM

## 2019-03-01 MED ORDER — WARFARIN SODIUM 4 MG PO TABS: ORAL_TABLET | ORAL | 1 refills | Status: DC

## 2019-03-01 MED ORDER — METOPROLOL SUCCINATE ER 25 MG PO TB24: 25.0000 mg | ORAL_TABLET | Freq: Every day | ORAL | 2 refills | Status: DC

## 2019-03-12 ENCOUNTER — Other Ambulatory Visit: Payer: Self-pay

## 2019-03-12 ENCOUNTER — Ambulatory Visit (INDEPENDENT_AMBULATORY_CARE_PROVIDER_SITE_OTHER): Payer: 59 | Admitting: General Practice

## 2019-03-12 DIAGNOSIS — Z8672 Personal history of thrombophlebitis: Secondary | ICD-10-CM | POA: Diagnosis not present

## 2019-03-12 LAB — POCT INR: INR: 2.3 (ref 2.0–3.0)

## 2019-03-12 NOTE — Patient Instructions (Addendum)
Pre visit review using our clinic review tool, if applicable. No additional management support is needed unless otherwise documented below in the visit note.  Continue to take 2 tablets daily except 1 tablet on Wednesdays.   Re-check in 6 weeks. 

## 2019-03-12 NOTE — Progress Notes (Signed)
Medical screening examination/treatment/procedure(s) were performed by non-physician practitioner and as supervising physician I was immediately available for consultation/collaboration. I agree with above. James John, MD   

## 2019-04-23 ENCOUNTER — Ambulatory Visit: Payer: 59

## 2019-04-25 ENCOUNTER — Other Ambulatory Visit: Payer: Self-pay

## 2019-04-25 ENCOUNTER — Ambulatory Visit
Admission: EM | Admit: 2019-04-25 | Discharge: 2019-04-25 | Disposition: A | Discharge status: Home / Self Care | Payer: 59 | Attending: Emergency Medicine | Admitting: Emergency Medicine

## 2019-04-25 ENCOUNTER — Encounter: Payer: Self-pay | Admitting: Emergency Medicine

## 2019-04-25 DIAGNOSIS — R030 Elevated blood-pressure reading, without diagnosis of hypertension: Secondary | ICD-10-CM

## 2019-04-25 DIAGNOSIS — R6889 Other general symptoms and signs: Secondary | ICD-10-CM

## 2019-04-25 DIAGNOSIS — R5383 Other fatigue: Secondary | ICD-10-CM | POA: Diagnosis not present

## 2019-04-25 DIAGNOSIS — R05 Cough: Secondary | ICD-10-CM

## 2019-04-25 DIAGNOSIS — Z20828 Contact with and (suspected) exposure to other viral communicable diseases: Secondary | ICD-10-CM | POA: Diagnosis not present

## 2019-04-25 DIAGNOSIS — R6883 Chills (without fever): Secondary | ICD-10-CM

## 2019-04-25 DIAGNOSIS — Z760 Encounter for issue of repeat prescription: Secondary | ICD-10-CM

## 2019-04-25 DIAGNOSIS — R059 Cough, unspecified: Secondary | ICD-10-CM

## 2019-04-25 MED ORDER — FAMOTIDINE 40 MG PO TABS: 40.0000 mg | ORAL_TABLET | Freq: Two times a day (BID) | ORAL | 5 refills | Status: DC

## 2019-04-25 MED ORDER — PREDNISONE 20 MG PO TABS: 20.0000 mg | ORAL_TABLET | Freq: Every day | ORAL | 0 refills | Status: DC

## 2019-04-25 NOTE — ED Triage Notes (Signed)
Pt presents to [facility] for assessment of 2 days of left ear pain, chills and body aches, fatigue, scratchy/sore throat, mild cough.  States he's been having sinus congestion for 2 months.

## 2019-04-25 NOTE — ED Provider Notes (Signed)
[facility]    CSN: [id number] Arrival date & time: 04/25/19  1051      History   Chief Complaint Chief Complaint  Patient presents with  . Flu-Like Symptoms    HPI Justin Woodard is a 63 y.o. male with history of obesity, hypertension, DVT, A. fib on warfarin, presenting for 2-day course of flulike symptoms.  Patient endorsing chills, aches, fatigue, scratchy throat, mild, nonproductive cough.  Patient denies shortness of breath, wheezing, chest pain.  No fevers.  Has not taken anything for his symptoms.  No known sick contacts.   Past Medical History:  Diagnosis Date  . A-fib (HCC)    post PE only  . Anxiety   . DVT (deep venous thrombosis) (HCC)    2000  . Erectile dysfunction 05/28/2011  . GERD (gastroesophageal reflux disease)   . Hypertension   . Pulmonary embolism (HCC)   . Symptomatic PVCs     Patient Active Problem List   Diagnosis Date Noted  . Near syncope 12/11/2018  . Cough 01/26/2018  . Right knee pain 06/25/2017  . Eustachian tube dysfunction, right 06/25/2017  . Allergic rhinitis 06/25/2017  . Acute sinus infection 11/29/2016  . Dysphagia 09/13/2016  . Radiculitis of right cervical region 09/13/2016  . Hyperglycemia 08/08/2015  . Encounter for therapeutic drug monitoring 06/22/2013  . Shingles 02/06/2012  . Erectile dysfunction 05/28/2011  . Preventative health care 05/25/2011  . Long term current use of anticoagulant 07/03/2010  . Anxiety state 04/20/2007  . Essential hypertension 04/20/2007  . PE 04/20/2007  . ATRIAL FIBRILLATION 04/20/2007  . PREMATURE VENTRICULAR CONTRACTIONS 04/20/2007  . GERD 04/20/2007  . CHEST PAIN 04/20/2007  . Personal history of venous thrombosis and embolism 04/20/2007  . DEEP VENOUS THROMBOPHLEBITIS, HX OF 04/20/2007    Past Surgical History:  Procedure Laterality Date  . TONSILLECTOMY    . VEIN SURGERY     RLE saphenous vein stripping       Home Medications    Prior to Admission  medications   Medication Sig Start Date End Date Taking? Authorizing Provider  cetirizine (ZYRTEC) 10 MG tablet Take 1 tablet (10 mg total) by mouth daily. 01/26/18 01/26/19  Corwin Levins, MD  famotidine (PEPCID) 40 MG tablet Take 1 tablet (40 mg total) by mouth 2 (two) times daily. 04/25/19   Hall-Potvin, [place], PA-C  fexofenadine (ALLEGRA) 180 MG tablet Take 1 tablet (180 mg total) by mouth daily. Patient not taking: Reported on 11/04/2018 06/25/17 06/25/18  Corwin Levins, MD  metoprolol succinate (TOPROL-XL) 25 MG 24 hr tablet Take 1 tablet (25 mg total) by mouth at bedtime. 03/01/19   Corwin Levins, MD  omeprazole (PRILOSEC) 40 MG capsule Take 1 capsule 40 mg by mouth daily before breakfast. 08/24/18   Unk Lightning, PA  predniSONE (DELTASONE) 20 MG tablet Take 1 tablet (20 mg total) by mouth daily. 04/25/19   Hall-Potvin, [place], PA-C  triamcinolone (NASACORT) 55 MCG/ACT AERO nasal inhaler Place 2 sprays into the nose daily. 06/25/17   Corwin Levins, MD  warfarin (COUMADIN) 4 MG tablet Take 2 tablets daily except take 1 tablet on Wed or AS DIRECTED BY ANTICOAGULATION CLINIC 03/01/19   Corwin Levins, MD    Family History Family History  Problem Relation Age of Onset  . Heart attack Father   . Diabetes Other        first degree relatives    Social History Social History   Tobacco Use  .  Smoking status: Former Games developermoker  . Smokeless tobacco: Never Used  Substance Use Topics  . Alcohol use: No    Alcohol/week: 0.0 standard drinks  . Drug use: No     Allergies   Oxycodone   Review of Systems Review of Systems  Constitutional: Positive for chills and fatigue. Negative for activity change, appetite change and fever.  HENT: Positive for sore throat. Negative for trouble swallowing and voice change.   Respiratory: Positive for cough. Negative for shortness of breath and wheezing.   Cardiovascular: Negative for chest pain and palpitations.  Gastrointestinal: Negative for abdominal  distention, abdominal pain, diarrhea, nausea and vomiting.  Musculoskeletal: Positive for myalgias. Negative for arthralgias, back pain, gait problem, neck pain and neck stiffness.  Skin: Negative for rash and wound.  Neurological: Negative for speech difficulty and headaches.  All other systems reviewed and are negative.    Physical Exam Triage Vital Signs ED Triage Vitals  Enc Vitals Group     BP      Pulse      Resp      Temp      Temp src      SpO2      Weight      Height      Head Circumference      Peak Flow      Pain Score      Pain Loc      Pain Edu?      Excl. in GC?    No data found.  Updated Vital Signs BP (!) 155/93 (BP Location: Left Arm)   Pulse 73   Temp 98.6 F (37 C) (Temporal)   Resp 18   SpO2 97%   Visual Acuity Right Eye Distance:   Left Eye Distance:   Bilateral Distance:    Right Eye Near:   Left Eye Near:    Bilateral Near:     Physical Exam Constitutional:      General: He is not in acute distress.    Appearance: He is obese. He is not toxic-appearing.  HENT:     Head: Normocephalic and atraumatic.     Right Ear: Tympanic membrane, ear canal and external ear normal.     Left Ear: Tympanic membrane, ear canal and external ear normal.     Ears:     Comments: Negative tragal tenderness bilaterally.    Nose: No congestion or rhinorrhea.     Comments: Turbinates without edema bilaterally.  Mucosa is mildly injected.  Negative sinus tenderness bilaterally.    Mouth/Throat:     Mouth: Mucous membranes are moist.     Pharynx: Oropharynx is clear. No oropharyngeal exudate or posterior oropharyngeal erythema.  Eyes:     General: No scleral icterus.    Conjunctiva/sclera: Conjunctivae normal.     Pupils: Pupils are equal, round, and reactive to light.  Neck:     Musculoskeletal: Normal range of motion and neck supple. No muscular tenderness.  Cardiovascular:     Rate and Rhythm: Normal rate and regular rhythm.  Pulmonary:     Effort:  Pulmonary effort is normal. No respiratory distress.     Breath sounds: No stridor. No wheezing, rhonchi or rales.     Comments: Good air entry bilaterally without prolonged expiratory phase Lymphadenopathy:     Cervical: No cervical adenopathy.  Skin:    Coloration: Skin is not jaundiced or pale.  Neurological:     Mental Status: He is alert and oriented to person, place,  and time.      UC Treatments / Results  Labs (all labs ordered are listed, but only abnormal results are displayed) Labs Reviewed  NOVEL CORONAVIRUS, NAA    EKG   Radiology No results found.  Procedures Procedures (including critical care time)  Medications Ordered in UC Medications - No data to display  Initial Impression / Assessment and Plan / UC Course  I have reviewed the triage vital signs and the nursing notes.  Pertinent labs & imaging results that were available during my care of the patient were reviewed by me and considered in my medical decision making (see chart for details).     Patient afebrile, nontoxic in office today.  Covid PCR pending: Patient to quarantine until results are back.  Given patient reported history of COPD, cough will start prednisone.  Patient's blood pressure is moderately elevated: Asymptomatic-we will follow-up with PCP for further surveillance.  Sent refill of patient's current medications at patient's request.  Return precautions discussed, patient verbalized understanding and is agreeable to plan. Final Clinical Impressions(s) / UC Diagnoses   Final diagnoses:  Cough  Flu-like symptoms     Discharge Instructions     Your COVID test is pending - it is important to quarantine / isolate at home until your results are back. If you test positive and would like further evaluation for persistent or worsening symptoms, you may schedule an E-visit or virtual (video) visit throughout the [facility] app or website.  PLEASE NOTE: If you develop severe chest  pain or shortness of breath please go to the ER or call [phone] for further evaluation --> DO NOT schedule electronic or virtual visits for this. Please call our office for further guidance / recommendations as needed.    ED Prescriptions    Medication Sig Dispense Auth. Provider   famotidine (PEPCID) 40 MG tablet Take 1 tablet (40 mg total) by mouth 2 (two) times daily. 60 tablet Hall-Potvin, [place], PA-C   predniSONE (DELTASONE) 20 MG tablet Take 1 tablet (20 mg total) by mouth daily. 5 tablet Hall-Potvin, [place], PA-C     PDMP not reviewed this encounter.   Hall-Potvin, [place], [place] 04/25/19 1408

## 2019-04-25 NOTE — Discharge Instructions (Signed)
Your COVID test is pending - it is important to quarantine / isolate at home until your results are back. °If you test positive and would like further evaluation for persistent or worsening symptoms, you may schedule an E-visit or virtual (video) visit throughout the [facility] MyChart app or website. ° °PLEASE NOTE: If you develop severe chest pain or shortness of breath please go to the ER or call 9-1-1 for further evaluation --> DO NOT schedule electronic or virtual visits for this. °Please call our office for further guidance / recommendations as needed. °

## 2019-04-25 NOTE — ED Notes (Signed)
Patient able to ambulate independently  

## 2019-04-26 LAB — NOVEL CORONAVIRUS, NAA: SARS-CoV-2, NAA: NOT DETECTED

## 2019-04-27 ENCOUNTER — Other Ambulatory Visit: Payer: Self-pay

## 2019-04-27 ENCOUNTER — Ambulatory Visit (INDEPENDENT_AMBULATORY_CARE_PROVIDER_SITE_OTHER): Payer: 59 | Admitting: General Practice

## 2019-04-27 DIAGNOSIS — Z7901 Long term (current) use of anticoagulants: Secondary | ICD-10-CM

## 2019-04-27 LAB — POCT INR: INR: 2.4 (ref 2.0–3.0)

## 2019-04-27 NOTE — Progress Notes (Signed)
Medical screening examination/treatment/procedure(s) were performed by non-physician practitioner and as supervising physician I was immediately available for consultation/collaboration. I agree with above. James John, MD   

## 2019-04-27 NOTE — Patient Instructions (Signed)
Pre visit review using our clinic review tool, if applicable. No additional management support is needed unless otherwise documented below in the visit note.  Continue to take 2 tablets daily except 1 tablet on Wednesdays.   Re-check in 6 weeks. 

## 2019-05-03 ENCOUNTER — Other Ambulatory Visit: Payer: Self-pay | Admitting: Internal Medicine

## 2019-05-03 ENCOUNTER — Encounter: Payer: Self-pay | Admitting: Internal Medicine

## 2019-05-03 DIAGNOSIS — R1310 Dysphagia, unspecified: Secondary | ICD-10-CM

## 2019-05-18 ENCOUNTER — Encounter: Payer: Self-pay | Admitting: *Deleted

## 2019-05-22 ENCOUNTER — Inpatient Hospital Stay
Admission: RE | Admit: 2019-05-22 | Discharge: 2019-05-22 | Disposition: A | Discharge status: Home / Self Care | Payer: 59 | Source: Ambulatory Visit

## 2019-05-22 ENCOUNTER — Telehealth: Payer: 59 | Admitting: Family

## 2019-05-22 DIAGNOSIS — B9689 Other specified bacterial agents as the cause of diseases classified elsewhere: Secondary | ICD-10-CM

## 2019-05-22 DIAGNOSIS — J069 Acute upper respiratory infection, unspecified: Secondary | ICD-10-CM

## 2019-05-22 MED ORDER — AMOXICILLIN 500 MG PO TABS: 500.0000 mg | ORAL_TABLET | Freq: Three times a day (TID) | ORAL | 0 refills | Status: DC

## 2019-05-22 MED ORDER — FLUTICASONE PROPIONATE 50 MCG/ACT NA SUSP: 2.0000 | Freq: Every day | NASAL | 0 refills | Status: DC

## 2019-05-22 NOTE — Progress Notes (Signed)
We are sorry you are not feeling well.  Here is how we plan to help!  Based on what you have shared with me, it looks like you may have a viral upper respiratory infection.  Upper respiratory infections are caused by a large number of viruses; however, rhinovirus is the most common cause.   Symptoms vary from person to person, with common symptoms including sore throat, cough, fatigue or lack of energy and feeling of general discomfort.  A low-grade fever of up to 100.4 may present, but is often uncommon.  Symptoms vary however, and are closely related to a person's age or underlying illnesses.  The most common symptoms associated with an upper respiratory infection are nasal discharge or congestion, cough, sneezing, headache and pressure in the ears and face.  These symptoms usually persist for about 3 to 10 days, but can last up to 2 weeks.  It is important to know that upper respiratory infections do not cause serious illness or complications in most cases.    Upper respiratory infections can be transmitted from person to person, with the most common method of transmission being a person's hands.  The virus is able to live on the skin and can infect other persons for up to 2 hours after direct contact.  Also, these can be transmitted when someone coughs or sneezes; thus, it is important to cover the mouth to reduce this risk.  To keep the spread of the illness at [place], good hand hygiene is very important.  This is an infection that is most likely caused by a virus. There are no specific treatments other than to help you with the symptoms until the infection runs its course.  We are sorry you are not feeling well.  Here is how we plan to help!   For nasal congestion, you may use an oral decongestants such as Mucinex D or if you have glaucoma or high blood pressure use plain Mucinex.  Saline nasal spray or nasal drops can help and can safely be used as often as needed for congestion.  For your congestion,  I have prescribed Fluticasone nasal spray one spray in each nostril twice a day. I have also prescribed Amoxil 500mg  three times a day for 7 days  If you do not have a history of heart disease, hypertension, diabetes or thyroid disease, prostate/bladder issues or glaucoma, you may also use Sudafed to treat nasal congestion.  It is highly recommended that you consult with a pharmacist or your primary care physician to ensure this medication is safe for you to take.     If you have a cough, you may use cough suppressants such as Delsym and Robitussin.  If you have glaucoma or high blood pressure, you can also use Coricidin HBP.     If you have a sore or scratchy throat, use a saltwater gargle-  to  teaspoon of salt dissolved in a 4-ounce to 8-ounce glass of warm water.  Gargle the solution for approximately 15-30 seconds and then spit.  It is important not to swallow the solution.  You can also use throat lozenges/cough drops and Chloraseptic spray to help with throat pain or discomfort.  Warm or cold liquids can also be helpful in relieving throat pain.  For headache, pain or general discomfort, you can use Ibuprofen or Tylenol as directed.   Some authorities believe that zinc sprays or the use of Echinacea may shorten the course of your symptoms.   HOME CARE . Only  take medications as instructed by your medical team. . Be sure to drink plenty of fluids. Water is fine as well as fruit juices, sodas and electrolyte beverages. You may want to stay away from caffeine or alcohol. If you are nauseated, try taking small sips of liquids. How do you know if you are getting enough fluid? Your urine should be a pale yellow or almost colorless. . Get rest. . Taking a steamy shower or using a humidifier may help nasal congestion and ease sore throat pain. You can place a towel over your head and breathe in the steam from hot water coming from a faucet. . Using a saline nasal spray works much the same  way. . Cough drops, hard candies and sore throat lozenges may ease your cough. . Avoid close contacts especially the very young and the elderly . Cover your mouth if you cough or sneeze . Always remember to wash your hands.   GET HELP RIGHT AWAY IF: . You develop worsening fever. . If your symptoms do not improve within 10 days . You develop yellow or green discharge from your nose over 3 days. . You have coughing fits . You develop a severe head ache or visual changes. . You develop shortness of breath, difficulty breathing or start having chest pain . Your symptoms persist after you have completed your treatment plan  MAKE SURE YOU   Understand these instructions.  Will watch your condition.  Will get help right away if you are not doing well or get worse.  Your e-visit answers were reviewed by a board certified advanced clinical practitioner to complete your personal care plan. Depending upon the condition, your plan could have included both over the counter or prescription medications. Please review your pharmacy choice. If there is a problem, you may call our nursing hot line at and have the prescription routed to another pharmacy. Your safety is important to [place]. If you have drug allergies check your prescription carefully.   You can use MyChart to ask questions about today's visit, request a non-urgent call back, or ask for a work or school excuse for 24 hours related to this e-Visit. If it has been greater than 24 hours you will need to follow up with your provider, or enter a new e-Visit to address those concerns. You will get an e-mail in the next two days asking about your experience.  I hope that your e-visit has been valuable and will speed your recovery. Thank you for using e-visits.     Greater than 5 minutes, yet less than 10 minutes of time have been spent researching, coordinating, and implementing care for this patient today.  Thank you for the details you  included in the comment boxes. Those details are very helpful in determining the best course of treatment for you and help [place] to provide the best care.

## 2019-05-26 ENCOUNTER — Other Ambulatory Visit: Payer: Self-pay | Admitting: Internal Medicine

## 2019-05-28 ENCOUNTER — Ambulatory Visit: Payer: 59 | Admitting: Physician Assistant

## 2019-06-10 ENCOUNTER — Ambulatory Visit: Payer: 59 | Admitting: Gastroenterology

## 2019-06-10 ENCOUNTER — Encounter: Payer: Self-pay | Admitting: Gastroenterology

## 2019-06-10 DIAGNOSIS — R1314 Dysphagia, pharyngoesophageal phase: Secondary | ICD-10-CM | POA: Diagnosis not present

## 2019-06-10 DIAGNOSIS — K219 Gastro-esophageal reflux disease without esophagitis: Secondary | ICD-10-CM | POA: Diagnosis not present

## 2019-06-10 NOTE — Patient Instructions (Signed)
If you are age 66 or older, your body mass index should be between 23-30. Your Body mass index is 37.5 kg/m. If this is out of the aforementioned range listed, please consider follow up with your Primary Care Provider.  If you are age 107 or younger, your body mass index should be between 19-25. Your Body mass index is 37.5 kg/m. If this is out of the aformentioned range listed, please consider follow up with your Primary Care Provider.   You have been scheduled for a Barium Esophogram at [facility] (1st floor of the hospital) on 06/21/19 at 10:30. Please arrive 15 minutes prior to your appointment for registration. Make certain not to have anything to eat or drink 3 hours prior to your test. If you need to reschedule for any reason, please contact radiology at [phone] to do so. _________________________________________________________ A barium swallow is an examination that concentrates on views of the esophagus. This tends to be a double contrast exam (barium and two liquids which, when combined, create a gas to distend the wall of the oesophagus) or single contrast (non-ionic iodine based). The study is usually tailored to your symptoms so a good history is essential. Attention is paid during the study to the form, structure and configuration of the esophagus, looking for functional disorders (such as aspiration, dysphagia, achalasia, motility and reflux) EXAMINATION You may be asked to change into a gown, depending on the type of swallow being performed. A radiologist and radiographer will perform the procedure. The radiologist will advise you of the type of contrast selected for your procedure and direct you during the exam. You will be asked to stand, sit or lie in several different positions and to hold a small amount of fluid in your mouth before being asked to swallow while the imaging is performed .In some instances you may be asked to swallow barium coated marshmallows to assess  the motility of a solid food bolus. The exam can be recorded as a digital or video fluoroscopy procedure. POST PROCEDURE It will take 1-2 days for the barium to pass through your system. To facilitate this, it is important, unless otherwise directed, to increase your fluids for the next 24-48hrs and to resume your normal diet.  This test typically takes about 30 minutes to perform. _________________________________________________________  Gastroesophageal Reflux Disease, Adult Gastroesophageal reflux (GER) happens when acid from the stomach flows up into the tube that connects the mouth and the stomach (esophagus). Normally, food travels down the esophagus and stays in the stomach to be digested. However, when a person has GER, food and stomach acid sometimes move back up into the esophagus. If this becomes a more serious problem, the person may be diagnosed with a disease called gastroesophageal reflux disease (GERD). GERD occurs when the reflux:  Happens often.  Causes frequent or severe symptoms.  Causes problems such as damage to the esophagus. When stomach acid comes in contact with the esophagus, the acid may cause soreness (inflammation) in the esophagus. Over time, GERD may create small holes (ulcers) in the lining of the esophagus. What are the causes? This condition is caused by a problem with the muscle between the esophagus and the stomach (lower esophageal sphincter, or LES). Normally, the LES muscle closes after food passes through the esophagus to the stomach. When the LES is weakened or abnormal, it does not close properly, and that allows food and stomach acid to go back up into the esophagus. The LES can be weakened by certain  dietary substances, medicines, and medical conditions, including:  Tobacco use.  Pregnancy.  Having a hiatal hernia.  Alcohol use.  Certain foods and beverages, such as coffee, chocolate, onions, and peppermint. What increases the risk? You are  more likely to develop this condition if you:  Have an increased body weight.  Have a connective tissue disorder.  Use NSAID medicines. What are the signs or symptoms? Symptoms of this condition include:  Heartburn.  Difficult or painful swallowing.  The feeling of having a lump in the throat.  Abitter taste in the mouth.  Bad breath.  Having a large amount of saliva.  Having an upset or bloated stomach.  Belching.  Chest pain. Different conditions can cause chest pain. Make sure you see your health care provider if you experience chest pain.  Shortness of breath or wheezing.  Ongoing (chronic) cough or a night-time cough.  Wearing away of tooth enamel.  Weight loss. How is this diagnosed? Your health care provider will take a medical history and perform a physical exam. To determine if you have mild or severe GERD, your health care provider may also monitor how you respond to treatment. You may also have tests, including:  A test to examine your stomach and esophagus with a small camera (endoscopy).  A test thatmeasures the acidity level in your esophagus.  A test thatmeasures how much pressure is on your esophagus.  A barium swallow or modified barium swallow test to show the shape, size, and functioning of your esophagus. How is this treated? The goal of treatment is to help relieve your symptoms and to prevent complications. Treatment for this condition may vary depending on how severe your symptoms are. Your health care provider may recommend:  Changes to your diet.  Medicine.  Surgery. Follow these instructions at home: Eating and drinking   Follow a diet as recommended by your health care provider. This may involve avoiding foods and drinks such as: ? Coffee and tea (with or without caffeine). ? Drinks that containalcohol. ? Energy drinks and sports drinks. ? Carbonated drinks or sodas. ? Chocolate and cocoa. ? Peppermint and mint  flavorings. ? Garlic and onions. ? Horseradish. ? Spicy and acidic foods, including peppers, chili powder, curry powder, vinegar, hot sauces, and barbecue sauce. ? Citrus fruit juices and citrus fruits, such as oranges, lemons, and limes. ? Tomato-based foods, such as red sauce, chili, salsa, and pizza with red sauce. ? Fried and fatty foods, such as donuts, french fries, potato chips, and high-fat dressings. ? High-fat meats, such as hot dogs and fatty cuts of red and white meats, such as rib eye steak, sausage, ham, and bacon. ? High-fat dairy items, such as whole milk, butter, and cream cheese.  Eat small, frequent meals instead of large meals.  Avoid drinking large amounts of liquid with your meals.  Avoid eating meals during the 2-3 hours before bedtime.  Avoid lying down right after you eat.  Do not exercise right after you eat. Lifestyle   Do not use any products that contain nicotine or tobacco, such as cigarettes, e-cigarettes, and chewing tobacco. If you need help quitting, ask your health care provider.  Try to reduce your stress by using methods such as yoga or meditation. If you need help reducing stress, ask your health care provider.  If you are overweight, reduce your weight to an amount that is healthy for you. Ask your health care provider for guidance about a safe weight loss goal. General instructions  Pay attention to any changes in your symptoms.  Take over-the-counter and prescription medicines only as told by your health care provider. Do not take aspirin, ibuprofen, or other NSAIDs unless your health care provider told you to do so.  Wear loose-fitting clothing. Do not wear anything tight around your waist that causes pressure on your abdomen.  Raise (elevate) the head of your bed about 6 inches (15 cm).  Avoid bending over if this makes your symptoms worse.  Keep all follow-up visits as told by your health care provider. This is important. Contact a  health care provider if:  You have: ? New symptoms. ? Unexplained weight loss. ? Difficulty swallowing or it hurts to swallow. ? Wheezing or a persistent cough. ? A hoarse voice.  Your symptoms do not improve with treatment. Get help right away if you:  Have pain in your arms, neck, jaw, teeth, or back.  Feel sweaty, dizzy, or light-headed.  Have chest pain or shortness of breath.  Vomit and your vomit looks like blood or coffee grounds.  Faint.  Have stool that is bloody or black.  Cannot swallow, drink, or eat. Summary  Gastroesophageal reflux happens when acid from the stomach flows up into the esophagus. GERD is a disease in which the reflux happens often, causes frequent or severe symptoms, or causes problems such as damage to the esophagus.  Treatment for this condition may vary depending on how severe your symptoms are. Your health care provider may recommend diet and lifestyle changes, medicine, or surgery.  Contact a health care provider if you have new or worsening symptoms.  Take over-the-counter and prescription medicines only as told by your health care provider. Do not take aspirin, ibuprofen, or other NSAIDs unless your health care provider told you to do so.  Keep all follow-up visits as told by your health care provider. This is important. This information is not intended to replace advice given to you by your health care provider. Make sure you discuss any questions you have with your health care provider. Document Revised: 11/12/2017 Document Reviewed: 11/12/2017 Elsevier Patient Education  2020 [employer].  Food Choices for Gastroesophageal Reflux Disease, Adult When you have gastroesophageal reflux disease (GERD), the foods you eat and your eating habits are very important. Choosing the right foods can help ease your discomfort. Think about working with a nutrition specialist (dietitian) to help you make good choices. What are tips for following this  plan?  Meals  Choose healthy foods that are low in fat, such as fruits, vegetables, whole grains, low-fat dairy products, and lean meat, fish, and poultry.  Eat small meals often instead of 3 large meals a day. Eat your meals slowly, and in a place where you are relaxed. Avoid bending over or lying down until 2-3 hours after eating.  Avoid eating meals 2-3 hours before bed.  Avoid drinking a lot of liquid with meals.  Cook foods using methods other than frying. Bake, grill, or broil food instead.  Avoid or limit: ? Chocolate. ? Peppermint or spearmint. ? Alcohol. ? Pepper. ? Black and decaffeinated coffee. ? Black and decaffeinated tea. ? Bubbly (carbonated) soft drinks. ? Caffeinated energy drinks and soft drinks.  Limit high-fat foods such as: ? Fatty meat or fried foods. ? Whole milk, cream, butter, or ice cream. ? Nuts and nut butters. ? Pastries, donuts, and sweets made with butter or shortening.  Avoid foods that cause symptoms. These foods may be different for everyone. Common foods  that cause symptoms include: ? Tomatoes. ? Oranges, lemons, and limes. ? Peppers. ? Spicy food. ? Onions and garlic. ? Vinegar. Lifestyle  Maintain a healthy weight. Ask your doctor what weight is healthy for you. If you need to lose weight, work with your doctor to do so safely.  Exercise for at least 30 minutes for 5 or more days each week, or as told by your doctor.  Wear loose-fitting clothes.  Do not smoke. If you need help quitting, ask your doctor.  Sleep with the head of your bed higher than your feet. Use a wedge under the mattress or blocks under the bed frame to raise the head of the bed. Summary  When you have gastroesophageal reflux disease (GERD), food and lifestyle choices are very important in easing your symptoms.  Eat small meals often instead of 3 large meals a day. Eat your meals slowly, and in a place where you are relaxed.  Limit high-fat foods such as  fatty meat or fried foods.  Avoid bending over or lying down until 2-3 hours after eating.  Avoid peppermint and spearmint, caffeine, alcohol, and chocolate. This information is not intended to replace advice given to you by your health care provider. Make sure you discuss any questions you have with your health care provider. Document Revised: 08/27/2018 Document Reviewed: 06/11/2016 Elsevier Patient Education  [employer].  It was a pleasure to see you today!  Dr. Loletha Carrow

## 2019-06-10 NOTE — Progress Notes (Signed)
[facility] Follow-up Note:  History: Justin Woodard 06/10/2019  Referring provider: Corwin Levins, MD  Reason for consult/chief complaint: Dysphagia (occasionally when not taking omeprazole. Was told not to take it over 14 days at a time. )   Subjective  HPI: Seen in clinic by one of our PAs June 2018 for 2 years of pharyngeal esophageal dysphagia.  EGD for that symptom and screening colonoscopy were discussed with patient, who decided not to have procedures since he had had a pulmonary embolism in the past even while on a Lovenox bridge for period of time off Coumadin.  Therefore Cologuard and barium swallow ordered. Tells me today he decided not to do the Cologuard. The patient had been prescribed omeprazole, and a chart message several weeks after the office visit indicates that he was feeling much better with no ongoing dysphagia, so canceled his barium esophagram.  Over the last few years he has been taking short courses of omeprazole, up to 14 days at a time.  When he does that, the dysphagia resolves.  After stopping it, he will eventually get recurrence of food feeling stuck in the neck or upper chest.  Resuming the acid suppression again resolves the symptoms.  He has years of chronic regurgitation and pyrosis, and has overnight episodes that might wake him up and cause him to cough with feelings of sore throat.  He denies abdominal pain, altered bowel habits or rectal bleeding.  ROS:  Review of Systems  Constitutional: Positive for fatigue. Negative for appetite change and unexpected weight change.  HENT: Negative for mouth sores and voice change.   Eyes: Negative for pain and redness.  Respiratory: Negative for cough and shortness of breath.   Cardiovascular: Positive for leg swelling. Negative for chest pain and palpitations.  Genitourinary: Negative for dysuria and hematuria.  Musculoskeletal: Negative for arthralgias and myalgias.  Skin: Negative  for pallor and rash.  Neurological: Negative for weakness and headaches.  Hematological: Negative for adenopathy.     Past Medical History: Past Medical History:  Diagnosis Date  . A-fib (HCC)    post PE only  . Anxiety state 04/20/2007   Qualifier: Diagnosis of  By: Jonny Ruiz MD, Len Blalock   . DVT (deep venous thrombosis) (HCC)    2000  . Dysphagia 09/13/2016  . Erectile dysfunction 05/28/2011  . Eustachian tube dysfunction, right 06/25/2017  . GERD (gastroesophageal reflux disease)   . Hypertension   . Long term current use of anticoagulant 07/03/2010  . Pulmonary embolism (HCC)   . Radiculitis of right cervical region 09/13/2016  . Right knee pain 06/25/2017  . Shingles 02/06/2012  . Symptomatic PVCs    I reviewed last cardiology office note with Dr. Ladona Ridgel dated 12/11/2018  Past Surgical History: Past Surgical History:  Procedure Laterality Date  . TONSILLECTOMY    . VEIN SURGERY     RLE saphenous vein stripping     Family History: Family History  Problem Relation Age of Onset  . Heart attack Father   . Diabetes Other        first degree relatives    Social History: Social History   Socioeconomic History  . Marital status: Married    Spouse name: Not on file  . Number of children: 2  . Years of education: Not on file  . Highest education level: Not on file  Occupational History  . Occupation: PARTS DEPT    Employer: UNEMPLOYED  Tobacco Use  . Smoking status: Former  Smoker  . Smokeless tobacco: Never Used  Substance and Sexual Activity  . Alcohol use: No    Alcohol/week: 0.0 standard drinks  . Drug use: No  . Sexual activity: Yes  Other Topics Concern  . 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 Difficulty of Paying Living Expenses: Not on file  Food Insecurity:   . Worried About Paediatric nurse in the Last Year: Not on file  . Ran Out of Food in the Last Year: Not on file  Transportation Needs:   . Lack of Transportation (Medical): Not on file  . Lack of Transportation (Non-Medical): Not on file  Physical Activity:   . Days of Exercise per Week: Not on file  . Minutes of Exercise per Session: Not on file  Stress:   . Feeling of Stress : Not on file  Social Connections:   . Frequency of Communication with Friends and Family: Not on file  . Frequency of Social Gatherings with Friends and Family: Not on file  . Attends Religious Services: Not on file  . Active Member of Clubs or Organizations: Not on file  . Attends Archivist Meetings: Not on file  . Marital Status: Not on file    Allergies: Allergies  Allergen Reactions  . Oxycodone Other (See Comments)    "Just made me feel bad"    Outpatient Meds: Current Outpatient Medications  Medication Sig Dispense Refill  . metoprolol succinate (TOPROL-XL) 25 MG 24 hr tablet Take 1 tablet (25 mg total) by mouth at bedtime. Annual appt due in [facility] must see provider for future refills 30 tablet 2  . omeprazole (PRILOSEC) 40 MG capsule Take 1 capsule 40 mg by mouth daily before breakfast. 30 capsule 2  . triamcinolone (NASACORT) 55 MCG/ACT AERO nasal inhaler Place 2 sprays into the nose daily. 1 Inhaler 12  . warfarin (COUMADIN) 4 MG tablet Take 2 tablets daily except take 1 tablet on Wed or AS DIRECTED BY ANTICOAGULATION CLINIC 180 tablet 1   No current facility-administered medications for this visit.      ___________________________________________________________________ Objective   Exam:  BP 132/80   Pulse 75   Temp 97.6 F (36.4 C)   Ht 6\' 3"  (1.905 m)   Wt 300 lb (136.1 kg)   BMI 37.50 kg/m    General: Well-appearing, normal vocal quality  Eyes: sclera anicteric, no redness  ENT: oral mucosa moist without lesions, no cervical or supraclavicular lymphadenopathy  CV: RRR without  murmur, S1/S2, no JVD,3+ peripheral edema to knee bilaterally  Resp: clear to auscultation bilaterally, normal RR and effort noted  GI: soft, no tenderness, with active bowel sounds.  No appreciable hepatosplenomegaly or mass, though limited by body habitus.  Skin; warm and dry, no rash or jaundice noted  Neuro: awake, alert and oriented x 3. Normal gross motor function and fluent speech   Assessment: Encounter Diagnoses  Name Primary?  . Gastroesophageal reflux disease, unspecified whether esophagitis present   . Pharyngoesophageal dysphagia     Longstanding GERD with dysphagia that is most likely dysmotility since it improves with acid suppressive therapy.  He is currently taking omeprazole 1 tablet every other day with good control of symptoms.  However,  I discussed the potential long-term harm of poorly controlled reflux including, but not limited to, esophagitis, stricture, lung disease and cancer. As such, we gave him reading materials about all the nonpharmacologic antireflux measures that I believe would help him significantly.  He tends to eat larger portions, especially at dinner, and eats late in the evening.  Changing eating habits, elevating head of bed, and weight loss would be the most helpful interventions for him.  Plan:  He still did not wish to pursue upper endoscopy since he has the same concerns about being off his anticoagulation.  I therefore suggested a barium swallow to assess for stricture, malignancy or sizable hiatal hernia.  He was agreeable to that.  Christen Bame still did not wish to have colon cancer screening done, either colonoscopy or Cologuard.   30 minutes were spent on this encounter (including chart review, history/exam, counseling/coordination of care, and documentation)   Charlie Pitter III  CC: Referring provider noted above

## 2019-06-11 ENCOUNTER — Ambulatory Visit: Payer: 59 | Admitting: General Practice

## 2019-06-11 ENCOUNTER — Other Ambulatory Visit: Payer: Self-pay

## 2019-06-11 DIAGNOSIS — Z8672 Personal history of thrombophlebitis: Secondary | ICD-10-CM | POA: Diagnosis not present

## 2019-06-11 LAB — POCT INR: INR: 2 (ref 2.0–3.0)

## 2019-06-11 NOTE — Patient Instructions (Addendum)
Pre visit review using our clinic review tool, if applicable. No additional management support is needed unless otherwise documented below in the visit note.  Take 3 tablets today and then continue to take 2 tablets daily except 1 tablet on Wednesdays.  Re-check in 6 weeks.

## 2019-06-11 NOTE — Progress Notes (Signed)
Medical screening examination/treatment/procedure(s) were performed by non-physician practitioner and as supervising physician I was immediately available for consultation/collaboration. I agree with above. Winfrey Chillemi, MD   

## 2019-06-18 ENCOUNTER — Other Ambulatory Visit: Payer: Self-pay

## 2019-06-18 ENCOUNTER — Ambulatory Visit: Payer: 59 | Admitting: Physician Assistant

## 2019-06-18 VITALS — BP 128/84 | HR 77 | Ht 75.0 in | Wt 299.0 lb

## 2019-06-18 DIAGNOSIS — I480 Paroxysmal atrial fibrillation: Secondary | ICD-10-CM | POA: Diagnosis not present

## 2019-06-18 DIAGNOSIS — I10 Essential (primary) hypertension: Secondary | ICD-10-CM | POA: Diagnosis not present

## 2019-06-18 NOTE — Progress Notes (Signed)
Cardiology Office Note Date:  06/18/2019  Patient ID:  Justin Woodard 10-30-1955, MRN [id number] PCP:  Corwin Levins, MD  Electrophysiologist:  Dr. Ladona Ridgel   Chief Complaint: routine follow up visit  History of Present Illness: [name] is a 64 y.o. male with history of AF associated with DVT/PE (2000), HTN, GERD, PVCs.  He comes today to be seen for Dr. Ladona Ridgel, last seen by him July 2020.  At that visit had an event of some altered mentation a few eeks prior, associated with high BP, no syncope, evaluated at the ER and discharged.  No symptoms of AFib.  No changes were made.  Recently had some flu like symptoms, negative for COVID in Dec, more recently seeing GI for some dysphagia, did not want to pursue EGD not wanting to interrupt his a/c, so planned for barium swallow.  He is doing well.  Works at [employer], mostly with Animator. On his feet part of his day, more seated these days.  He does not exercise.  He denies any kind of cardiac awareness, no CP or palpitations.  He denies any SOB or DOE with his ADLs, no difficulties.  No dizzy spells near syncope or syncope.  He mentions he has had bad veins in his legs since his youth and c/o a constant aching/heaviness in his legs, both at rest and with ambulation.  Ambulation does not necessarily cause worsening and does not need to stop walking.  Asks who he should see for this. Says years ago he spent most of his day at work on his feet, though has had "bad veins" even when he was much younger.  He denies any bleeding or signs of bleeding, warfarin is monitored and managed by his PMD/coumadin clinic  He has a annual visit with his PMD in march, will be getting his lipids done    Past Medical History:  Diagnosis Date  . A-fib (HCC)    post PE only  . Anxiety state 04/20/2007   Qualifier: Diagnosis of  By: Jonny Ruiz MD, Len Blalock   . DVT (deep venous thrombosis) (HCC)    2000  . Dysphagia 09/13/2016  .  Erectile dysfunction 05/28/2011  . Eustachian tube dysfunction, right 06/25/2017  . GERD (gastroesophageal reflux disease)   . Hypertension   . Long term current use of anticoagulant 07/03/2010  . Pulmonary embolism (HCC)   . Radiculitis of right cervical region 09/13/2016  . Right knee pain 06/25/2017  . Shingles 02/06/2012  . Symptomatic PVCs     Past Surgical History:  Procedure Laterality Date  . TONSILLECTOMY    . VEIN SURGERY     RLE saphenous vein stripping    Current Outpatient Medications  Medication Sig Dispense Refill  . metoprolol succinate (TOPROL-XL) 25 MG 24 hr tablet Take 1 tablet (25 mg total) by mouth at bedtime. Annual appt due in [facility] must see provider for future refills 30 tablet 2  . omeprazole (PRILOSEC) 40 MG capsule Take 1 capsule 40 mg by mouth daily before breakfast. 30 capsule 2  . triamcinolone (NASACORT) 55 MCG/ACT AERO nasal inhaler Place 2 sprays into the nose daily. 1 Inhaler 12  . warfarin (COUMADIN) 4 MG tablet Take 2 tablets daily except take 1 tablet on Wed or AS DIRECTED BY ANTICOAGULATION CLINIC 180 tablet 1   No current facility-administered medications for this visit.    Allergies:   Oxycodone   Social History:  The patient  reports that he  has quit smoking. He has never used smokeless tobacco. He reports that he does not drink alcohol or use drugs.   Family History:  The patient's family history includes Diabetes in an other family member; Heart attack in his father.  ROS:  Please see the history of present illness.All other systems are reviewed and otherwise negative.   PHYSICAL EXAM:  VS:  BP 128/84   Pulse 77   Ht 6\' 3"  (1.905 m)   Wt 299 lb (135.6 kg)   SpO2 97%   BMI 37.37 kg/m  BMI: Body mass index is 37.37 kg/m. Well nourished, well developed, in no acute distress  HEENT: normocephalic, atraumatic  Neck: no JVD, carotid bruits or masses Cardiac:  RRR; no significant murmurs, no rubs, or gallops Lungs:  CTA b/l, no wheezing,  rhonchi or rales  Abd: soft, non tender, obese MS: no deformity atrophy Ext:  trace edema b/l (reported as chronic and at baseline with known severe varicose veins), significant spider veins in his feet.  LLE DP and PT pulses are 2+, R foot pulses more difficult, found easily by doppler though only 1+ to palpation.  No cyanosis, feet are warm Skin: warm and dry, no rash Neuro:  No gross deficits appreciated Psych: euthymic mood, full affect   EKG: not done today  08/02/16 TTE Study Conclusions - Left ventricle: The cavity size was normal. Wall thickness was increased in a pattern of mild LVH. Systolic function was normal. The estimated ejection fraction was in the range of 60% to 65%. Wall motion was normal; there were no regional wall motion abnormalities. - Left atrium: The atrium was mildly dilated. - Right atrium: The atrium was mildly dilated.   Recent Labs: 08/05/2018: ALT 18; TSH 3.39 11/04/2018: B Natriuretic Peptide 60.0; BUN 10; Creatinine, Ser 1.09; Hemoglobin 13.1; Platelets 181; Potassium 4.4; Sodium 139  08/05/2018: Cholesterol 200; Direct LDL 105.0; HDL 35.60; Total CHOL/HDL Ratio 6; Triglycerides 233.0; VLDL 46.6   CrCl cannot be calculated (Patient's most recent lab result is older than the maximum 21 days allowed.).   Wt Readings from Last 3 Encounters:  06/18/19 299 lb (135.6 kg)  06/10/19 300 lb (136.1 kg)  12/11/18 (!) 300 lb 3.2 oz (136.2 kg)     Other studies reviewed: Additional studies/records reviewed today include: summarized above  ASSESSMENT AND PLAN:  1. Paroxysmal AFib     CHA2DS2Vasc is 2, on warfarin also w/hx of 2/2 DVT/PE hx     No symptoms to suggest recurrent AF  2. HTN     No changes    Encouraged to exercise, heathy diet and weight loss   Disposition: he is given the information for Vascular vein specialist [facility] for evaluation, we will see him back in 1 year otherwise, sooner if needed  Current medicines are  reviewed at length with the patient today.  The patient did not have any concerns regarding medicines.  Venetia Night, PA-C 06/18/2019 3:22 PM     [name] [address] [phone] (office)  [phone] (fax)

## 2019-06-18 NOTE — Patient Instructions (Signed)
Medication Instructions:  Your physician recommends that you continue on your current medications as directed. Please refer to the Current Medication list given to you today.  *If you need a refill on your cardiac medications before your next appointment, please call your pharmacy*  Lab Work: NONE ORDERED  TODAY  If you have labs (blood work) drawn today and your tests are completely normal, you will receive your results only by: . MyChart Message (if you have MyChart) OR . A paper copy in the mail If you have any lab test that is abnormal or we need to change your treatment, we will call you to review the results.  Testing/Procedures: NONE ORDERED  TODAY   Follow-Up: At [facility], you and your health needs are our priority.  As part of our continuing mission to provide you with exceptional heart care, we have created designated Provider Care Teams.  These Care Teams include your primary Cardiologist (physician) and Advanced Practice Providers (APPs -  Physician Assistants and Nurse Practitioners) who all work together to provide you with the care you need, when you need it.  Your next appointment:   1 year(s)  The format for your next appointment:   In Person  Provider:   You may see   one of the following Advanced Practice Providers on your designated Care Team:    Amber Seiler, NP  Renee Ursuy, PA-C  Michael "Andy" Tillery, PA-C   Other Instructions   

## 2019-06-21 ENCOUNTER — Other Ambulatory Visit: Payer: Self-pay

## 2019-06-21 ENCOUNTER — Ambulatory Visit (HOSPITAL_COMMUNITY)
Admission: RE | Admit: 2019-06-21 | Discharge: 2019-06-21 | Disposition: A | Discharge status: Home / Self Care | Payer: 59 | Source: Ambulatory Visit | Attending: Gastroenterology | Admitting: Gastroenterology

## 2019-06-21 DIAGNOSIS — R1314 Dysphagia, pharyngoesophageal phase: Secondary | ICD-10-CM | POA: Diagnosis present

## 2019-06-21 DIAGNOSIS — K219 Gastro-esophageal reflux disease without esophagitis: Secondary | ICD-10-CM | POA: Diagnosis present

## 2019-07-22 ENCOUNTER — Other Ambulatory Visit: Payer: Self-pay

## 2019-07-22 ENCOUNTER — Ambulatory Visit: Payer: 59 | Admitting: General Practice

## 2019-07-22 DIAGNOSIS — Z7901 Long term (current) use of anticoagulants: Secondary | ICD-10-CM

## 2019-07-22 LAB — POCT INR: INR: 2.9 (ref 2.0–3.0)

## 2019-07-22 NOTE — Patient Instructions (Addendum)
Pre visit review using our clinic review tool, if applicable. No additional management support is needed unless otherwise documented below in the visit note.  Take 1 tablet daily and then continue to take 2 tablets daily except 1 tablet on Wednesdays.  Re-check in 6 weeks.

## 2019-07-22 NOTE — Progress Notes (Signed)
Medical screening examination/treatment/procedure(s) were performed by non-physician practitioner and as supervising physician I was immediately available for consultation/collaboration. I agree with above. Telitha Plath, MD   

## 2019-07-23 ENCOUNTER — Ambulatory Visit: Payer: 59

## 2019-07-29 ENCOUNTER — Encounter: Payer: Self-pay | Admitting: Internal Medicine

## 2019-07-29 ENCOUNTER — Ambulatory Visit (INDEPENDENT_AMBULATORY_CARE_PROVIDER_SITE_OTHER): Payer: 59 | Admitting: Internal Medicine

## 2019-07-29 DIAGNOSIS — G43809 Other migraine, not intractable, without status migrainosus: Secondary | ICD-10-CM

## 2019-07-29 DIAGNOSIS — R739 Hyperglycemia, unspecified: Secondary | ICD-10-CM | POA: Diagnosis not present

## 2019-07-29 DIAGNOSIS — G43909 Migraine, unspecified, not intractable, without status migrainosus: Secondary | ICD-10-CM | POA: Insufficient documentation

## 2019-07-29 DIAGNOSIS — I10 Essential (primary) hypertension: Secondary | ICD-10-CM

## 2019-07-29 MED ORDER — BUTALBITAL-APAP-CAFFEINE 50-300-40 MG PO CAPS: ORAL_CAPSULE | ORAL | 0 refills | Status: DC

## 2019-07-29 NOTE — Assessment & Plan Note (Addendum)
Ok for firoricet prn, f/u with cpx next visit  I spent 31 minutes in preparing to see the patient by review of recent labs, imaging and procedures, obtaining and reviewing separately obtained history, communicating with the patient and family or caregiver, ordering medications, tests or procedures, and documenting clinical information in the EHR including the differential Dx, treatment, and any further evaluation and other management of migraine, DM, HTN

## 2019-07-29 NOTE — Patient Instructions (Signed)
Please take all new medication as prescribed 

## 2019-07-29 NOTE — Assessment & Plan Note (Signed)
stable overall by history and exam, recent data reviewed with pt, and pt to continue medical treatment as before,  to f/u any worsening symptoms or concerns  

## 2019-07-29 NOTE — Assessment & Plan Note (Signed)
Encouraged to f/u BP at home and next visit 

## 2019-07-29 NOTE — Progress Notes (Signed)
Patient ID: Justin Woodard, male   DOB: 08-18-1955, 64 y.o.   MRN: [id number]  Virtual Visit via Video Note  I connected with Lahoma Rocker on 07/29/19 at  3:20 PM EST by a video enabled telemedicine application and verified that I am speaking with the correct person using two identifiers.  Location: Patient: at home Provider: at office   I discussed the limitations of evaluation and management by telemedicine and the availability of in person appointments. The patient expressed understanding and agreed to proceed.  History of Present Illness: Here with 2-3 times weekly throbbin bitemporal HA with transient visual scotoma that occur just prior, last several hrs, tylenol no help, assoc with photophobia and nausea.  Pt denies new neurological symptoms such as new headache, or facial or extremity weakness or numbness  Pt denies chest pain, increased sob or doe, wheezing, orthopnea, PND, increased LE swelling, palpitations, dizziness or syncope.   Pt denies polydipsia, polyuria Past Medical History:  Diagnosis Date  . A-fib ([place])    post PE only  . Anxiety state 04/20/2007   Qualifier: Diagnosis of  By: Jenny Reichmann MD, Hunt Oris   . DVT (deep venous thrombosis) ([place])    2000  . Dysphagia 09/13/2016  . Erectile dysfunction 05/28/2011  . Eustachian tube dysfunction, right 06/25/2017  . GERD (gastroesophageal reflux disease)   . Hypertension   . Long term current use of anticoagulant 07/03/2010  . Pulmonary embolism ([place])   . Radiculitis of right cervical region 09/13/2016  . Right knee pain 06/25/2017  . Shingles 02/06/2012  . Symptomatic PVCs    Past Surgical History:  Procedure Laterality Date  . TONSILLECTOMY    . VEIN SURGERY     RLE saphenous vein stripping    reports that he has quit smoking. He has never used smokeless tobacco. He reports that he does not drink alcohol or use drugs. family history includes Diabetes in an other family member; Heart attack in his father. Allergies  Allergen  Reactions  . Oxycodone Other (See Comments)    "Just made me feel bad"   Current Outpatient Medications on File Prior to Visit  Medication Sig Dispense Refill  . metoprolol succinate (TOPROL-XL) 25 MG 24 hr tablet Take 1 tablet (25 mg total) by mouth at bedtime. Annual appt due in [facility] must see provider for future refills 30 tablet 2  . omeprazole (PRILOSEC) 40 MG capsule Take 1 capsule 40 mg by mouth daily before breakfast. 30 capsule 2  . triamcinolone (NASACORT) 55 MCG/ACT AERO nasal inhaler Place 2 sprays into the nose daily. 1 Inhaler 12  . warfarin (COUMADIN) 4 MG tablet Take 2 tablets daily except take 1 tablet on Wed or AS DIRECTED BY ANTICOAGULATION CLINIC 180 tablet 1   No current facility-administered medications on file prior to visit.    Observations/Objective: Alert, NAD, appropriate mood and affect, resps normal, cn 2-12 intact, moves all 4s, no visible rash or swelling Lab Results  Component Value Date   WBC 4.1 11/04/2018   HGB 13.1 11/04/2018   HCT 39.4 11/04/2018   PLT 181 11/04/2018   GLUCOSE 106 (H) 11/04/2018   CHOL 200 08/05/2018   TRIG 233.0 (H) 08/05/2018   HDL 35.60 (L) 08/05/2018   LDLDIRECT 105.0 08/05/2018   ALT 18 08/05/2018   AST 15 08/05/2018   NA 139 11/04/2018   K 4.4 11/04/2018   CL 106 11/04/2018   CREATININE 1.09 11/04/2018   BUN 10 11/04/2018   CO2 24 11/04/2018  TSH 3.39 08/05/2018   PSA 1.60 08/05/2018   INR 2.9 07/22/2019   HGBA1C 5.6 08/05/2018   Assessment and Plan: See notes  Follow Up Instructions: Seen otes   I discussed the assessment and treatment plan with the patient. The patient was provided an opportunity to ask questions and all were answered. The patient agreed with the plan and demonstrated an understanding of the instructions.   The patient was advised to call back or seek an in-person evaluation if the symptoms worsen or if the condition fails to improve as anticipated.  Oliver Barre, MD

## 2019-08-06 ENCOUNTER — Ambulatory Visit: Payer: 59 | Admitting: Internal Medicine

## 2019-08-13 ENCOUNTER — Ambulatory Visit: Payer: 59 | Admitting: Internal Medicine

## 2019-08-13 ENCOUNTER — Other Ambulatory Visit: Payer: Self-pay

## 2019-08-13 ENCOUNTER — Encounter: Payer: Self-pay | Admitting: Internal Medicine

## 2019-08-13 VITALS — BP 160/92 | HR 64 | Temp 98.0°F | Ht 76.0 in | Wt 303.6 lb

## 2019-08-13 DIAGNOSIS — E538 Deficiency of other specified B group vitamins: Secondary | ICD-10-CM | POA: Diagnosis not present

## 2019-08-13 DIAGNOSIS — E611 Iron deficiency: Secondary | ICD-10-CM | POA: Diagnosis not present

## 2019-08-13 DIAGNOSIS — Z0000 Encounter for general adult medical examination without abnormal findings: Secondary | ICD-10-CM

## 2019-08-13 DIAGNOSIS — E559 Vitamin D deficiency, unspecified: Secondary | ICD-10-CM | POA: Diagnosis not present

## 2019-08-13 DIAGNOSIS — R42 Dizziness and giddiness: Secondary | ICD-10-CM

## 2019-08-13 DIAGNOSIS — R739 Hyperglycemia, unspecified: Secondary | ICD-10-CM | POA: Diagnosis not present

## 2019-08-13 DIAGNOSIS — F411 Generalized anxiety disorder: Secondary | ICD-10-CM

## 2019-08-13 DIAGNOSIS — I10 Essential (primary) hypertension: Secondary | ICD-10-CM

## 2019-08-13 DIAGNOSIS — Z0001 Encounter for general adult medical examination with abnormal findings: Secondary | ICD-10-CM

## 2019-08-13 LAB — IBC PANEL
Iron: 72 ug/dL (ref 42–165)
Saturation Ratios: 20.2 % (ref 20.0–50.0)
Transferrin: 254 mg/dL (ref 212.0–360.0)

## 2019-08-13 LAB — CBC WITH DIFFERENTIAL/PLATELET
Basophils Absolute: 0.1 10*3/uL (ref 0.0–0.1)
Basophils Relative: 1.4 % (ref 0.0–3.0)
Eosinophils Absolute: 0.1 10*3/uL (ref 0.0–0.7)
Eosinophils Relative: 2.5 % (ref 0.0–5.0)
HCT: 38.2 % — ABNORMAL LOW (ref 39.0–52.0)
Hemoglobin: 13.3 g/dL (ref 13.0–17.0)
Lymphocytes Relative: 33.5 % (ref 12.0–46.0)
Lymphs Abs: 1.9 10*3/uL (ref 0.7–4.0)
MCHC: 34.7 g/dL (ref 30.0–36.0)
MCV: 85.4 fl (ref 78.0–100.0)
Monocytes Absolute: 0.4 10*3/uL (ref 0.1–1.0)
Monocytes Relative: 7.2 % (ref 3.0–12.0)
Neutro Abs: 3.1 10*3/uL (ref 1.4–7.7)
Neutrophils Relative %: 55.4 % (ref 43.0–77.0)
Platelets: 199 10*3/uL (ref 150.0–400.0)
RBC: 4.48 Mil/uL (ref 4.22–5.81)
RDW: 13.5 % (ref 11.5–15.5)
WBC: 5.6 10*3/uL (ref 4.0–10.5)

## 2019-08-13 LAB — HEPATIC FUNCTION PANEL
ALT: 17 U/L (ref 0–53)
AST: 14 U/L (ref 0–37)
Albumin: 4 g/dL (ref 3.5–5.2)
Alkaline Phosphatase: 67 U/L (ref 39–117)
Bilirubin, Direct: 0.2 mg/dL (ref 0.0–0.3)
Total Bilirubin: 1.2 mg/dL (ref 0.2–1.2)
Total Protein: 6.7 g/dL (ref 6.0–8.3)

## 2019-08-13 LAB — BASIC METABOLIC PANEL
BUN: 10 mg/dL (ref 6–23)
CO2: 32 mEq/L (ref 19–32)
Calcium: 9 mg/dL (ref 8.4–10.5)
Chloride: 102 mEq/L (ref 96–112)
Creatinine, Ser: 1.03 mg/dL (ref 0.40–1.50)
GFR: 72.28 mL/min (ref >60.00)
Glucose, Bld: 94 mg/dL (ref 70–99)
Potassium: 3.9 mEq/L (ref 3.5–5.1)
Sodium: 138 mEq/L (ref 135–145)

## 2019-08-13 LAB — URINALYSIS, ROUTINE W REFLEX MICROSCOPIC
Bilirubin Urine: NEGATIVE
Hgb urine dipstick: NEGATIVE
Ketones, ur: NEGATIVE
Leukocytes,Ua: NEGATIVE
Nitrite: NEGATIVE
RBC / HPF: NONE SEEN (ref 0–?)
Specific Gravity, Urine: 1.015 (ref 1.000–1.030)
Total Protein, Urine: NEGATIVE
Urine Glucose: NEGATIVE
Urobilinogen, UA: 0.2 (ref 0.0–1.0)
pH: 7 (ref 5.0–8.0)

## 2019-08-13 LAB — LIPID PANEL
Cholesterol: 190 mg/dL (ref 0–200)
HDL: 35.6 mg/dL — ABNORMAL LOW (ref >39.00)
NonHDL: 153.97
Total CHOL/HDL Ratio: 5
Triglycerides: 243 mg/dL — ABNORMAL HIGH (ref 0.0–149.0)
VLDL: 48.6 mg/dL — ABNORMAL HIGH (ref 0.0–40.0)

## 2019-08-13 LAB — PSA: PSA: 1.59 ng/mL (ref 0.10–4.00)

## 2019-08-13 LAB — VITAMIN B12: Vitamin B-12: 206 pg/mL — ABNORMAL LOW (ref 211–911)

## 2019-08-13 LAB — LDL CHOLESTEROL, DIRECT: Direct LDL: 92 mg/dL

## 2019-08-13 LAB — VITAMIN D 25 HYDROXY (VIT D DEFICIENCY, FRACTURES): VITD: 16.59 ng/mL — ABNORMAL LOW (ref 30.00–100.00)

## 2019-08-13 LAB — HEMOGLOBIN A1C: Hgb A1c MFr Bld: 5.6 % (ref 4.6–6.5)

## 2019-08-13 LAB — TSH: TSH: 3.39 u[IU]/mL (ref 0.35–4.50)

## 2019-08-13 MED ORDER — AMLODIPINE BESYLATE 5 MG PO TABS: 5.0000 mg | ORAL_TABLET | Freq: Every day | ORAL | 3 refills | Status: DC

## 2019-08-13 NOTE — Progress Notes (Signed)
Subjective:    Patient ID: Justin Woodard, male    DOB: [birth year], 64 y.o.   MRN: [id number]  HPI  Here for wellness and f/u;  Overall doing ok;  Pt denies Chest pain, worsening SOB, DOE, wheezing, orthopnea, PND, worsening LE edema, palpitations, or syncope, but has had some low HR by oximetry at home that may or may not have been associated with dizziness, not sure  Pt denies neurological change such as new headache, facial or extremity weakness.  Pt denies polydipsia, polyuria, or low sugar symptoms. Pt states overall good compliance with treatment and medications, good tolerability, and has been trying to follow appropriate diet.  Pt denies worsening depressive symptoms, suicidal ideation or panic. No fever, night sweats, wt loss, loss of appetite, or other constitutional symptoms.  Pt states good ability with ADL's, has low fall risk, home safety reviewed and adequate, no other significant changes in hearing or vision, and only occasionally active with exercise. Past Medical History:  Diagnosis Date  . A-fib ([place])    post PE only  . Anxiety state 04/20/2007   Qualifier: Diagnosis of  By: Jenny Reichmann MD, Hunt Oris   . DVT (deep venous thrombosis) ([place])    2000  . Dysphagia 09/13/2016  . Erectile dysfunction 05/28/2011  . Eustachian tube dysfunction, right 06/25/2017  . GERD (gastroesophageal reflux disease)   . Hypertension   . Long term current use of anticoagulant 07/03/2010  . Pulmonary embolism ([place])   . Radiculitis of right cervical region 09/13/2016  . Right knee pain 06/25/2017  . Shingles 02/06/2012  . Symptomatic PVCs    Past Surgical History:  Procedure Laterality Date  . TONSILLECTOMY    . VEIN SURGERY     RLE saphenous vein stripping    reports that he has quit smoking. He has never used smokeless tobacco. He reports that he does not drink alcohol or use drugs. family history includes Diabetes in an other family member; Heart attack in his father. Allergies  Allergen Reactions  .  Oxycodone Other (See Comments)    "Just made me feel bad"   Current Outpatient Medications on File Prior to Visit  Medication Sig Dispense Refill  . Butalbital-APAP-Caffeine (FIORICET) 50-300-40 MG CAPS 1 tab by mouth daily as needed for HA 20 capsule 0  . metoprolol succinate (TOPROL-XL) 25 MG 24 hr tablet Take 1 tablet (25 mg total) by mouth at bedtime. Annual appt due in [facility] must see provider for future refills 30 tablet 2  . omeprazole (PRILOSEC) 40 MG capsule Take 1 capsule 40 mg by mouth daily before breakfast. 30 capsule 2  . triamcinolone (NASACORT) 55 MCG/ACT AERO nasal inhaler Place 2 sprays into the nose daily. 1 Inhaler 12  . warfarin (COUMADIN) 4 MG tablet Take 2 tablets daily except take 1 tablet on Wed or AS DIRECTED BY ANTICOAGULATION CLINIC 180 tablet 1   No current facility-administered medications on file prior to visit.   Review of Systems All otherwise neg per pt     Objective:   Physical Exam BP (!) 160/92   Pulse 64   Temp 98 F (36.7 C)   Ht 6\' 4"  (1.93 m)   Wt (!) 303 lb 9.6 oz (137.7 kg)   SpO2 98%   BMI 36.96 kg/m  VS noted,  Constitutional: Pt appears in NAD HENT: Head: NCAT.  Right Ear: External ear normal.  Left Ear: External ear normal.  Eyes: . Pupils are equal, round, and reactive to light. Conjunctivae  and EOM are normal Nose: without d/c or deformity Neck: Neck supple. Gross normal ROM Cardiovascular: Normal rate and regular rhythm.   Pulmonary/Chest: Effort normal and breath sounds without rales or wheezing.  Abd:  Soft, NT, ND, + BS, no organomegaly Neurological: Pt is alert. At baseline orientation, motor grossly intact Skin: Skin is warm. No rashes, other new lesions, no LE edema Psychiatric: Pt behavior is normal without agitation  All otherwise neg per pt Lab Results  Component Value Date   WBC 5.6 08/13/2019   HGB 13.3 08/13/2019   HCT 38.2 (L) 08/13/2019   PLT 199.0 08/13/2019   GLUCOSE 94 08/13/2019   CHOL 190 08/13/2019     TRIG 243.0 (H) 08/13/2019   HDL 35.60 (L) 08/13/2019   LDLDIRECT 92.0 08/13/2019   ALT 17 08/13/2019   AST 14 08/13/2019   NA 138 08/13/2019   K 3.9 08/13/2019   CL 102 08/13/2019   CREATININE 1.03 08/13/2019   BUN 10 08/13/2019   CO2 32 08/13/2019   TSH 3.39 08/13/2019   PSA 1.59 08/13/2019   INR 2.9 07/22/2019   HGBA1C 5.6 08/13/2019         Assessment & Plan:

## 2019-08-13 NOTE — Patient Instructions (Addendum)
Please take all new medication as prescribed - the amlodipine 5 mg per day  Please continue all other medications as before, but you may need to reduce or stop the toprol xl 25 mg if the oximeter shows low heart rates less than 50, especially if you feel dizzy  Please have the pharmacy call with any other refills you may need.  Please continue your efforts at being more active, low cholesterol diet, and weight control.  You are otherwise up to date with prevention measures today.  Please keep your appointments with your specialists as you may have planned  Please go to the LAB at the blood drawing area for the tests to be done  You will be contacted by phone if any changes need to be made immediately.  Otherwise, you will receive a letter about your results with an explanation, but please check with MyChart first.  Please remember to sign up for MyChart if you have not done so, as this will be important to you in the future with finding out test results, communicating by private email, and scheduling acute appointments online when needed.  Please make an Appointment to return in 6 months, or sooner if needed

## 2019-08-14 ENCOUNTER — Encounter: Payer: Self-pay | Admitting: Internal Medicine

## 2019-08-14 ENCOUNTER — Other Ambulatory Visit: Payer: Self-pay | Admitting: Internal Medicine

## 2019-08-14 MED ORDER — VITAMIN B-12 1000 MCG PO TABS: 1000.0000 ug | ORAL_TABLET | Freq: Every day | ORAL | 3 refills | Status: DC

## 2019-08-14 MED ORDER — VITAMIN D (ERGOCALCIFEROL) 1.25 MG (50000 UNIT) PO CAPS: 50000.0000 [IU] | ORAL_CAPSULE | ORAL | 0 refills | Status: DC

## 2019-08-14 NOTE — Assessment & Plan Note (Signed)
stable overall by history and exam, recent data reviewed with pt, and pt to continue medical treatment as before,  to f/u any worsening symptoms or concerns  

## 2019-08-14 NOTE — Assessment & Plan Note (Signed)

## 2019-08-14 NOTE — Assessment & Plan Note (Signed)
Uncontrolled, to add amlodipine 5 qd 

## 2019-08-14 NOTE — Assessment & Plan Note (Addendum)
Etiology unclear, but to check oximetry at home with spells to further assess for lower hR  I spent 22 minutes in preparing to see the patient by review of recent labs, imaging and procedures, obtaining and reviewing separately obtained history, communicating with the patient and family or caregiver, ordering medications, tests or procedures, and documenting clinical information in the EHR including the differential Dx, treatment, and any further evaluation and other management of dizziness, HTN, hyperglycemia, anxiety

## 2019-08-24 ENCOUNTER — Other Ambulatory Visit: Payer: Self-pay | Admitting: General Practice

## 2019-08-24 ENCOUNTER — Ambulatory Visit: Payer: 59 | Admitting: General Practice

## 2019-08-24 ENCOUNTER — Other Ambulatory Visit: Payer: Self-pay | Admitting: Internal Medicine

## 2019-08-24 ENCOUNTER — Other Ambulatory Visit: Payer: Self-pay

## 2019-08-24 DIAGNOSIS — Z8672 Personal history of thrombophlebitis: Secondary | ICD-10-CM | POA: Diagnosis not present

## 2019-08-24 DIAGNOSIS — Z7901 Long term (current) use of anticoagulants: Secondary | ICD-10-CM

## 2019-08-24 LAB — POCT INR: INR: 3.1 — AB (ref 2.0–3.0)

## 2019-08-24 MED ORDER — WARFARIN SODIUM 4 MG PO TABS: ORAL_TABLET | ORAL | 1 refills | Status: DC

## 2019-08-24 NOTE — Progress Notes (Signed)
Medical screening examination/treatment/procedure(s) were performed by non-physician practitioner and as supervising physician I was immediately available for consultation/collaboration. I agree with above. James John, MD   

## 2019-08-24 NOTE — Telephone Encounter (Signed)
Please refill as per office routine med refill policy (all routine meds refilled for 3 mo or monthly per pt preference up to one year from last visit, then month to month grace period for 3 mo, then further med refills will have to be denied)  

## 2019-08-24 NOTE — Patient Instructions (Signed)
Pre visit review using our clinic review tool, if applicable. No additional management support is needed unless otherwise documented below in the visit note.  Take 1 tablet today and then change dosage and take 2 tablets daily except 1 tablet on Wednesdays and Saturdays.   Re-check in 4 weeks

## 2019-09-02 ENCOUNTER — Ambulatory Visit: Payer: 59

## 2019-09-21 ENCOUNTER — Ambulatory Visit: Payer: 59 | Admitting: General Practice

## 2019-09-21 ENCOUNTER — Other Ambulatory Visit: Payer: Self-pay

## 2019-09-21 DIAGNOSIS — Z8672 Personal history of thrombophlebitis: Secondary | ICD-10-CM | POA: Diagnosis not present

## 2019-09-21 LAB — POCT INR: INR: 3.4 — AB (ref 2.0–3.0)

## 2019-09-21 NOTE — Progress Notes (Signed)
Medical screening examination/treatment/procedure(s) were performed by non-physician practitioner and as supervising physician I was immediately available for consultation/collaboration. I agree with above. Kawana Hegel, MD   

## 2019-09-21 NOTE — Patient Instructions (Signed)
Pre visit review using our clinic review tool, if applicable. No additional management support is needed unless otherwise documented below in the visit note.  Skip coumadin today and then take 2 tablets daily except 1 tablet every Wednesday and Saturday.  Re-check in 2 weeks.

## 2019-10-05 ENCOUNTER — Ambulatory Visit: Payer: 59

## 2019-10-12 ENCOUNTER — Ambulatory Visit: Payer: 59 | Admitting: General Practice

## 2019-10-12 ENCOUNTER — Other Ambulatory Visit: Payer: Self-pay

## 2019-10-12 DIAGNOSIS — Z8672 Personal history of thrombophlebitis: Secondary | ICD-10-CM

## 2019-10-12 LAB — POCT INR: INR: 2.2 (ref 2.0–3.0)

## 2019-10-12 NOTE — Patient Instructions (Addendum)
Pre visit review using our clinic review tool, if applicable. No additional management support is needed unless otherwise documented below in the visit note.  Change dosage and take 2 tablets daily except 1 tablet every Wednesday and 1 1/2 tablets on Saturdays.  Re-check in 4 weeks.

## 2019-11-09 ENCOUNTER — Ambulatory Visit: Payer: 59 | Admitting: General Practice

## 2019-11-09 ENCOUNTER — Other Ambulatory Visit: Payer: Self-pay

## 2019-11-09 DIAGNOSIS — Z8672 Personal history of thrombophlebitis: Secondary | ICD-10-CM | POA: Diagnosis not present

## 2019-11-09 LAB — POCT INR: INR: 2.5 (ref 2.0–3.0)

## 2019-11-09 NOTE — Progress Notes (Signed)
Medical screening examination/treatment/procedure(s) were performed by non-physician practitioner and as supervising physician I was immediately available for consultation/collaboration. I agree with above. Dyann Goodspeed, MD   

## 2019-11-09 NOTE — Patient Instructions (Addendum)
Pre visit review using our clinic review tool, if applicable. No additional management support is needed unless otherwise documented below in the visit note.  Continue to take 2 tablets daily except 1 tablet every Wednesday and 1 1/2 tablets on Saturdays.  Re-check in 4 weeks.

## 2019-12-07 ENCOUNTER — Ambulatory Visit: Payer: 59

## 2019-12-14 ENCOUNTER — Ambulatory Visit: Payer: 59 | Admitting: General Practice

## 2019-12-14 ENCOUNTER — Other Ambulatory Visit: Payer: Self-pay

## 2019-12-14 DIAGNOSIS — Z8672 Personal history of thrombophlebitis: Secondary | ICD-10-CM

## 2019-12-14 LAB — POCT INR: INR: 2.4 (ref 2.0–3.0)

## 2019-12-14 NOTE — Patient Instructions (Addendum)
Pre visit review using our clinic review tool, if applicable. No additional management support is needed unless otherwise documented below in the visit note.  Continue to take 2 tablets daily except 1 tablet every Wednesday and 1 1/2 tablets on Saturdays.  Re-check in 6 weeks.  

## 2019-12-14 NOTE — Progress Notes (Signed)
Medical screening examination/treatment/procedure(s) were performed by non-physician practitioner and as supervising physician I was immediately available for consultation/collaboration. I agree with above. Kaianna Dolezal, MD   

## 2020-01-25 ENCOUNTER — Other Ambulatory Visit: Payer: Self-pay

## 2020-01-25 ENCOUNTER — Ambulatory Visit: Payer: 59 | Admitting: General Practice

## 2020-01-25 DIAGNOSIS — Z8672 Personal history of thrombophlebitis: Secondary | ICD-10-CM

## 2020-01-25 LAB — POCT INR: INR: 2.2 (ref 2.0–3.0)

## 2020-01-25 NOTE — Patient Instructions (Addendum)
Pre visit review using our clinic review tool, if applicable. No additional management support is needed unless otherwise documented below in the visit note.  Continue to take 2 tablets daily except 1 tablet every Wednesday and 1 1/2 tablets on Saturdays.  Re-check in 6 weeks.

## 2020-01-26 NOTE — Progress Notes (Signed)
Medical screening examination/treatment/procedure(s) were performed by non-physician practitioner and as supervising physician I was immediately available for consultation/collaboration. I agree with above. Saphyra Hutt, MD   

## 2020-02-14 ENCOUNTER — Ambulatory Visit: Payer: 59 | Admitting: Internal Medicine

## 2020-02-18 ENCOUNTER — Telehealth (INDEPENDENT_AMBULATORY_CARE_PROVIDER_SITE_OTHER): Payer: 59 | Admitting: Internal Medicine

## 2020-02-18 DIAGNOSIS — J019 Acute sinusitis, unspecified: Secondary | ICD-10-CM

## 2020-02-18 DIAGNOSIS — I10 Essential (primary) hypertension: Secondary | ICD-10-CM | POA: Diagnosis not present

## 2020-02-18 DIAGNOSIS — J309 Allergic rhinitis, unspecified: Secondary | ICD-10-CM

## 2020-02-18 DIAGNOSIS — R739 Hyperglycemia, unspecified: Secondary | ICD-10-CM

## 2020-02-18 DIAGNOSIS — E538 Deficiency of other specified B group vitamins: Secondary | ICD-10-CM

## 2020-02-18 DIAGNOSIS — E785 Hyperlipidemia, unspecified: Secondary | ICD-10-CM

## 2020-02-18 DIAGNOSIS — E559 Vitamin D deficiency, unspecified: Secondary | ICD-10-CM

## 2020-02-18 MED ORDER — AZITHROMYCIN 250 MG PO TABS: ORAL_TABLET | ORAL | 0 refills | Status: DC

## 2020-02-18 MED ORDER — MOMETASONE FUROATE 50 MCG/ACT NA SUSP: 2.0000 | Freq: Every day | NASAL | 5 refills | Status: DC

## 2020-02-18 NOTE — Patient Instructions (Signed)
Please take all new medication as prescribed  Please continue all other medications as before, and refills have been done if requested.  Please have the pharmacy call with any other refills you may need.  Please continue your efforts at being more active, low cholesterol diet, and weight control.  You are otherwise up to date with prevention measures today.  Please keep your appointments with your specialists as you may have planned  Please go to the LAB at the blood drawing area for the tests to be done - at the [facility] lab next week  Please make an Appointment to return in 6 months, or sooner if needed

## 2020-02-18 NOTE — Progress Notes (Signed)
Patient ID: Justin Woodard, male   DOB: May 01, 1956, 64 y.o.   MRN: [id number]  Virtual Visit via Video Note  I connected with Justin Woodard on 02/18/20 at  2:40 PM EDT by a video enabled telemedicine application and verified that I am speaking with the correct person using two identifiers.  Location of all participants today Patient: at home Provider: at office   I discussed the limitations of evaluation and management by telemedicine and the availability of in person appointments. The patient expressed understanding and agreed to proceed.  History of Present Illness:  Here with 2-3 days acute onset fever, facial pain, pressure, headache, general weakness and malaise, and greenish d/c, with mild ST and cough, but pt denies chest pain, wheezing, increased sob or doe, orthopnea, PND, increased LE swelling, palpitations, dizziness or syncope.  Does have several wks ongoing nasal allergy symptoms with clearish congestion, itch and sneezing, without fever, pain, ST, cough, swelling or wheezing.  Not taking the b12 or vit d supplements Past Medical History:  Diagnosis Date  . A-fib (HCC)    post PE only  . Anxiety state 04/20/2007   Qualifier: Diagnosis of  By: Jonny Ruiz MD, Len Blalock   . DVT (deep venous thrombosis) (HCC)    2000  . Dysphagia 09/13/2016  . Erectile dysfunction 05/28/2011  . Eustachian tube dysfunction, right 06/25/2017  . GERD (gastroesophageal reflux disease)   . Hypertension   . Long term current use of anticoagulant 07/03/2010  . Pulmonary embolism (HCC)   . Radiculitis of right cervical region 09/13/2016  . Right knee pain 06/25/2017  . Shingles 02/06/2012  . Symptomatic PVCs    Past Surgical History:  Procedure Laterality Date  . TONSILLECTOMY    . VEIN SURGERY     RLE saphenous vein stripping    reports that he has quit smoking. He has never used smokeless tobacco. He reports that he does not drink alcohol and does not use drugs. family history includes Diabetes in an other  family member; Heart attack in his father. Allergies  Allergen Reactions  . Oxycodone Other (See Comments)    "Just made me feel bad"   Current Outpatient Medications on File Prior to Visit  Medication Sig Dispense Refill  . amLODipine (NORVASC) 5 MG tablet Take 1 tablet (5 mg total) by mouth daily. 90 tablet 3  . Butalbital-APAP-Caffeine (FIORICET) 50-300-40 MG CAPS 1 tab by mouth daily as needed for HA 20 capsule 0  . metoprolol succinate (TOPROL-XL) 25 MG 24 hr tablet TAKE 1 TABLET BY MOUTH AT BEDTIME 90 tablet 3  . omeprazole (PRILOSEC) 40 MG capsule Take 1 capsule 40 mg by mouth daily before breakfast. 30 capsule 2  . vitamin B-12 (CYANOCOBALAMIN) 1000 MCG tablet Take 1 tablet (1,000 mcg total) by mouth daily. 90 tablet 3  . warfarin (COUMADIN) 4 MG tablet Take 2 tablets daily except take 1 tablet on Wed or AS DIRECTED BY ANTICOAGULATION CLINIC 180 tablet 1   No current facility-administered medications on file prior to visit.    Observations/Objective: Alert, NAD, appropriate mood and affect, resps normal, cn 2-12 intact, moves all 4s, no visible rash or swelling Lab Results  Component Value Date   WBC 5.6 08/13/2019   HGB 13.3 08/13/2019   HCT 38.2 (L) 08/13/2019   PLT 199.0 08/13/2019   GLUCOSE 94 08/13/2019   CHOL 190 08/13/2019   TRIG 243.0 (H) 08/13/2019   HDL 35.60 (L) 08/13/2019   LDLDIRECT 92.0 08/13/2019   ALT  17 08/13/2019   AST 14 08/13/2019   NA 138 08/13/2019   K 3.9 08/13/2019   CL 102 08/13/2019   CREATININE 1.03 08/13/2019   BUN 10 08/13/2019   CO2 32 08/13/2019   TSH 3.39 08/13/2019   PSA 1.59 08/13/2019   INR 2.2 01/25/2020   HGBA1C 5.6 08/13/2019   Assessment and Plan: See notes  Follow Up Instructions: See notes   I discussed the assessment and treatment plan with the patient. The patient was provided an opportunity to ask questions and all were answered. The patient agreed with the plan and demonstrated an understanding of the  instructions.   The patient was advised to call back or seek an in-person evaluation if the symptoms worsen or if the condition fails to improve as anticipated  Oliver Barre, MD

## 2020-02-19 ENCOUNTER — Encounter: Payer: Self-pay | Admitting: Internal Medicine

## 2020-02-19 NOTE — Assessment & Plan Note (Signed)
To start b12 1000 mcg qd 

## 2020-02-19 NOTE — Assessment & Plan Note (Addendum)
Mild to mod, for antibx course,  to f/u any worsening symptoms or concerns  I spent 31 minutes in preparing to see the patient by review of recent labs, imaging and procedures, obtaining and reviewing separately obtained history, communicating with the patient and family or caregiver, ordering medications, tests or procedures, and documenting clinical information in the EHR including the differential Dx, treatment, and any further evaluation and other management of sinusitis, allergies, b12 and d deficiency, hyperglycemia, htn, hld

## 2020-02-19 NOTE — Assessment & Plan Note (Signed)
For vit d 2000 qd 

## 2020-02-19 NOTE — Assessment & Plan Note (Signed)
stable overall by history and exam, recent data reviewed with pt, and pt to continue medical treatment as before,  to f/u any worsening symptoms or concerns  

## 2020-02-19 NOTE — Assessment & Plan Note (Signed)
Cont to monitor at home, goa at least < 140/90

## 2020-02-19 NOTE — Assessment & Plan Note (Signed)
Mild uncontrolled, to take the nasal steroid daily

## 2020-02-19 NOTE — Assessment & Plan Note (Signed)
Declines statin, for low chol diet, f/u lab next visit

## 2020-02-20 ENCOUNTER — Other Ambulatory Visit: Payer: Self-pay | Admitting: Internal Medicine

## 2020-02-20 DIAGNOSIS — Z7901 Long term (current) use of anticoagulants: Secondary | ICD-10-CM

## 2020-02-20 NOTE — Telephone Encounter (Signed)
Please to have pt reqeust coumadin per coumadin clinic

## 2020-02-21 NOTE — Telephone Encounter (Signed)
Sent to [facility] to advise on med refill for pts Warfarin 4mg  tablet.

## 2020-03-02 ENCOUNTER — Other Ambulatory Visit (INDEPENDENT_AMBULATORY_CARE_PROVIDER_SITE_OTHER): Payer: 59

## 2020-03-02 ENCOUNTER — Encounter: Payer: Self-pay | Admitting: Internal Medicine

## 2020-03-02 DIAGNOSIS — I10 Essential (primary) hypertension: Secondary | ICD-10-CM | POA: Diagnosis not present

## 2020-03-02 DIAGNOSIS — E538 Deficiency of other specified B group vitamins: Secondary | ICD-10-CM | POA: Diagnosis not present

## 2020-03-02 DIAGNOSIS — E559 Vitamin D deficiency, unspecified: Secondary | ICD-10-CM | POA: Diagnosis not present

## 2020-03-02 DIAGNOSIS — R739 Hyperglycemia, unspecified: Secondary | ICD-10-CM | POA: Diagnosis not present

## 2020-03-02 LAB — CBC WITH DIFFERENTIAL/PLATELET
Basophils Absolute: 0 10*3/uL (ref 0.0–0.1)
Basophils Relative: 0.9 % (ref 0.0–3.0)
Eosinophils Absolute: 0.2 10*3/uL (ref 0.0–0.7)
Eosinophils Relative: 3.1 % (ref 0.0–5.0)
HCT: 39.7 % (ref 39.0–52.0)
Hemoglobin: 13.5 g/dL (ref 13.0–17.0)
Lymphocytes Relative: 34.8 % (ref 12.0–46.0)
Lymphs Abs: 1.8 10*3/uL (ref 0.7–4.0)
MCHC: 33.9 g/dL (ref 30.0–36.0)
MCV: 86.8 fl (ref 78.0–100.0)
Monocytes Absolute: 0.4 10*3/uL (ref 0.1–1.0)
Monocytes Relative: 8.2 % (ref 3.0–12.0)
Neutro Abs: 2.7 10*3/uL (ref 1.4–7.7)
Neutrophils Relative %: 53 % (ref 43.0–77.0)
Platelets: 208 10*3/uL (ref 150.0–400.0)
RBC: 4.57 Mil/uL (ref 4.22–5.81)
RDW: 13.6 % (ref 11.5–15.5)
WBC: 5 10*3/uL (ref 4.0–10.5)

## 2020-03-02 LAB — COMPREHENSIVE METABOLIC PANEL
ALT: 21 U/L (ref 0–53)
AST: 17 U/L (ref 0–37)
Albumin: 3.9 g/dL (ref 3.5–5.2)
Alkaline Phosphatase: 61 U/L (ref 39–117)
BUN: 12 mg/dL (ref 6–23)
CO2: 31 mEq/L (ref 19–32)
Calcium: 9.1 mg/dL (ref 8.4–10.5)
Chloride: 103 mEq/L (ref 96–112)
Creatinine, Ser: 1.06 mg/dL (ref 0.40–1.50)
GFR: 72.55 mL/min (ref >60.00)
Glucose, Bld: 90 mg/dL (ref 70–99)
Potassium: 4.4 mEq/L (ref 3.5–5.1)
Sodium: 139 mEq/L (ref 135–145)
Total Bilirubin: 1 mg/dL (ref 0.2–1.2)
Total Protein: 6.9 g/dL (ref 6.0–8.3)

## 2020-03-02 LAB — LIPID PANEL
Cholesterol: 182 mg/dL (ref 0–200)
HDL: 36.4 mg/dL — ABNORMAL LOW (ref >39.00)
NonHDL: 145.63
Total CHOL/HDL Ratio: 5
Triglycerides: 279 mg/dL — ABNORMAL HIGH (ref 0.0–149.0)
VLDL: 55.8 mg/dL — ABNORMAL HIGH (ref 0.0–40.0)

## 2020-03-02 LAB — HEMOGLOBIN A1C: Hgb A1c MFr Bld: 5.9 % (ref 4.6–6.5)

## 2020-03-02 LAB — VITAMIN B12: Vitamin B-12: 641 pg/mL (ref 211–911)

## 2020-03-02 LAB — VITAMIN D 25 HYDROXY (VIT D DEFICIENCY, FRACTURES): VITD: 20.4 ng/mL — ABNORMAL LOW (ref 30.00–100.00)

## 2020-03-02 LAB — LDL CHOLESTEROL, DIRECT: Direct LDL: 98 mg/dL

## 2020-03-02 NOTE — Addendum Note (Signed)
Addended by: Vincenza Hews on: 03/02/2020 03:49 PM   Modules accepted: Orders

## 2020-03-07 ENCOUNTER — Ambulatory Visit: Payer: 59 | Admitting: General Practice

## 2020-03-07 ENCOUNTER — Other Ambulatory Visit: Payer: Self-pay

## 2020-03-07 DIAGNOSIS — Z23 Encounter for immunization: Secondary | ICD-10-CM

## 2020-03-07 DIAGNOSIS — Z8672 Personal history of thrombophlebitis: Secondary | ICD-10-CM

## 2020-03-07 LAB — POCT INR: INR: 2.1 (ref 2.0–3.0)

## 2020-03-07 NOTE — Patient Instructions (Addendum)
Pre visit review using our clinic review tool, if applicable. No additional management support is needed unless otherwise documented below in the visit note. Change dosage and take 2 tablets daily except 1 tablet every Wednesday.  Re-check in 6 weeks.

## 2020-03-07 NOTE — Progress Notes (Signed)
Medical screening examination/treatment/procedure(s) were performed by non-physician practitioner and as supervising physician I was immediately available for consultation/collaboration. I agree with above. Balbina Depace, MD   

## 2020-03-28 ENCOUNTER — Other Ambulatory Visit: Payer: Self-pay

## 2020-03-28 ENCOUNTER — Encounter (HOSPITAL_COMMUNITY): Payer: Self-pay | Admitting: Emergency Medicine

## 2020-03-28 ENCOUNTER — Emergency Department (HOSPITAL_COMMUNITY): Payer: 59

## 2020-03-28 ENCOUNTER — Emergency Department (HOSPITAL_COMMUNITY)
Admission: EM | Admit: 2020-03-28 | Discharge: 2020-03-29 | Disposition: A | Discharge status: Home / Self Care | Payer: 59 | Attending: Emergency Medicine | Admitting: Emergency Medicine

## 2020-03-28 DIAGNOSIS — R1013 Epigastric pain: Secondary | ICD-10-CM | POA: Diagnosis present

## 2020-03-28 DIAGNOSIS — Z87891 Personal history of nicotine dependence: Secondary | ICD-10-CM | POA: Diagnosis not present

## 2020-03-28 DIAGNOSIS — K219 Gastro-esophageal reflux disease without esophagitis: Secondary | ICD-10-CM | POA: Diagnosis not present

## 2020-03-28 DIAGNOSIS — Z7901 Long term (current) use of anticoagulants: Secondary | ICD-10-CM | POA: Diagnosis not present

## 2020-03-28 DIAGNOSIS — R079 Chest pain, unspecified: Secondary | ICD-10-CM | POA: Insufficient documentation

## 2020-03-28 DIAGNOSIS — Z79899 Other long term (current) drug therapy: Secondary | ICD-10-CM | POA: Insufficient documentation

## 2020-03-28 DIAGNOSIS — I10 Essential (primary) hypertension: Secondary | ICD-10-CM | POA: Diagnosis not present

## 2020-03-28 DIAGNOSIS — I4891 Unspecified atrial fibrillation: Secondary | ICD-10-CM | POA: Insufficient documentation

## 2020-03-28 LAB — BASIC METABOLIC PANEL
Anion gap: 9 (ref 5–15)
BUN: 11 mg/dL (ref 8–23)
CO2: 26 mmol/L (ref 22–32)
Calcium: 9.3 mg/dL (ref 8.9–10.3)
Chloride: 101 mmol/L (ref 98–111)
Creatinine, Ser: 1.07 mg/dL (ref 0.61–1.24)
GFR, Estimated: >60 mL/min (ref >60)
Glucose, Bld: 114 mg/dL — ABNORMAL HIGH (ref 70–99)
Potassium: 4.4 mmol/L (ref 3.5–5.1)
Sodium: 136 mmol/L (ref 135–145)

## 2020-03-28 LAB — CBC
HCT: 41.4 % (ref 39.0–52.0)
Hemoglobin: 13.6 g/dL (ref 13.0–17.0)
MCH: 29 pg (ref 26.0–34.0)
MCHC: 32.9 g/dL (ref 30.0–36.0)
MCV: 88.3 fL (ref 80.0–100.0)
Platelets: 237 10*3/uL (ref 150–400)
RBC: 4.69 MIL/uL (ref 4.22–5.81)
RDW: 13 % (ref 11.5–15.5)
WBC: 5.4 10*3/uL (ref 4.0–10.5)
nRBC: 0 % (ref 0.0–0.2)

## 2020-03-28 LAB — URINALYSIS, ROUTINE W REFLEX MICROSCOPIC
Bilirubin Urine: NEGATIVE
Glucose, UA: NEGATIVE mg/dL
Hgb urine dipstick: NEGATIVE
Ketones, ur: NEGATIVE mg/dL
Leukocytes,Ua: NEGATIVE
Nitrite: NEGATIVE
Protein, ur: NEGATIVE mg/dL
Specific Gravity, Urine: 1.006 (ref 1.005–1.030)
pH: 6 (ref 5.0–8.0)

## 2020-03-28 LAB — HEPATIC FUNCTION PANEL
ALT: 24 U/L (ref 0–44)
AST: 21 U/L (ref 15–41)
Albumin: 3.7 g/dL (ref 3.5–5.0)
Alkaline Phosphatase: 64 U/L (ref 38–126)
Bilirubin, Direct: 0.2 mg/dL (ref 0.0–0.2)
Indirect Bilirubin: 0.9 mg/dL (ref 0.3–0.9)
Total Bilirubin: 1.1 mg/dL (ref 0.3–1.2)
Total Protein: 6.7 g/dL (ref 6.5–8.1)

## 2020-03-28 LAB — TROPONIN I (HIGH SENSITIVITY)
Troponin I (High Sensitivity): 3 ng/L (ref <18)
Troponin I (High Sensitivity): 4 ng/L (ref <18)

## 2020-03-28 LAB — PROTIME-INR
INR: 1.9 — ABNORMAL HIGH (ref 0.8–1.2)
Prothrombin Time: 21 seconds — ABNORMAL HIGH (ref 11.4–15.2)

## 2020-03-28 LAB — LIPASE, BLOOD: Lipase: 33 U/L (ref 11–51)

## 2020-03-28 MED ORDER — IOHEXOL 300 MG/ML  SOLN
125.0000 mL | Freq: Once | INTRAMUSCULAR | Status: AC | PRN
  Administered 2020-03-28: 125 mL via INTRAVENOUS

## 2020-03-28 MED ORDER — SODIUM CHLORIDE 0.9 % IV BOLUS
1000.0000 mL | Freq: Once | INTRAVENOUS | Status: AC
  Administered 2020-03-28: 1000 mL via INTRAVENOUS

## 2020-03-28 NOTE — ED Triage Notes (Signed)
Patient arrives to ED withy complaints of LUQ abdominal pain described as a spasm that radiates to chest and bilateral shoulders that started yesterday and again today. Each episode last two hours. Pt states intolernce to heat/cold with chills during episode.

## 2020-03-28 NOTE — ED Provider Notes (Signed)
[facility] EMERGENCY DEPARTMENT Provider Note   CSN: [id number] Arrival date & time: 03/28/20  1814     History Chief Complaint  Patient presents with  . Abdominal Pain  . Chest Pain    Justin Woodard is a 64 y.o. male hx of A. fib on Coumadin, hypertension who presented with epigastric pain and chest pain.  Patient states that since yesterday, he has left-sided abdominal pain that radiates to his chest.  He states that the episode lasts about 2 to 3 hours.  He states that he feels hot and cold during the episodes.  Patient states that he is taking his medicines as prescribed.  Denies any fevers or chills.  Denies any previous abdominal surgeries.  The history is provided by the patient.       Past Medical History:  Diagnosis Date  . A-fib (HCC)    post PE only  . Anxiety state 04/20/2007   Qualifier: Diagnosis of  By: Jonny Ruiz MD, Len Blalock   . DVT (deep venous thrombosis) (HCC)    2000  . Dysphagia 09/13/2016  . Erectile dysfunction 05/28/2011  . Eustachian tube dysfunction, right 06/25/2017  . GERD (gastroesophageal reflux disease)   . Hypertension   . Long term current use of anticoagulant 07/03/2010  . Pulmonary embolism (HCC)   . Radiculitis of right cervical region 09/13/2016  . Right knee pain 06/25/2017  . Shingles 02/06/2012  . Symptomatic PVCs     Patient Active Problem List   Diagnosis Date Noted  . HLD (hyperlipidemia) 02/18/2020  . B12 deficiency 02/18/2020  . Vitamin D deficiency 02/18/2020  . Dizziness 08/13/2019  . Migraine 07/29/2019  . Near syncope 12/11/2018  . Cough 01/26/2018  . Right knee pain 06/25/2017  . Eustachian tube dysfunction, right 06/25/2017  . Allergic rhinitis 06/25/2017  . Sinusitis 11/29/2016  . Dysphagia 09/13/2016  . Radiculitis of right cervical region 09/13/2016  . Hyperglycemia 08/08/2015  . Encounter for therapeutic drug monitoring 06/22/2013  . Shingles 02/06/2012  . Erectile dysfunction 05/28/2011  .  Encounter for well adult exam with abnormal findings 05/25/2011  . Long term current use of anticoagulant 07/03/2010  . Anxiety state 04/20/2007  . Essential hypertension 04/20/2007  . PE 04/20/2007  . ATRIAL FIBRILLATION 04/20/2007  . PREMATURE VENTRICULAR CONTRACTIONS 04/20/2007  . GERD 04/20/2007  . CHEST PAIN 04/20/2007  . Personal history of venous thrombosis and embolism 04/20/2007  . DEEP VENOUS THROMBOPHLEBITIS, HX OF 04/20/2007    Past Surgical History:  Procedure Laterality Date  . TONSILLECTOMY    . VEIN SURGERY     RLE saphenous vein stripping       Family History  Problem Relation Age of Onset  . Heart attack Father   . Diabetes Other        first degree relatives    Social History   Tobacco Use  . Smoking status: Former Games developer  . Smokeless tobacco: Never Used  Substance Use Topics  . Alcohol use: No    Alcohol/week: 0.0 standard drinks  . Drug use: No    Home Medications Prior to Admission medications   Medication Sig Start Date End Date Taking? Authorizing Provider  amLODipine (NORVASC) 5 MG tablet Take 1 tablet (5 mg total) by mouth daily. 08/13/19   Corwin Levins, MD  azithromycin [facility]) 250 MG tablet 2 tab by mouth day 1, the 1 per day 02/18/20   Corwin Levins, MD  Butalbital-APAP-Caffeine (FIORICET) 50-300-40 MG CAPS 1 tab by  mouth daily as needed for HA 07/29/19   Corwin Levins, MD  metoprolol succinate (TOPROL-XL) 25 MG 24 hr tablet TAKE 1 TABLET BY MOUTH AT BEDTIME 08/24/19   Corwin Levins, MD  mometasone (NASONEX) 50 MCG/ACT nasal spray Place 2 sprays into the nose daily. 02/18/20 02/17/21  Corwin Levins, MD  omeprazole (PRILOSEC) 40 MG capsule Take 1 capsule 40 mg by mouth daily before breakfast. 08/24/18   Unk Lightning, PA  vitamin B-12 (CYANOCOBALAMIN) 1000 MCG tablet Take 1 tablet (1,000 mcg total) by mouth daily. 08/14/19   Corwin Levins, MD  warfarin (COUMADIN) 4 MG tablet TAKE 2 TABLETS BY MOUTH DAILY, EXCEPT 1 TABLET ON [facility]  OR AS DIRECTED BY ANTICOAGULATION CLINIC 02/21/20   Corwin Levins, MD    Allergies    Oxycodone  Review of Systems   Review of Systems  Cardiovascular: Positive for chest pain.  Gastrointestinal: Positive for abdominal pain.  All other systems reviewed and are negative.   Physical Exam Updated Vital Signs BP 139/90 (BP Location: Right Arm)   Pulse 77   Temp 99.6 F (37.6 C) (Oral)   Resp 16   Ht 6\' 4"  (1.93 m)   Wt 136.1 kg   SpO2 98%   BMI 36.52 kg/m   Physical Exam Vitals and nursing note reviewed.  HENT:     Head: Normocephalic.     Mouth/Throat:     Mouth: Mucous membranes are moist.  Eyes:     Extraocular Movements: Extraocular movements intact.  Cardiovascular:     Rate and Rhythm: Normal rate and regular rhythm.     Heart sounds: Normal heart sounds.  Pulmonary:     Effort: Pulmonary effort is normal.     Breath sounds: Normal breath sounds.  Abdominal:     General: Abdomen is flat.     Comments: Mild epigastric and left upper quadrant tenderness.  Skin:    General: Skin is warm.     Capillary Refill: Capillary refill takes less than 2 seconds.  Neurological:     General: No focal deficit present.     Mental Status: He is alert and oriented to person, place, and time.  Psychiatric:        Mood and Affect: Mood normal.        Behavior: Behavior normal.     ED Results / Procedures / Treatments   Labs (all labs ordered are listed, but only abnormal results are displayed) Labs Reviewed  BASIC METABOLIC PANEL - Abnormal; Notable for the following components:      Result Value   Glucose, Bld 114 (*)    All other components within normal limits  CBC  LIPASE, BLOOD  HEPATIC FUNCTION PANEL  TROPONIN I (HIGH SENSITIVITY)  TROPONIN I (HIGH SENSITIVITY)    EKG EKG Interpretation  Date/Time:  Tuesday March 28 2020 18:16:53 EST Ventricular Rate:  80 PR Interval:  252 QRS Duration: 98 QT Interval:  390 QTC Calculation: 449 R Axis:   43 Text  Interpretation: Sinus rhythm with 1st degree A-V block Otherwise normal ECG No significant change since last tracing Confirmed by [id number] [phone]) on 03/28/2020 9:22:46 PM   Radiology DG Chest 2 View  Result Date: 03/28/2020 CLINICAL DATA:  Chest pain and shortness of breath EXAM: CHEST - 2 VIEW COMPARISON:  November 04, 2018 FINDINGS: The lungs are clear. The heart size and pulmonary vascularity are normal. No adenopathy. There is degenerative change in the thoracic spine. No  pneumothorax. IMPRESSION: Lungs clear.  Cardiac silhouette normal. Electronically Signed   By: Bretta Bang III M.D.   On: 03/28/2020 19:10    Procedures Procedures (including critical care time)  Medications Ordered in ED Medications  sodium chloride 0.9 % bolus 1,000 mL (has no administration in time range)    ED Course  I have reviewed the triage vital signs and the nursing notes.  Pertinent labs & imaging results that were available during my care of the patient were reviewed by me and considered in my medical decision making (see chart for details).    MDM Rules/Calculators/A&P                          Justin Woodard is a 64 y.o. male here presenting with abdominal pain and chest pain.  Likely cramps versus ACS versus pancreatitis versus biliary colic.  Plan to get CBC, CMP, lipase, troponin x2, CT abdomen pelvis.  11:48 PM  Labs unremarkable.  CT abdomen pelvis pending. Signed out to Dr. Nino Parsley and anticipate discharge home if CT negative  Final Clinical Impression(s) / ED Diagnoses Final diagnoses:  None    Rx / DC Orders ED Discharge Orders    None       Charlynne Pander, MD 03/28/20 2348

## 2020-03-29 ENCOUNTER — Encounter: Payer: Self-pay | Admitting: Internal Medicine

## 2020-03-29 MED ORDER — PANTOPRAZOLE SODIUM 40 MG PO TBEC
40.0000 mg | DELAYED_RELEASE_TABLET | Freq: Once | ORAL | Status: AC
  Administered 2020-03-29: 40 mg via ORAL
  Filled 2020-03-29: qty 1

## 2020-03-29 NOTE — ED Provider Notes (Signed)
Care assumed from Dr. Silverio Lay, patient with abdominal pain and normal labs pending CT abdomen and pelvis.  CT scan shows no acute process.  Patient tells me that he had stopped taking his omeprazole.  This is likely recurrence of gastritis or GERD.  He is given a dose of pantoprazole and is advised to resume taking his omeprazole.  Follow-up with PCP.  Return precautions discussed.  Results for orders placed or performed during the hospital encounter of 03/28/20  Basic metabolic panel  Result Value Ref Range   Sodium 136 135 - 145 mmol/L   Potassium 4.4 3.5 - 5.1 mmol/L   Chloride 101 98 - 111 mmol/L   CO2 26 22 - 32 mmol/L   Glucose, Bld 114 (H) 70 - 99 mg/dL   BUN 11 8 - 23 mg/dL   Creatinine, Ser 2.77 0.61 - 1.24 mg/dL   Calcium 9.3 8.9 - 82.4 mg/dL   GFR, Estimated >23 >53 mL/min   Anion gap 9 5 - 15  CBC  Result Value Ref Range   WBC 5.4 4.0 - 10.5 K/uL   RBC 4.69 4.22 - 5.81 MIL/uL   Hemoglobin 13.6 13.0 - 17.0 g/dL   HCT 61.4 39 - 52 %   MCV 88.3 80.0 - 100.0 fL   MCH 29.0 26.0 - 34.0 pg   MCHC 32.9 30.0 - 36.0 g/dL   RDW 43.1 54.0 - 08.6 %   Platelets 237 150 - 400 K/uL   nRBC 0.0 0.0 - 0.2 %  Lipase, blood  Result Value Ref Range   Lipase 33 11 - 51 U/L  Hepatic function panel  Result Value Ref Range   Total Protein 6.7 6.5 - 8.1 g/dL   Albumin 3.7 3.5 - 5.0 g/dL   AST 21 15 - 41 U/L   ALT 24 0 - 44 U/L   Alkaline Phosphatase 64 38 - 126 U/L   Total Bilirubin 1.1 0.3 - 1.2 mg/dL   Bilirubin, Direct 0.2 0.0 - 0.2 mg/dL   Indirect Bilirubin 0.9 0.3 - 0.9 mg/dL  Protime-INR  Result Value Ref Range   Prothrombin Time 21.0 (H) 11.4 - 15.2 seconds   INR 1.9 (H) 0.8 - 1.2  Urinalysis, Routine w reflex microscopic Urine, Clean Catch  Result Value Ref Range   Color, Urine STRAW (A) YELLOW   APPearance CLEAR CLEAR   Specific Gravity, Urine 1.006 1.005 - 1.030   pH 6.0 5.0 - 8.0   Glucose, UA NEGATIVE NEGATIVE mg/dL   Hgb urine dipstick NEGATIVE NEGATIVE   Bilirubin  Urine NEGATIVE NEGATIVE   Ketones, ur NEGATIVE NEGATIVE mg/dL   Protein, ur NEGATIVE NEGATIVE mg/dL   Nitrite NEGATIVE NEGATIVE   Leukocytes,Ua NEGATIVE NEGATIVE  Troponin I (High Sensitivity)  Result Value Ref Range   Troponin I (High Sensitivity) 3 <18 ng/L  Troponin I (High Sensitivity)  Result Value Ref Range   Troponin I (High Sensitivity) 4 <18 ng/L   DG Chest 2 View  Result Date: 03/28/2020 CLINICAL DATA:  Chest pain and shortness of breath EXAM: CHEST - 2 VIEW COMPARISON:  November 04, 2018 FINDINGS: The lungs are clear. The heart size and pulmonary vascularity are normal. No adenopathy. There is degenerative change in the thoracic spine. No pneumothorax. IMPRESSION: Lungs clear.  Cardiac silhouette normal. Electronically Signed   By: Bretta Bang III M.D.   On: 03/28/2020 19:10   CT ABDOMEN PELVIS W CONTRAST  Result Date: 03/28/2020 CLINICAL DATA:  Left upper quadrant abdominal pain, abdominal distension,  gastroesophageal reflux disease EXAM: CT ABDOMEN AND PELVIS WITH CONTRAST TECHNIQUE: Multidetector CT imaging of the abdomen and pelvis was performed using the standard protocol following bolus administration of intravenous contrast. CONTRAST:  OMNIPAQUE IOHEXOL 300 MG/ML  SOLN COMPARISON:  None. FINDINGS: Lower chest: The visualized lung bases are clear bilaterally. The visualized heart and pericardium are unremarkable. Small hiatal hernia. Hepatobiliary: Scattered cysts noted within the inferior left and right hepatic lobes. Liver otherwise unremarkable. No intra or extrahepatic biliary ductal dilation. Gallbladder unremarkable. Pancreas: Unremarkable Spleen: Unremarkable Adrenals/Urinary Tract: Adrenal glands are unremarkable. Kidneys are normal, without renal calculi, focal lesion, or hydronephrosis. Bladder is unremarkable. Stomach/Bowel: Moderate sigmoid diverticulosis. Large and small bowel are otherwise unremarkable. Stomach unremarkable. Appendix normal. No free  intraperitoneal gas or fluid. Small fat containing umbilical hernia noted. Vascular/Lymphatic: Moderate aortoiliac atherosclerotic calcification. No aortic aneurysm. Numerous venous varicosities are noted with at the saphenofemoral junctions bilaterally, possibly the result of SFJ incompetence and venous insufficiency. No pathologic adenopathy within the abdomen and pelvis. Reproductive: Prostate is unremarkable. Other: Rectum unremarkable. Musculoskeletal: Degenerative changes are seen within the lumbar spine. Remote appearing superior endplate fracture of L2 noted. No acute bone abnormality. IMPRESSION: Small hiatal hernia. No definite radiographic explanation for the patient's reported symptoms. Numerous varicosities within the inguinal regions bilaterally likely related to saphenofemoral junction reflux and lower extremity venous insufficiency. This could be better assessed with venous insufficiency duplex sonography if indicated. Moderate sigmoid diverticulosis. Aortic Atherosclerosis (ICD10-I70.0). Electronically Signed   By: Helyn Numbers MD   On: 03/28/2020 23:47   Images viewed by me.    Dione Booze, MD 03/29/20 Jacinta Shoe

## 2020-03-29 NOTE — Discharge Instructions (Signed)
Start taking your omeprazole every day for at least the next three weeks.  Return if symptoms are getting worse.

## 2020-03-30 NOTE — Telephone Encounter (Signed)
Please advise 

## 2020-04-18 ENCOUNTER — Other Ambulatory Visit: Payer: Self-pay

## 2020-04-18 ENCOUNTER — Ambulatory Visit: Payer: 59 | Admitting: General Practice

## 2020-04-18 DIAGNOSIS — Z8672 Personal history of thrombophlebitis: Secondary | ICD-10-CM

## 2020-04-18 LAB — POCT INR: INR: 2.4 (ref 2.0–3.0)

## 2020-04-18 NOTE — Patient Instructions (Addendum)
Pre visit review using our clinic review tool, if applicable. No additional management support is needed unless otherwise documented below in the visit note.  Continue to take 2 tablets daily except 1 tablet every Wednesday.  Re-check in 6 weeks.

## 2020-04-18 NOTE — Progress Notes (Signed)
Medical screening examination/treatment/procedure(s) were performed by non-physician practitioner and as supervising physician I was immediately available for consultation/collaboration. I agree with above. Anmol Paschen, MD   

## 2020-04-19 ENCOUNTER — Telehealth: Payer: Self-pay | Admitting: Internal Medicine

## 2020-04-19 MED ORDER — OMEPRAZOLE 40 MG PO CPDR
DELAYED_RELEASE_CAPSULE | ORAL | 1 refills | Status: DC
Start: 2020-04-19 — End: 2021-09-27

## 2020-04-19 NOTE — Telephone Encounter (Signed)
Ok done erx 

## 2020-05-30 ENCOUNTER — Other Ambulatory Visit: Payer: Self-pay

## 2020-05-30 ENCOUNTER — Ambulatory Visit (INDEPENDENT_AMBULATORY_CARE_PROVIDER_SITE_OTHER): Payer: Medicare Other | Admitting: General Practice

## 2020-05-30 DIAGNOSIS — Z8672 Personal history of thrombophlebitis: Secondary | ICD-10-CM | POA: Diagnosis not present

## 2020-05-30 LAB — POCT INR: INR: 2.1 (ref 2.0–3.0)

## 2020-05-30 NOTE — Patient Instructions (Addendum)
Pre visit review using our clinic review tool, if applicable. No additional management support is needed unless otherwise documented below in the visit note.  Change dosage and take 2 tablets daily except 1 1/2 tablet every Wednesday.  Re-check in 6 weeks.

## 2020-05-30 NOTE — Progress Notes (Signed)
Medical screening examination/treatment/procedure(s) were performed by non-physician practitioner and as supervising physician I was immediately available for consultation/collaboration. I agree with above. Egypt Marchiano, MD   

## 2020-06-12 ENCOUNTER — Ambulatory Visit: Payer: 59 | Admitting: Physician Assistant

## 2020-06-15 NOTE — Progress Notes (Unsigned)
Cardiology Office Note Date:  06/15/2020  Patient ID:  Justin Woodard, Justin Woodard [birth year], MRN [id number] PCP:  Corwin Levins, MD  Electrophysiologist:  Dr. Ladona Ridgel   Chief Complaint:  annual visit  History of Present Illness: Justin Woodard is a 65 y.o. male with history of AF associated with DVT/PE (2000), HTN, GERD, PVCs.  He comes today to be seen for Dr. Ladona Ridgel, last seen by him July 2020.  At that visit had an event of some altered mentation a few weeks prior, associated with high BP, no syncope, evaluated at the ER and discharged.  No symptoms of AFib.  No changes were made.   I saw him Jan 2021 Recently had some flu like symptoms, negative for COVID in Dec, more recently seeing GI for some dysphagia, did not want to pursue EGD not wanting to interrupt his a/c, so planned for barium swallow. He is doing well.  Works at [employer], mostly with Animator. On his feet part of his day, more seated these days.  He does not exercise.  He denies any kind of cardiac awareness, no CP or palpitations.  He denies any SOB or DOE with his ADLs, no difficulties.  No dizzy spells near syncope or syncope. He mentions he has had bad veins in his legs since his youth and c/o a constant aching/heaviness in his legs, both at rest and with ambulation.  Ambulation does not necessarily cause worsening and does not need to stop walking.  Asks who he should see for this. Says years ago he spent most of his day at work on his feet, though has had "bad veins" even when he was much younger. He denies any bleeding or signs of bleeding, warfarin is monitored and managed by his PMD/coumadin clinic He has a annual visit with his PMD in march, will be getting his lipids done He was given information for [facility], planned for annual visit  03/28/20 had ER visit with L belly pain, suspect to be gastritis and instructed to resume his ompeprazole   TODAY He is doing OK Retired this month,  both he and his wife. He reports slowing over the years, this a slow steady process. He has epigastric pain that the omeprazole definitely helps though makes him feel poorly in general, has a hard time describing the side effect. No CP or palpitations.  Sometimes he has an awareness of his heart beat, but not fast, not irregulat. No near syncope or syncope. No bleeding or signs of bleeding. Ever since his PE years ago he has never felt like his breathing was the same. Randomly will feel like he is a bit breathless, lasts only a few seconds.  Not changes over the years.  PMD manages his warfarin  Very sedentary He says his varicose veins limit how much he can do in the way of exercise.  Did not want to pursue vascular evaluation, his PE occurred after SVG intervention, and is weary to consider again.  Past Medical History:  Diagnosis Date  . A-fib (HCC)    post PE only  . Anxiety state 04/20/2007   Qualifier: Diagnosis of  By: Jonny Ruiz MD, Len Blalock   . DVT (deep venous thrombosis) (HCC)    2000  . Dysphagia 09/13/2016  . Erectile dysfunction 05/28/2011  . Eustachian tube dysfunction, right 06/25/2017  . GERD (gastroesophageal reflux disease)   . Hypertension   . Long term current use of anticoagulant 07/03/2010  .  Pulmonary embolism (HCC)   . Radiculitis of right cervical region 09/13/2016  . Right knee pain 06/25/2017  . Shingles 02/06/2012  . Symptomatic PVCs     Past Surgical History:  Procedure Laterality Date  . TONSILLECTOMY    . VEIN SURGERY     RLE saphenous vein stripping    Current Outpatient Medications  Medication Sig Dispense Refill  . amLODipine (NORVASC) 5 MG tablet Take 1 tablet (5 mg total) by mouth daily. 90 tablet 3  . Butalbital-APAP-Caffeine (FIORICET) 50-300-40 MG CAPS 1 tab by mouth daily as needed for HA 20 capsule 0  . metoprolol succinate (TOPROL-XL) 25 MG 24 hr tablet TAKE 1 TABLET BY MOUTH AT BEDTIME 90 tablet 3  . mometasone (NASONEX) 50 MCG/ACT nasal spray  Place 2 sprays into the nose daily. 17 g 5  . omeprazole (PRILOSEC) 40 MG capsule Take 1 capsule 40 mg by mouth daily before breakfast. 90 capsule 1  . vitamin B-12 (CYANOCOBALAMIN) 1000 MCG tablet Take 1 tablet (1,000 mcg total) by mouth daily. 90 tablet 3  . warfarin (COUMADIN) 4 MG tablet TAKE 2 TABLETS BY MOUTH DAILY, EXCEPT 1 TABLET ON WEDNESDAY OR AS DIRECTED BY ANTICOAGULATION CLINIC 180 tablet 1   No current facility-administered medications for this visit.    Allergies:   Oxycodone   Social History:  The patient  reports that he has quit smoking. He has never used smokeless tobacco. He reports that he does not drink alcohol and does not use drugs.   Family History:  The patient's family history includes Diabetes in an other family member; Heart attack in his father.  ROS:  Please see the history of present illness.All other systems are reviewed and otherwise negative.   PHYSICAL EXAM:  VS:  There were no vitals taken for this visit. BMI: There is no height or weight on file to calculate BMI. Well nourished, well developed, in no acute distress  HEENT: normocephalic, atraumatic  Neck: no JVD, carotid bruits or masses Cardiac:  RRR; no significant murmurs, no rubs, or gallops Lungs:  CTA b/l, no wheezing, rhonchi or rales  Abd: soft, non tender, obese MS: no deformity atrophy Ext:  trace edema b/l (reported as chronicR>L<  and at baseline with known severe varicose veins), significant spider veins in his feet. Skin: warm and dry, no rash Neuro:  No gross deficits appreciated Psych: euthymic mood, full affect   EKG: not done today 03/28/20 reviewed SR 80bpm, no ischemic changes  08/02/16 TTE Study Conclusions - Left ventricle: The cavity size was normal. Wall thickness was increased in a pattern of mild LVH. Systolic function was normal. The estimated ejection fraction was in the range of 60% to 65%. Wall motion was normal; there were no regional wall motion  abnormalities. - Left atrium: The atrium was mildly dilated. - Right atrium: The atrium was mildly dilated.   Recent Labs: 08/13/2019: TSH 3.39 03/28/2020: ALT 24; BUN 11; Creatinine, Ser 1.07; Hemoglobin 13.6; Platelets 237; Potassium 4.4; Sodium 136  03/02/2020: Cholesterol 182; Direct LDL 98.0; HDL 36.40; Total CHOL/HDL Ratio 5; Triglycerides 279.0; VLDL 55.8   CrCl cannot be calculated (Patient's most recent lab result is older than the maximum 21 days allowed.).   Wt Readings from Last 3 Encounters:  03/28/20 300 lb (136.1 kg)  08/13/19 (!) 303 lb 9.6 oz (137.7 kg)  06/18/19 299 lb (135.6 kg)     Other studies reviewed: Additional studies/records reviewed today include: summarized above  ASSESSMENT AND PLAN:  1.  Paroxysmal AFib     CHA2DS2Vasc is 2, on warfarin also w/hx of 2/2 DVT/PE hx     No symptoms to suggest recurrent AF  2. HTN      Discussed today      Encouraged stationary bike pedals for exercise, and encouraged regular exercise and weight loss      He will monitor at home and let us know if regularly >140/80,    Disposition: we will continue to see him annually, sooner if needed.  Current medicines are reviewed at length with the patient today.  The patient did not have any concerns regarding medicines.  Norma Fredrickson, PA-C 06/15/2020 6:03 PM     [facility] [address] [place] [address] [phone] (office)  [phone] (fax)

## 2020-06-16 ENCOUNTER — Ambulatory Visit: Payer: Medicare Other | Admitting: Physician Assistant

## 2020-06-16 ENCOUNTER — Other Ambulatory Visit: Payer: Self-pay

## 2020-06-16 ENCOUNTER — Encounter: Payer: Self-pay | Admitting: Physician Assistant

## 2020-06-16 VITALS — BP 140/78 | HR 66 | Ht 75.0 in | Wt 299.0 lb

## 2020-06-16 DIAGNOSIS — I10 Essential (primary) hypertension: Secondary | ICD-10-CM | POA: Diagnosis not present

## 2020-06-16 DIAGNOSIS — I480 Paroxysmal atrial fibrillation: Secondary | ICD-10-CM | POA: Diagnosis not present

## 2020-06-16 NOTE — Patient Instructions (Signed)
  Medication Instructions:   Your physician recommends that you continue on your current medications as directed. Please refer to the Current Medication list given to you today. *If you need a refill on your cardiac medications before your next appointment, please call your pharmacy*   Lab Work: NONE ORDERED  TODAY   If you have labs (blood work) drawn today and your tests are completely normal, you will receive your results only by: Marland Kitchen MyChart Message (if you have MyChart) OR . A paper copy in the mail If you have any lab test that is abnormal or we need to change your treatment, we will call you to review the results.   Testing/Procedures: NONE ORDERED  TODAY   Follow-Up: At [facility], you and your health needs are our priority.  As part of our continuing mission to provide you with exceptional heart care, we have created designated Provider Care Teams.  These Care Teams include your primary Cardiologist (physician) and Advanced Practice Providers (APPs -  Physician Assistants and Nurse Practitioners) who all work together to provide you with the care you need, when you need it.  We recommend signing up for the patient portal called "MyChart".  Sign up information is provided on this After Visit Summary.  MyChart is used to connect with patients for Virtual Visits (Telemedicine).  Patients are able to view lab/test results, encounter notes, upcoming appointments, etc.  Non-urgent messages can be sent to your provider as well.   To learn more about what you can do with MyChart, go to [email or web address].    Your next appointment:   1 year(s)  The format for your next appointment:   In Person  Provider:   Lewayne Bunting, MD   Other Instructions  CALL CLINIC IF BLOOD PRESSURE CONSISENTLY IS OVER 140/80

## 2020-07-11 ENCOUNTER — Other Ambulatory Visit: Payer: Self-pay

## 2020-07-11 ENCOUNTER — Ambulatory Visit (INDEPENDENT_AMBULATORY_CARE_PROVIDER_SITE_OTHER): Payer: Medicare Other | Admitting: General Practice

## 2020-07-11 DIAGNOSIS — Z8672 Personal history of thrombophlebitis: Secondary | ICD-10-CM

## 2020-07-11 LAB — POCT INR: INR: 2.3 (ref 2.0–3.0)

## 2020-07-11 NOTE — Progress Notes (Signed)
Medical screening examination/treatment/procedure(s) were performed by non-physician practitioner and as supervising physician I was immediately available for consultation/collaboration. I agree with above. Jayliana Valencia, MD   

## 2020-07-11 NOTE — Patient Instructions (Addendum)
Pre visit review using our clinic review tool, if applicable. No additional management support is needed unless otherwise documented below in the visit note.  Continue to take 2 tablets daily except 1 1/2 tablet every Wednesday.  Re-check in 6 weeks.

## 2020-08-08 ENCOUNTER — Other Ambulatory Visit: Payer: Self-pay

## 2020-08-08 MED ORDER — AMLODIPINE BESYLATE 5 MG PO TABS
5.0000 mg | ORAL_TABLET | Freq: Every day | ORAL | 0 refills | Status: DC
Start: 2020-08-08 — End: 2020-09-04

## 2020-08-18 ENCOUNTER — Other Ambulatory Visit: Payer: Self-pay | Admitting: Internal Medicine

## 2020-08-18 DIAGNOSIS — Z7901 Long term (current) use of anticoagulants: Secondary | ICD-10-CM

## 2020-08-18 NOTE — Telephone Encounter (Signed)
Ok for routine metoprolol  But coumadin I still believe normally is refilled per the coumadin clinic

## 2020-08-21 ENCOUNTER — Other Ambulatory Visit: Payer: Self-pay | Admitting: General Practice

## 2020-08-21 DIAGNOSIS — Z7901 Long term (current) use of anticoagulants: Secondary | ICD-10-CM

## 2020-08-21 MED ORDER — WARFARIN SODIUM 4 MG PO TABS: ORAL_TABLET | ORAL | 1 refills | Status: DC

## 2020-08-22 ENCOUNTER — Other Ambulatory Visit: Payer: Self-pay

## 2020-08-22 ENCOUNTER — Ambulatory Visit (INDEPENDENT_AMBULATORY_CARE_PROVIDER_SITE_OTHER): Payer: Medicare Other | Admitting: General Practice

## 2020-08-22 DIAGNOSIS — Z8672 Personal history of thrombophlebitis: Secondary | ICD-10-CM

## 2020-08-22 LAB — POCT INR: INR: 2.5 (ref 2.0–3.0)

## 2020-08-22 NOTE — Patient Instructions (Addendum)
Pre visit review using our clinic review tool, if applicable. No additional management support is needed unless otherwise documented below in the visit note.  Continue to take 2 tablets daily except 1 1/2 tablet every Wednesday.  Re-check in 6 weeks.

## 2020-08-22 NOTE — Progress Notes (Signed)
Medical screening examination/treatment/procedure(s) were performed by non-physician practitioner and as supervising physician I was immediately available for consultation/collaboration. I agree with above. Fredna Stricker, MD   

## 2020-08-30 DIAGNOSIS — H2512 Age-related nuclear cataract, left eye: Secondary | ICD-10-CM | POA: Diagnosis not present

## 2020-08-30 DIAGNOSIS — H5203 Hypermetropia, bilateral: Secondary | ICD-10-CM | POA: Diagnosis not present

## 2020-09-04 ENCOUNTER — Other Ambulatory Visit: Payer: Self-pay

## 2020-09-04 MED ORDER — AMLODIPINE BESYLATE 5 MG PO TABS
5.0000 mg | ORAL_TABLET | Freq: Every day | ORAL | 0 refills | Status: DC
Start: 2020-09-04 — End: 2020-10-02

## 2020-09-07 ENCOUNTER — Encounter: Payer: Self-pay | Admitting: Internal Medicine

## 2020-09-07 ENCOUNTER — Other Ambulatory Visit: Payer: Self-pay

## 2020-09-07 ENCOUNTER — Ambulatory Visit (INDEPENDENT_AMBULATORY_CARE_PROVIDER_SITE_OTHER): Payer: Medicare Other | Admitting: Internal Medicine

## 2020-09-07 VITALS — BP 148/98 | HR 69 | Temp 97.9°F | Ht 75.0 in | Wt 299.0 lb

## 2020-09-07 DIAGNOSIS — I700 Atherosclerosis of aorta: Secondary | ICD-10-CM | POA: Diagnosis not present

## 2020-09-07 DIAGNOSIS — I10 Essential (primary) hypertension: Secondary | ICD-10-CM

## 2020-09-07 DIAGNOSIS — E7800 Pure hypercholesterolemia, unspecified: Secondary | ICD-10-CM

## 2020-09-07 DIAGNOSIS — Z0001 Encounter for general adult medical examination with abnormal findings: Secondary | ICD-10-CM

## 2020-09-07 DIAGNOSIS — E538 Deficiency of other specified B group vitamins: Secondary | ICD-10-CM | POA: Diagnosis not present

## 2020-09-07 DIAGNOSIS — M25561 Pain in right knee: Secondary | ICD-10-CM

## 2020-09-07 DIAGNOSIS — E559 Vitamin D deficiency, unspecified: Secondary | ICD-10-CM | POA: Diagnosis not present

## 2020-09-07 DIAGNOSIS — J309 Allergic rhinitis, unspecified: Secondary | ICD-10-CM

## 2020-09-07 DIAGNOSIS — R739 Hyperglycemia, unspecified: Secondary | ICD-10-CM | POA: Diagnosis not present

## 2020-09-07 LAB — CBC WITH DIFFERENTIAL/PLATELET
Basophils Absolute: 0.1 10*3/uL (ref 0.0–0.1)
Basophils Relative: 1.3 % (ref 0.0–3.0)
Eosinophils Absolute: 0.2 10*3/uL (ref 0.0–0.7)
Eosinophils Relative: 3.4 % (ref 0.0–5.0)
HCT: 41.6 % (ref 39.0–52.0)
Hemoglobin: 14.3 g/dL (ref 13.0–17.0)
Lymphocytes Relative: 34.8 % (ref 12.0–46.0)
Lymphs Abs: 1.7 10*3/uL (ref 0.7–4.0)
MCHC: 34.4 g/dL (ref 30.0–36.0)
MCV: 85.4 fl (ref 78.0–100.0)
Monocytes Absolute: 0.4 10*3/uL (ref 0.1–1.0)
Monocytes Relative: 7.6 % (ref 3.0–12.0)
Neutro Abs: 2.6 10*3/uL (ref 1.4–7.7)
Neutrophils Relative %: 52.9 % (ref 43.0–77.0)
Platelets: 220 10*3/uL (ref 150.0–400.0)
RBC: 4.87 Mil/uL (ref 4.22–5.81)
RDW: 13.3 % (ref 11.5–15.5)
WBC: 4.9 10*3/uL (ref 4.0–10.5)

## 2020-09-07 LAB — VITAMIN B12: Vitamin B-12: 1159 pg/mL — ABNORMAL HIGH (ref 211–911)

## 2020-09-07 LAB — URINALYSIS, ROUTINE W REFLEX MICROSCOPIC
Bilirubin Urine: NEGATIVE
Ketones, ur: NEGATIVE
Leukocytes,Ua: NEGATIVE
Nitrite: NEGATIVE
Specific Gravity, Urine: 1.01 (ref 1.000–1.030)
Total Protein, Urine: NEGATIVE
Urine Glucose: NEGATIVE
Urobilinogen, UA: 0.2 (ref 0.0–1.0)
pH: 6 (ref 5.0–8.0)

## 2020-09-07 LAB — HEPATIC FUNCTION PANEL
ALT: 18 U/L (ref 0–53)
AST: 17 U/L (ref 0–37)
Albumin: 3.9 g/dL (ref 3.5–5.2)
Alkaline Phosphatase: 64 U/L (ref 39–117)
Bilirubin, Direct: 0.2 mg/dL (ref 0.0–0.3)
Total Bilirubin: 1.2 mg/dL (ref 0.2–1.2)
Total Protein: 6.7 g/dL (ref 6.0–8.3)

## 2020-09-07 LAB — LIPID PANEL
Cholesterol: 179 mg/dL (ref 0–200)
HDL: 39.5 mg/dL (ref >39.00)
LDL Cholesterol: 102 mg/dL — ABNORMAL HIGH (ref 0–99)
NonHDL: 139.52
Total CHOL/HDL Ratio: 5
Triglycerides: 189 mg/dL — ABNORMAL HIGH (ref 0.0–149.0)
VLDL: 37.8 mg/dL (ref 0.0–40.0)

## 2020-09-07 LAB — BASIC METABOLIC PANEL
BUN: 12 mg/dL (ref 6–23)
CO2: 29 mEq/L (ref 19–32)
Calcium: 9.1 mg/dL (ref 8.4–10.5)
Chloride: 102 mEq/L (ref 96–112)
Creatinine, Ser: 1.08 mg/dL (ref 0.40–1.50)
GFR: 71.29 mL/min (ref >60.00)
Glucose, Bld: 98 mg/dL (ref 70–99)
Potassium: 4.5 mEq/L (ref 3.5–5.1)
Sodium: 139 mEq/L (ref 135–145)

## 2020-09-07 LAB — PSA: PSA: 1.94 ng/mL (ref 0.10–4.00)

## 2020-09-07 LAB — TSH: TSH: 2.9 u[IU]/mL (ref 0.35–4.50)

## 2020-09-07 LAB — HEMOGLOBIN A1C: Hgb A1c MFr Bld: 5.7 % (ref 4.6–6.5)

## 2020-09-07 LAB — VITAMIN D 25 HYDROXY (VIT D DEFICIENCY, FRACTURES): VITD: 27.62 ng/mL — ABNORMAL LOW (ref 30.00–100.00)

## 2020-09-07 MED ORDER — THERA-D 2000 50 MCG (2000 UT) PO TABS: ORAL_TABLET | ORAL | 99 refills | Status: DC

## 2020-09-07 MED ORDER — FEXOFENADINE HCL 180 MG PO TABS: 180.0000 mg | ORAL_TABLET | Freq: Every day | ORAL | 2 refills | Status: DC

## 2020-09-07 MED ORDER — GUAIFENESIN ER 600 MG PO TB12: 1200.0000 mg | ORAL_TABLET | Freq: Two times a day (BID) | ORAL | 1 refills | Status: DC | PRN

## 2020-09-07 MED ORDER — TRIAMCINOLONE ACETONIDE 55 MCG/ACT NA AERO: 2.0000 | INHALATION_SPRAY | Freq: Every day | NASAL | 12 refills | Status: DC

## 2020-09-07 NOTE — Progress Notes (Signed)
Patient ID: Justin Woodard, male   DOB: 12-13-1955, 65 y.o.   MRN: [id number]         Chief Complaint:: wellness exam and htn, low vit d, allergies, right knee pain       HPI:  Justin Woodard is a 65 y.o. male here for wellness exam; declines tdap, pneumovax and colonoscopy; o/w up to date with preventive referrals and immunizations                        Also c/o mild right medial knee pain and swelling, intermittent, 1wk, worse to walk, better to sit.  Nothing else makes better or wrose.  BP has been < 140/90 at home but not checked recently.  No taking Vit D.  Does have several wks ongoing nasal allergy symptoms with clearish congestion, itch and sneezing, without fever, pain, ST, cough, swelling or wheezing.  Pt denies chest pain, increased sob or doe, wheezing, orthopnea, PND, increased LE swelling, palpitations, dizziness or syncope.   Pt denies polydipsia, polyuria, denies new focal neuro s/s.   Pt denies fever, wt loss, night sweats, loss of appetite, or other constitutional symptoms  Retired May 20 2020 Wt Readings from Last 3 Encounters:  09/07/20 299 lb (135.6 kg)  06/16/20 299 lb (135.6 kg)  03/28/20 300 lb (136.1 kg)   BP Readings from Last 3 Encounters:  09/07/20 (!) 148/98  06/16/20 140/78  03/29/20 (!) 160/78   Immunization History  Administered Date(s) Administered  . Fluad Quad(high Dose 65+) 03/07/2020  . Influenza,inj,Quad PF,6+ Mos 06/25/2017, 06/05/2018  . PFIZER(Purple Top)SARS-COV-2 Vaccination 07/26/2019, 08/16/2019   There are no preventive care reminders to display for this patient.    Past Medical History:  Diagnosis Date  . A-fib (HCC)    post PE only  . Anxiety state 04/20/2007   Qualifier: Diagnosis of  By: Jonny Ruiz MD, Len Blalock   . DVT (deep venous thrombosis) (HCC)    2000  . Dysphagia 09/13/2016  . Erectile dysfunction 05/28/2011  . Eustachian tube dysfunction, right 06/25/2017  . GERD (gastroesophageal reflux disease)   . Hypertension   . Long term current  use of anticoagulant 07/03/2010  . Pulmonary embolism (HCC)   . Radiculitis of right cervical region 09/13/2016  . Right knee pain 06/25/2017  . Shingles 02/06/2012  . Symptomatic PVCs    Past Surgical History:  Procedure Laterality Date  . TONSILLECTOMY    . VEIN SURGERY     RLE saphenous vein stripping    reports that he has quit smoking. He has never used smokeless tobacco. He reports that he does not drink alcohol and does not use drugs. family history includes Diabetes in an other family member; Heart attack in his father. Allergies  Allergen Reactions  . Oxycodone Other (See Comments)    "Just made me feel bad"   Current Outpatient Medications on File Prior to Visit  Medication Sig Dispense Refill  . amLODipine (NORVASC) 5 MG tablet Take 1 tablet (5 mg total) by mouth daily. 30 tablet 0  . metoprolol succinate (TOPROL-XL) 25 MG 24 hr tablet TAKE 1 TABLET BY MOUTH AT BEDTIME 90 tablet 3  . omeprazole (PRILOSEC) 40 MG capsule Take 1 capsule 40 mg by mouth daily before breakfast. (Patient taking differently: Take 1 capsule 40 mg by mouth daily before breakfast. Mon wed and fri) 90 capsule 1  . vitamin B-12 (CYANOCOBALAMIN) 1000 MCG tablet Take 1 tablet (1,000 mcg total) by mouth  daily. 90 tablet 3  . warfarin (COUMADIN) 4 MG tablet TAKE 2 TABLETS BY MOUTH DAILY, EXCEPT 1 TABLET ON WEDNESDAY OR AS DIRECTED BY ANTICOAGULATION CLINIC 180 tablet 1   No current facility-administered medications on file prior to visit.        ROS:  All others reviewed and negative.  Objective        PE:  BP (!) 148/98 (BP Location: Left Arm, Patient Position: Sitting, Cuff Size: Large)   Pulse 69   Temp 97.9 F (36.6 C) (Oral)   Ht 6\' 3"  (1.905 m)   Wt 299 lb (135.6 kg)   SpO2 97%   BMI 37.37 kg/m                 Constitutional: Pt appears in NAD               HENT: Head: NCAT.                Right Ear: External ear normal.                 Left Ear: External ear normal.                Eyes: .  Pupils are equal, round, and reactive to light. Conjunctivae and EOM are normal               Nose: without d/c or deformity               Neck: Neck supple. Gross normal ROM               Cardiovascular: Normal rate and regular rhythm.                 Pulmonary/Chest: Effort normal and breath sounds without rales or wheezing.                Abd:  Soft, NT, ND, + BS, no organomegaly               Right medial knee mild tender sweling               Neurological: Pt is alert. At baseline orientation, motor grossly intact               Skin: Skin is warm. No rashes, no other new lesions, LE edema - none               Psychiatric: Pt behavior is normal without agitation   Micro: none  Cardiac tracings I have personally interpreted today:  none  Pertinent Radiological findings (summarize): none   Lab Results  Component Value Date   WBC 4.9 09/07/2020   HGB 14.3 09/07/2020   HCT 41.6 09/07/2020   PLT 220.0 09/07/2020   GLUCOSE 98 09/07/2020   CHOL 179 09/07/2020   TRIG 189.0 (H) 09/07/2020   HDL 39.50 09/07/2020   LDLDIRECT 98.0 03/02/2020   LDLCALC 102 (H) 09/07/2020   ALT 18 09/07/2020   AST 17 09/07/2020   NA 139 09/07/2020   K 4.5 09/07/2020   CL 102 09/07/2020   CREATININE 1.08 09/07/2020   BUN 12 09/07/2020   CO2 29 09/07/2020   TSH 2.90 09/07/2020   PSA 1.94 09/07/2020   INR 2.5 08/22/2020   HGBA1C 5.7 09/07/2020   Assessment/Plan:  Justin Woodard is a 65 y.o. White or Caucasian [1] male with  has a past medical history of A-fib [facility]), Anxiety state (04/20/2007), DVT (deep venous thrombosis) (  HCC), Dysphagia (09/13/2016), Erectile dysfunction (05/28/2011), Eustachian tube dysfunction, right (06/25/2017), GERD (gastroesophageal reflux disease), Hypertension, Long term current use of anticoagulant (07/03/2010), Pulmonary embolism (HCC), Radiculitis of right cervical region (09/13/2016), Right knee pain (06/25/2017), Shingles (02/06/2012), and Symptomatic PVCs.  Encounter for well  adult exam with abnormal findings Age and sex appropriate education and counseling updated with regular exercise and diet Referrals for preventative services - declines colonoscopy Immunizations addressed - declnies tdap and pneumovax Smoking counseling  - none needed Evidence for depression or other mood disorder - none significant Most recent labs reviewed. I have personally reviewed and have noted: 1) the patient's medical and social history 2) The patient's current medications and supplements 3) The patient's height, weight, and BMI have been recorded in the chart   Aortic atherosclerosis (HCC) To continue low chol diet, declines statin  Vitamin D deficiency Last vitamin D Lab Results  Component Value Date   VD25OH 27.62 (L) 09/07/2020   Low to start oral replacement  Hypertension, uncontrolled BP Readings from Last 3 Encounters:  09/07/20 (!) 148/98  06/16/20 140/78  03/29/20 (!) 160/78   Stable, pt to continue medical treatment norvasc, toprol; declines any change today   Hyperglycemia Lab Results  Component Value Date   HGBA1C 5.7 09/07/2020   Stable, pt to continue current medical treatment  - diet   HLD (hyperlipidemia) Lab Results  Component Value Date   LDLCALC 102 (H) 09/07/2020   Stable, pt to continue current low chol diet, declines statin  B12 deficiency Lab Results  Component Value Date   VITAMINB12 1,159 (H) 09/07/2020   Stable, cont oral replacement - b12 1000 mcg qd   Allergic rhinitis Mild to mod, for allegra and nasacort asd, to f/u any worsening symptoms or concerns  Right knee pain Worse in past few weeks, but mild, declines change in tx, to consider sport medicine referral  Followup: Return in about 6 months (around 03/09/2021).  Oliver Barre, MD 09/10/2020 3:17 PM [facility]

## 2020-09-07 NOTE — Patient Instructions (Addendum)
Please continue to monitor your BP at home and call for 10 day average more than 140/90  Please take  OTC Vitamin D3 at 2000 units per day, indefinitely.  Please take all new medication as prescribed - the mucinex, allegra and nasacort for allergies  Please call for chest xray if you are not improved with this   Please consider seeing Sports Medicine for the right knee pain and swelling if things get worse  Please go to the LAB at the blood drawing area for the tests to be done  You will be contacted by phone if any changes need to be made immediately.  Otherwise, you will receive a letter about your results with an explanation, but please check with MyChart first.  Please remember to sign up for MyChart if you have not done so, as this will be important to you in the future with finding out test results, communicating by private email, and scheduling acute appointments online when needed.  Please make an Appointment to return in 6 months, or sooner if needed

## 2020-09-10 ENCOUNTER — Encounter: Payer: Self-pay | Admitting: Internal Medicine

## 2020-09-10 NOTE — Assessment & Plan Note (Addendum)
To continue low chol diet, declines statin

## 2020-09-10 NOTE — Assessment & Plan Note (Signed)
Last vitamin D Lab Results  Component Value Date   VD25OH 27.62 (L) 09/07/2020   Low to start oral replacement

## 2020-09-10 NOTE — Assessment & Plan Note (Signed)
Mild to mod, for allegra and nasacort asd,   to f/u any worsening symptoms or concerns 

## 2020-09-10 NOTE — Assessment & Plan Note (Signed)
Lab Results  Component Value Date   HGBA1C 5.7 09/07/2020   Stable, pt to continue current medical treatment  - diet

## 2020-09-10 NOTE — Assessment & Plan Note (Signed)
BP Readings from Last 3 Encounters:  09/07/20 (!) 148/98  06/16/20 140/78  03/29/20 (!) 160/78   Stable, pt to continue medical treatment norvasc, toprol; declines any change today

## 2020-09-10 NOTE — Assessment & Plan Note (Signed)
Lab Results  Component Value Date   VITAMINB12 1,159 (H) 09/07/2020   Stable, cont oral replacement - b12 1000 mcg qd

## 2020-09-10 NOTE — Assessment & Plan Note (Signed)
Lab Results  Component Value Date   LDLCALC 102 (H) 09/07/2020   Stable, pt to continue current low chol diet, declines statin

## 2020-09-10 NOTE — Assessment & Plan Note (Signed)
Age and sex appropriate education and counseling updated with regular exercise and diet Referrals for preventative services - declines colonoscopy Immunizations addressed - declnies tdap and pneumovax Smoking counseling  - none needed Evidence for depression or other mood disorder - none significant Most recent labs reviewed. I have personally reviewed and have noted: 1) the patient's medical and social history 2) The patient's current medications and supplements 3) The patient's height, weight, and BMI have been recorded in the chart

## 2020-09-10 NOTE — Assessment & Plan Note (Signed)
Worse in past few weeks, but mild, declines change in tx, to consider sport medicine referral

## 2020-10-02 ENCOUNTER — Other Ambulatory Visit: Payer: Self-pay | Admitting: Internal Medicine

## 2020-10-03 ENCOUNTER — Other Ambulatory Visit: Payer: Self-pay

## 2020-10-03 ENCOUNTER — Ambulatory Visit (INDEPENDENT_AMBULATORY_CARE_PROVIDER_SITE_OTHER): Payer: Medicare Other | Admitting: General Practice

## 2020-10-03 DIAGNOSIS — Z8672 Personal history of thrombophlebitis: Secondary | ICD-10-CM | POA: Diagnosis not present

## 2020-10-03 LAB — POCT INR: INR: 2.9 (ref 2.0–3.0)

## 2020-10-03 NOTE — Patient Instructions (Addendum)
Pre visit review using our clinic review tool, if applicable. No additional management support is needed unless otherwise documented below in the visit note.  Change dosage and take 2 tablets daily except take 1/2 tablet every Tuesday.  Re-check in 3 to 4 weeks.

## 2020-10-03 NOTE — Progress Notes (Signed)
Medical screening examination/treatment/procedure(s) were performed by non-physician practitioner and as supervising physician I was immediately available for consultation/collaboration. I agree with above. Jaxsyn Azam, MD   

## 2020-10-31 ENCOUNTER — Ambulatory Visit (INDEPENDENT_AMBULATORY_CARE_PROVIDER_SITE_OTHER): Payer: Medicare Other | Admitting: General Practice

## 2020-10-31 ENCOUNTER — Other Ambulatory Visit: Payer: Self-pay

## 2020-10-31 DIAGNOSIS — Z8672 Personal history of thrombophlebitis: Secondary | ICD-10-CM | POA: Diagnosis not present

## 2020-10-31 LAB — POCT INR: INR: 2.4 (ref 2.0–3.0)

## 2020-10-31 NOTE — Progress Notes (Signed)
Medical screening examination/treatment/procedure(s) were performed by non-physician practitioner and as supervising physician I was immediately available for consultation/collaboration. I agree with above. Tayjah Lobdell, MD   

## 2020-10-31 NOTE — Patient Instructions (Signed)
Pre visit review using our clinic review tool, if applicable. No additional management support is needed unless otherwise documented below in the visit note.   Continue to take 2 tablets daily except take 1/2 tablet every Tuesday.  Re-check in 6 weeks. 

## 2020-11-02 ENCOUNTER — Encounter: Payer: Self-pay | Admitting: Internal Medicine

## 2020-12-12 ENCOUNTER — Other Ambulatory Visit: Payer: Self-pay

## 2020-12-12 ENCOUNTER — Ambulatory Visit (INDEPENDENT_AMBULATORY_CARE_PROVIDER_SITE_OTHER): Payer: Medicare Other | Admitting: General Practice

## 2020-12-12 DIAGNOSIS — Z8672 Personal history of thrombophlebitis: Secondary | ICD-10-CM | POA: Diagnosis not present

## 2020-12-12 LAB — POCT INR: INR: 2.7 (ref 2.0–3.0)

## 2020-12-12 NOTE — Progress Notes (Signed)
Medical screening examination/treatment/procedure(s) were performed by non-physician practitioner and as supervising physician I was immediately available for consultation/collaboration. I agree with above. Reyana Leisey, MD   

## 2020-12-12 NOTE — Patient Instructions (Addendum)
Pre visit review using our clinic review tool, if applicable. No additional management support is needed unless otherwise documented below in the visit note.   Continue to take 2 tablets daily except take 1/2 tablet every Tuesday.  Re-check in 6 weeks. 

## 2021-01-05 DIAGNOSIS — R0600 Dyspnea, unspecified: Secondary | ICD-10-CM | POA: Diagnosis not present

## 2021-01-05 DIAGNOSIS — J0100 Acute maxillary sinusitis, unspecified: Secondary | ICD-10-CM | POA: Diagnosis not present

## 2021-01-05 DIAGNOSIS — R42 Dizziness and giddiness: Secondary | ICD-10-CM | POA: Diagnosis not present

## 2021-01-05 DIAGNOSIS — J209 Acute bronchitis, unspecified: Secondary | ICD-10-CM | POA: Diagnosis not present

## 2021-01-08 ENCOUNTER — Ambulatory Visit: Payer: Medicare Other | Admitting: Internal Medicine

## 2021-01-23 ENCOUNTER — Ambulatory Visit (INDEPENDENT_AMBULATORY_CARE_PROVIDER_SITE_OTHER): Payer: Medicare Other

## 2021-01-23 ENCOUNTER — Other Ambulatory Visit: Payer: Self-pay

## 2021-01-23 DIAGNOSIS — Z7901 Long term (current) use of anticoagulants: Secondary | ICD-10-CM

## 2021-01-23 LAB — POCT INR: INR: 2.3 (ref 2.0–3.0)

## 2021-01-23 NOTE — Progress Notes (Signed)
Medical screening examination/treatment/procedure(s) were performed by non-physician practitioner and as supervising physician I was immediately available for consultation/collaboration. I agree with above. Jhan Conery, MD   

## 2021-01-23 NOTE — Patient Instructions (Addendum)
Pre visit review using our clinic review tool, if applicable. No additional management support is needed unless otherwise documented below in the visit note.   Continue to take 2 tablets daily except take 1/2 tablet every Tuesday.  Re-check in 6 weeks. 

## 2021-02-02 ENCOUNTER — Other Ambulatory Visit: Payer: Self-pay

## 2021-02-02 ENCOUNTER — Encounter: Payer: Self-pay | Admitting: Internal Medicine

## 2021-02-02 ENCOUNTER — Ambulatory Visit (INDEPENDENT_AMBULATORY_CARE_PROVIDER_SITE_OTHER): Payer: Medicare Other | Admitting: Internal Medicine

## 2021-02-02 VITALS — BP 146/80 | HR 67 | Temp 98.2°F | Ht 75.0 in | Wt 302.0 lb

## 2021-02-02 DIAGNOSIS — E538 Deficiency of other specified B group vitamins: Secondary | ICD-10-CM | POA: Diagnosis not present

## 2021-02-02 DIAGNOSIS — Z23 Encounter for immunization: Secondary | ICD-10-CM

## 2021-02-02 DIAGNOSIS — R739 Hyperglycemia, unspecified: Secondary | ICD-10-CM

## 2021-02-02 DIAGNOSIS — E559 Vitamin D deficiency, unspecified: Secondary | ICD-10-CM | POA: Diagnosis not present

## 2021-02-02 DIAGNOSIS — E7800 Pure hypercholesterolemia, unspecified: Secondary | ICD-10-CM | POA: Diagnosis not present

## 2021-02-02 DIAGNOSIS — I10 Essential (primary) hypertension: Secondary | ICD-10-CM

## 2021-02-02 DIAGNOSIS — Z0001 Encounter for general adult medical examination with abnormal findings: Secondary | ICD-10-CM

## 2021-02-02 MED ORDER — METOPROLOL SUCCINATE ER 50 MG PO TB24: 50.0000 mg | ORAL_TABLET | Freq: Every day | ORAL | 3 refills | Status: DC

## 2021-02-02 NOTE — Progress Notes (Signed)
Patient ID: Justin Woodard, male   DOB: 01-23-1956, 65 y.o.   MRN: [id number]        Chief Complaint: follow up HTN, HLD and hyperglycemia, low vit d       HPI:  Justin Woodard is a 65 y.o. male here overall doing well.  Pt denies chest pain, increased sob or doe, wheezing, orthopnea, PND, increased LE swelling, palpitations, dizziness or syncope.   Pt denies polydipsia, polyuria, or new focal neuro s/s.   Pt denies fever, wt loss, night sweats, loss of appetite, or other constitutional symptoms  No new complaints.         Wt Readings from Last 3 Encounters:  02/02/21 (!) 302 lb (137 kg)  09/07/20 299 lb (135.6 kg)  06/16/20 299 lb (135.6 kg)   BP Readings from Last 3 Encounters:  02/02/21 (!) 146/80  09/07/20 (!) 148/98  06/16/20 140/78         Past Medical History:  Diagnosis Date   A-fib [facility])    post PE only   Anxiety state 04/20/2007   Qualifier: Diagnosis of  By: Jonny Ruiz MD, Len Blalock    DVT (deep venous thrombosis) (HCC)    2000   Dysphagia 09/13/2016   Erectile dysfunction 05/28/2011   Eustachian tube dysfunction, right 06/25/2017   GERD (gastroesophageal reflux disease)    Hypertension    Long term current use of anticoagulant 07/03/2010   Pulmonary embolism (HCC)    Radiculitis of right cervical region 09/13/2016   Right knee pain 06/25/2017   Shingles 02/06/2012   Symptomatic PVCs    Past Surgical History:  Procedure Laterality Date   TONSILLECTOMY     VEIN SURGERY     RLE saphenous vein stripping    reports that he has quit smoking. He has never used smokeless tobacco. He reports that he does not drink alcohol and does not use drugs. family history includes Diabetes in an other family member; Heart attack in his father. Allergies  Allergen Reactions   Oxycodone Other (See Comments)    "Just made me feel bad"   Current Outpatient Medications on File Prior to Visit  Medication Sig Dispense Refill   amLODipine (NORVASC) 5 MG tablet TAKE 1 TABLET(5 MG) BY MOUTH DAILY 90  tablet 2   Cholecalciferol (THERA-D 2000) 50 MCG (2000 UT) TABS 1 tab by mouth once daily 100 tablet 99   fexofenadine (ALLEGRA) 180 MG tablet Take 1 tablet (180 mg total) by mouth daily. 30 tablet 2   guaiFENesin (MUCINEX) 600 MG 12 hr tablet Take 2 tablets (1,200 mg total) by mouth 2 (two) times daily as needed. 60 tablet 1   omeprazole (PRILOSEC) 40 MG capsule Take 1 capsule 40 mg by mouth daily before breakfast. (Patient taking differently: Take 1 capsule 40 mg by mouth daily before breakfast. Mon wed and fri) 90 capsule 1   triamcinolone (NASACORT) 55 MCG/ACT AERO nasal inhaler Place 2 sprays into the nose daily. 1 each 12   No current facility-administered medications on file prior to visit.        ROS:  All others reviewed and negative.  Objective        PE:  BP (!) 146/80 (BP Location: Left Arm, Patient Position: Sitting, Cuff Size: Large)   Pulse 67   Temp 98.2 F (36.8 C) (Oral)   Ht 6\' 3"  (1.905 m)   Wt (!) 302 lb (137 kg)   SpO2 97%   BMI 37.75 kg/m  Constitutional: Pt appears in NAD               HENT: Head: NCAT.                Right Ear: External ear normal.                 Left Ear: External ear normal.                Eyes: . Pupils are equal, round, and reactive to light. Conjunctivae and EOM are normal               Nose: without d/c or deformity               Neck: Neck supple. Gross normal ROM               Cardiovascular: Normal rate and regular rhythm.                 Pulmonary/Chest: Effort normal and breath sounds without rales or wheezing.                Abd:  Soft, NT, ND, + BS, no organomegaly               Neurological: Pt is alert. At baseline orientation, motor grossly intact               Skin: Skin is warm. No rashes, no other new lesions, LE edema - none               Psychiatric: Pt behavior is normal without agitation   Micro: none  Cardiac tracings I have personally interpreted today:  none  Pertinent Radiological findings  (summarize): none   Lab Results  Component Value Date   WBC 4.9 09/07/2020   HGB 14.3 09/07/2020   HCT 41.6 09/07/2020   PLT 220.0 09/07/2020   GLUCOSE 98 09/07/2020   CHOL 179 09/07/2020   TRIG 189.0 (H) 09/07/2020   HDL 39.50 09/07/2020   LDLDIRECT 98.0 03/02/2020   LDLCALC 102 (H) 09/07/2020   ALT 18 09/07/2020   AST 17 09/07/2020   NA 139 09/07/2020   K 4.5 09/07/2020   CL 102 09/07/2020   CREATININE 1.08 09/07/2020   BUN 12 09/07/2020   CO2 29 09/07/2020   TSH 2.90 09/07/2020   PSA 1.94 09/07/2020   INR 2.3 01/23/2021   HGBA1C 5.7 09/07/2020   Assessment/Plan:  Justin Woodard is a 65 y.o. White or Caucasian [1] male with  has a past medical history of A-fib [name]), Anxiety state (04/20/2007), DVT (deep venous thrombosis) (HCC), Dysphagia (09/13/2016), Erectile dysfunction (05/28/2011), Eustachian tube dysfunction, right (06/25/2017), GERD (gastroesophageal reflux disease), Hypertension, Long term current use of anticoagulant (07/03/2010), Pulmonary embolism (HCC), Radiculitis of right cervical region (09/13/2016), Right knee pain (06/25/2017), Shingles (02/06/2012), and Symptomatic PVCs.  Vitamin D deficiency Last vitamin D Lab Results  Component Value Date   VD25OH 27.62 (L) 09/07/2020   Low, to start oral replacement   Hypertension, uncontrolled BP Readings from Last 3 Encounters:  02/02/21 (!) 146/80  09/07/20 (!) 148/98  06/16/20 140/78   Remains uncontrolled, pt to continue medical treatment norvasc but increase toprol xl 50 qd   Hyperglycemia BP Readings from Last 3 Encounters:  02/02/21 (!) 146/80  09/07/20 (!) 148/98  06/16/20 140/78   Stable, pt to continue medical treatment  - diet   HLD (hyperlipidemia) Lab Results  Component Value Date   LDLCALC 102 (H) 09/07/2020  Uncontrolled, goal ldl < 70,  pt declines statin for now, for low chol diet, declines lipid clinic referral  Followup: Return in about 6 months (around 08/02/2021).  Oliver Barre, MD  02/10/2021 1:31 PM [facility]

## 2021-02-02 NOTE — Patient Instructions (Signed)
You had the flu shot today  Ok to increase the toprol XL to 50 mg at bedtime  Please continue all other medications as before, and refills have been done if requested.  Please have the pharmacy call with any other refills you may need.  Please continue your efforts at being more active, low cholesterol diet, and weight control.  Please keep your appointments with your specialists as you may have planned  Please make an Appointment to return in 6 months, or sooner if needed, with labs done at the [facility] 3-5 days before your visit .

## 2021-02-09 ENCOUNTER — Other Ambulatory Visit: Payer: Self-pay | Admitting: Internal Medicine

## 2021-02-09 DIAGNOSIS — Z7901 Long term (current) use of anticoagulants: Secondary | ICD-10-CM

## 2021-02-09 NOTE — Telephone Encounter (Signed)
Pt is compliant with coumadin management. Sent in script 

## 2021-02-09 NOTE — Telephone Encounter (Signed)
Please ask pt to request refill per coumadin clinic 

## 2021-02-10 ENCOUNTER — Encounter: Payer: Self-pay | Admitting: Internal Medicine

## 2021-02-10 NOTE — Assessment & Plan Note (Signed)
Last vitamin D Lab Results  Component Value Date   VD25OH 27.62 (L) 09/07/2020   Low, to start oral replacement

## 2021-02-10 NOTE — Assessment & Plan Note (Signed)
BP Readings from Last 3 Encounters:  02/02/21 (!) 146/80  09/07/20 (!) 148/98  06/16/20 140/78   Remains uncontrolled, pt to continue medical treatment norvasc but increase toprol xl 50 qd

## 2021-02-10 NOTE — Assessment & Plan Note (Signed)
Lab Results  Component Value Date   LDLCALC 102 (H) 09/07/2020   Uncontrolled, goal ldl < 70,  pt declines statin for now, for low chol diet, declines lipid clinic referral

## 2021-02-10 NOTE — Assessment & Plan Note (Signed)
BP Readings from Last 3 Encounters:  02/02/21 (!) 146/80  09/07/20 (!) 148/98  06/16/20 140/78   Stable, pt to continue medical treatment  - diet

## 2021-03-06 ENCOUNTER — Other Ambulatory Visit: Payer: Self-pay

## 2021-03-06 ENCOUNTER — Ambulatory Visit (INDEPENDENT_AMBULATORY_CARE_PROVIDER_SITE_OTHER): Payer: Medicare Other

## 2021-03-06 DIAGNOSIS — Z7901 Long term (current) use of anticoagulants: Secondary | ICD-10-CM | POA: Diagnosis not present

## 2021-03-06 LAB — POCT INR: INR: 2.5 (ref 2.0–3.0)

## 2021-03-06 NOTE — Progress Notes (Signed)
Medical screening examination/treatment/procedure(s) were performed by non-physician practitioner and as supervising physician I was immediately available for consultation/collaboration. I agree with above. Keishaun Hazel, MD   

## 2021-03-06 NOTE — Patient Instructions (Addendum)
Pre visit review using our clinic review tool, if applicable. No additional management support is needed unless otherwise documented below in the visit note.   Continue to take 2 tablets daily except take 1/2 tablet every Tuesday.  Re-check in 6 weeks. 

## 2021-03-12 ENCOUNTER — Emergency Department (HOSPITAL_COMMUNITY)
Admission: EM | Admit: 2021-03-12 | Discharge: 2021-03-13 | Discharge status: Against Medical Advice | Payer: Medicare Other | Attending: Emergency Medicine | Admitting: Emergency Medicine

## 2021-03-12 ENCOUNTER — Emergency Department (HOSPITAL_COMMUNITY): Payer: Medicare Other

## 2021-03-12 ENCOUNTER — Encounter (HOSPITAL_COMMUNITY): Payer: Self-pay | Admitting: Emergency Medicine

## 2021-03-12 DIAGNOSIS — R109 Unspecified abdominal pain: Secondary | ICD-10-CM | POA: Insufficient documentation

## 2021-03-12 DIAGNOSIS — K5730 Diverticulosis of large intestine without perforation or abscess without bleeding: Secondary | ICD-10-CM | POA: Diagnosis not present

## 2021-03-12 DIAGNOSIS — K429 Umbilical hernia without obstruction or gangrene: Secondary | ICD-10-CM | POA: Diagnosis not present

## 2021-03-12 DIAGNOSIS — Z5321 Procedure and treatment not carried out due to patient leaving prior to being seen by health care provider: Secondary | ICD-10-CM | POA: Diagnosis not present

## 2021-03-12 DIAGNOSIS — R1013 Epigastric pain: Secondary | ICD-10-CM | POA: Diagnosis not present

## 2021-03-12 DIAGNOSIS — R0602 Shortness of breath: Secondary | ICD-10-CM | POA: Diagnosis not present

## 2021-03-12 DIAGNOSIS — K219 Gastro-esophageal reflux disease without esophagitis: Secondary | ICD-10-CM | POA: Diagnosis not present

## 2021-03-12 DIAGNOSIS — K56609 Unspecified intestinal obstruction, unspecified as to partial versus complete obstruction: Secondary | ICD-10-CM | POA: Diagnosis not present

## 2021-03-12 LAB — CBC WITH DIFFERENTIAL/PLATELET
Abs Immature Granulocytes: 0.03 10*3/uL (ref 0.00–0.07)
Basophils Absolute: 0 10*3/uL (ref 0.0–0.1)
Basophils Relative: 0 %
Eosinophils Absolute: 0.1 10*3/uL (ref 0.0–0.5)
Eosinophils Relative: 1 %
HCT: 42.7 % (ref 39.0–52.0)
Hemoglobin: 14.2 g/dL (ref 13.0–17.0)
Immature Granulocytes: 0 %
Lymphocytes Relative: 12 %
Lymphs Abs: 1 10*3/uL (ref 0.7–4.0)
MCH: 29.2 pg (ref 26.0–34.0)
MCHC: 33.3 g/dL (ref 30.0–36.0)
MCV: 87.7 fL (ref 80.0–100.0)
Monocytes Absolute: 0.6 10*3/uL (ref 0.1–1.0)
Monocytes Relative: 7 %
Neutro Abs: 6.6 10*3/uL (ref 1.7–7.7)
Neutrophils Relative %: 80 %
Platelets: 249 10*3/uL (ref 150–400)
RBC: 4.87 MIL/uL (ref 4.22–5.81)
RDW: 12.8 % (ref 11.5–15.5)
WBC: 8.3 10*3/uL (ref 4.0–10.5)
nRBC: 0 % (ref 0.0–0.2)

## 2021-03-12 LAB — COMPREHENSIVE METABOLIC PANEL
ALT: 23 U/L (ref 0–44)
AST: 18 U/L (ref 15–41)
Albumin: 3.6 g/dL (ref 3.5–5.0)
Alkaline Phosphatase: 60 U/L (ref 38–126)
Anion gap: 11 (ref 5–15)
BUN: 17 mg/dL (ref 8–23)
CO2: 26 mmol/L (ref 22–32)
Calcium: 9.2 mg/dL (ref 8.9–10.3)
Chloride: 98 mmol/L (ref 98–111)
Creatinine, Ser: 1.08 mg/dL (ref 0.61–1.24)
GFR, Estimated: >60 mL/min (ref >60)
Glucose, Bld: 121 mg/dL — ABNORMAL HIGH (ref 70–99)
Potassium: 3.9 mmol/L (ref 3.5–5.1)
Sodium: 135 mmol/L (ref 135–145)
Total Bilirubin: 2 mg/dL — ABNORMAL HIGH (ref 0.3–1.2)
Total Protein: 7 g/dL (ref 6.5–8.1)

## 2021-03-12 LAB — TROPONIN I (HIGH SENSITIVITY)
Troponin I (High Sensitivity): 4 ng/L (ref <18)
Troponin I (High Sensitivity): 4 ng/L (ref <18)

## 2021-03-12 LAB — LIPASE, BLOOD: Lipase: 25 U/L (ref 11–51)

## 2021-03-12 MED ORDER — IOHEXOL 300 MG/ML  SOLN
100.0000 mL | Freq: Once | INTRAMUSCULAR | Status: AC | PRN
  Administered 2021-03-12: 100 mL via INTRAVENOUS

## 2021-03-12 MED ORDER — ONDANSETRON 4 MG PO TBDP
4.0000 mg | ORAL_TABLET | Freq: Once | ORAL | Status: AC
  Administered 2021-03-12: 4 mg via ORAL
  Filled 2021-03-12: qty 1

## 2021-03-12 NOTE — ED Provider Notes (Signed)
Emergency Medicine Provider Triage Evaluation Note  Justin Woodard , a 65 y.o. male  was evaluated in triage.  Pt complains of epigastric abdominal pain and sensation of reflux for the last 24 hours that prevented him from sleeping.  Additionally Dors is generalized abdominal distention and gurgling only in the left lower abdomen.  Unable to have a normal bowel movement since this started though he feels the need to.  On omeprazole for his GERD.  Review of Systems  Positive: Abdominal pain, nausea, NBNB emesis, abdominal distention, shortness of breath Negative: Fevers, chills, chest pain, palpitation  Physical Exam  BP (!) 143/98 (BP Location: Right Arm)   Pulse 95   Temp 98.3 F (36.8 C) (Oral)   Resp 18   Ht 6\' 3"  (1.905 m)   Wt 136.1 kg   SpO2 97%   BMI 37.50 kg/m  Gen:   Awake, no distress   Resp:  Normal effort  MSK:   Moves extremities without difficulty  Other:  Abdomen protuberant without fluid wave.  Decreased bowel sounds.  Epigastric tenderness palpation.  Lungs CTA B.  Medical Decision Making  Medically screening exam initiated at 3:35 PM.  Appropriate orders placed.  was informed that the remainder of the evaluation will be completed by another provider, this initial triage assessment does not replace that evaluation, and the importance of remaining in the ED until their evaluation is complete.  This chart was dictated using voice recognition software, Dragon. Despite the best efforts of this provider to proofread and correct errors, errors may still occur which can change documentation meaning.    [name], PA-C 03/12/21 1600    03/14/21, MD 03/12/21 1806

## 2021-03-12 NOTE — ED Triage Notes (Signed)
Pt reports abd pain/ reflux where he has not been able to sleep or lay down. Endorses n/v. Denies CP.

## 2021-03-13 NOTE — ED Notes (Addendum)
Pt called multiples times

## 2021-03-13 NOTE — ED Provider Notes (Signed)
1:19 AM  Patient appears to have left the waiting room prior to full evaluation.  CT scan concerning for SBO.  Left a voicemail on the patient's personal cell phone number to call back regarding his test results and to return to the emergency room.   Shon Baton, MD 03/13/21 0120

## 2021-03-14 ENCOUNTER — Telehealth (HOSPITAL_COMMUNITY): Payer: Self-pay | Admitting: Emergency Medicine

## 2021-03-14 NOTE — Telephone Encounter (Signed)
Patient reports much better.  Advised to see his PCP ASAP calling tomorrow morning.  He is to return to the emergency department if pain recurs or symptoms return.

## 2021-03-15 ENCOUNTER — Encounter: Payer: Self-pay | Admitting: Internal Medicine

## 2021-04-17 ENCOUNTER — Ambulatory Visit (INDEPENDENT_AMBULATORY_CARE_PROVIDER_SITE_OTHER): Payer: Medicare Other

## 2021-04-17 ENCOUNTER — Other Ambulatory Visit: Payer: Self-pay

## 2021-04-17 DIAGNOSIS — Z7901 Long term (current) use of anticoagulants: Secondary | ICD-10-CM

## 2021-04-17 LAB — POCT INR: INR: 2.7 (ref 2.0–3.0)

## 2021-04-17 NOTE — Progress Notes (Signed)
Patient ID: Justin Woodard, male   DOB: 11/15/1955, 65 y.o.   MRN: [id number] Medical screening examination/treatment/procedure(s) were performed by non-physician practitioner and as supervising physician I was immediately available for consultation/collaboration.  I agree with above. Oliver Barre, MD

## 2021-04-17 NOTE — Progress Notes (Signed)
Continue to take 2 tablets daily except take 1/2 tablet every Tuesday.  Recheck in 6 weeks. 

## 2021-04-17 NOTE — Patient Instructions (Addendum)
Pre visit review using our clinic review tool, if applicable. No additional management support is needed unless otherwise documented below in the visit note.   Continue to take 2 tablets daily except take 1/2 tablet every Tuesday.  Re-check in 6 weeks. 

## 2021-05-09 ENCOUNTER — Encounter: Payer: Self-pay | Admitting: Internal Medicine

## 2021-05-09 DIAGNOSIS — Z7901 Long term (current) use of anticoagulants: Secondary | ICD-10-CM

## 2021-05-10 MED ORDER — AMLODIPINE BESYLATE 5 MG PO TABS: ORAL_TABLET | ORAL | 2 refills | Status: DC

## 2021-05-10 MED ORDER — WARFARIN SODIUM 4 MG PO TABS
ORAL_TABLET | ORAL | 1 refills | Status: DC
Start: 2021-05-10 — End: 2021-11-12

## 2021-05-10 NOTE — Telephone Encounter (Signed)
Medication refilled

## 2021-05-25 ENCOUNTER — Encounter: Payer: Medicare Other | Admitting: Physician Assistant

## 2021-05-25 ENCOUNTER — Telehealth: Payer: Medicare Other | Admitting: Physician Assistant

## 2021-05-25 DIAGNOSIS — J019 Acute sinusitis, unspecified: Secondary | ICD-10-CM

## 2021-05-25 DIAGNOSIS — B9689 Other specified bacterial agents as the cause of diseases classified elsewhere: Secondary | ICD-10-CM | POA: Diagnosis not present

## 2021-05-25 DIAGNOSIS — H66002 Acute suppurative otitis media without spontaneous rupture of ear drum, left ear: Secondary | ICD-10-CM

## 2021-05-25 MED ORDER — CLINDAMYCIN HCL 300 MG PO CAPS
300.0000 mg | ORAL_CAPSULE | Freq: Three times a day (TID) | ORAL | 0 refills | Status: DC
Start: 2021-05-25 — End: 2021-08-13

## 2021-05-25 NOTE — Progress Notes (Signed)
Duplicate

## 2021-05-25 NOTE — Progress Notes (Signed)

## 2021-05-29 ENCOUNTER — Ambulatory Visit (INDEPENDENT_AMBULATORY_CARE_PROVIDER_SITE_OTHER): Payer: Medicare Other

## 2021-05-29 ENCOUNTER — Other Ambulatory Visit: Payer: Self-pay

## 2021-05-29 DIAGNOSIS — Z7901 Long term (current) use of anticoagulants: Secondary | ICD-10-CM

## 2021-05-29 LAB — POCT INR: INR: 2.6 (ref 2.0–3.0)

## 2021-05-29 NOTE — Progress Notes (Signed)
Continue to take 2 tablets daily except take 1/2 tablet every Tuesday.  Recheck in 6 weeks. 

## 2021-05-29 NOTE — Patient Instructions (Addendum)
Pre visit review using our clinic review tool, if applicable. No additional management support is needed unless otherwise documented below in the visit note.   Continue to take 2 tablets daily except take 1/2 tablet every Tuesday.  Re-check in 6 weeks. 

## 2021-05-29 NOTE — Progress Notes (Signed)
Patient ID: Justin Woodard, male   DOB: 03/04/1956, 65 y.o.   MRN: [id number] Medical screening examination/treatment/procedure(s) were performed by non-physician practitioner and as supervising physician I was immediately available for consultation/collaboration.  I agree with above. Mertis Mosher, MD  

## 2021-07-10 ENCOUNTER — Other Ambulatory Visit: Payer: Self-pay

## 2021-07-10 ENCOUNTER — Ambulatory Visit (INDEPENDENT_AMBULATORY_CARE_PROVIDER_SITE_OTHER): Payer: Medicare Other

## 2021-07-10 DIAGNOSIS — Z7901 Long term (current) use of anticoagulants: Secondary | ICD-10-CM

## 2021-07-10 LAB — POCT INR: INR: 2.7 (ref 2.0–3.0)

## 2021-07-10 NOTE — Progress Notes (Signed)
Patient ID: Justin Woodard, male   DOB: 01/22/1956, 65 y.o.   MRN: [id number] Medical screening examination/treatment/procedure(s) were performed by non-physician practitioner and as supervising physician I was immediately available for consultation/collaboration.  I agree with above. Shigeru Lampert, MD  

## 2021-07-10 NOTE — Patient Instructions (Addendum)
Pre visit review using our clinic review tool, if applicable. No additional management support is needed unless otherwise documented below in the visit note.   Continue to take 2 tablets daily except take 1/2 tablet every Tuesday.  Re-check in 6 weeks. 

## 2021-07-10 NOTE — Progress Notes (Signed)
Continue to take 2 tablets daily except take 1/2 tablet every Tuesday.  Recheck in 6 weeks. 

## 2021-07-24 NOTE — Progress Notes (Signed)
Cardiology Office Note Date:  07/24/2021  Patient ID:  Justin Woodard, Justin Woodard 25-Jul-1955, MRN [id number] PCP:  Corwin Levins, MD  Electrophysiologist:  Dr. Ladona Ridgel   Chief Complaint:  1 year annual visit  History of Present Illness: Justin Woodard is a 66 y.o. male with history of AF associated with DVT/PE (2000), HTN, GERD, PVCs.  He comes today to be seen for Dr. Ladona Ridgel, last seen by him July 2020.  At that visit had an event of some altered mentation a few weeks prior, associated with high BP, no syncope, evaluated at the ER and discharged.  No symptoms of AFib.  No changes were made.   Seen by Luster Landsberg Jan 2021 Recently had some flu like symptoms, negative for COVID in Dec, more recently seeing GI for some dysphagia, did not want to pursue EGD not wanting to interrupt his a/c, so planned for barium swallow. He is doing well.  Works at [employer], mostly with Animator. On his feet part of his day, more seated these days.  He does not exercise.  He denies any kind of cardiac awareness, no CP or palpitations.  He denies any SOB or DOE with his ADLs, no difficulties.  No dizzy spells near syncope or syncope. He mentions he has had bad veins in his legs since his youth and c/o a constant aching/heaviness in his legs, both at rest and with ambulation.  Ambulation does not necessarily cause worsening and does not need to stop walking.  Asks who he should see for this. Says years ago he spent most of his day at work on his feet, though has had "bad veins" even when he was much younger. He denies any bleeding or signs of bleeding, warfarin is monitored and managed by his PMD/coumadin clinic He has a annual visit with his PMD in march, will be getting his lipids done He was given information for [facility] vasc group, planned for annual visit  03/28/20 had ER visit with L belly pain, suspect to be gastritis and instructed to resume his ompeprazole   Seen by Luster Landsberg Jan 2022 He is  doing OK Retired this month, both he and his wife. He reports slowing over the years, this a slow steady process. He has epigastric pain that the omeprazole definitely helps though makes him feel poorly in general, has a hard time describing the side effect. No CP or palpitations.  Sometimes he has an awareness of his heart beat, but not fast, not irregulat. No near syncope or syncope. No bleeding or signs of bleeding. Ever since his PE years ago he has never felt like his breathing was the same. Randomly will feel like he is a bit breathless, lasts only a few seconds.  Not changes over the years PMD manages his warfarin Very sedentary He says his varicose veins limit how much he can do in the way of exercise.  Did not want to pursue vascular evaluation, his PE occurred after SVG intervention, and is weary to consider again. 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  He presents today for his 1 year annual checkup.  He states that he has been doing well and is enjoying his retirement. Today he denies,palpitations, dyspnea, PND, orthopnea, nausea, vomiting, dizziness, syncope, edema, weight gain, or early satiety. He does report one incident in October of last year when he went  to the emergency room with a small bowel obstruction. Unfortunately he left AMA before treatment and states that it resolved on its own.  He does endorse occasional chest pain that is without exertion that resolves spontaneously. He he plans to discuss this further with his PCP next month.   Past Medical History:  Diagnosis Date   A-fib [facility])    post PE only   Anxiety state 04/20/2007   Qualifier: Diagnosis of  By: Jonny Ruiz MD, Len Blalock    DVT (deep venous thrombosis) (HCC)    2000   Dysphagia 09/13/2016   Erectile dysfunction 05/28/2011   Eustachian tube dysfunction, right 06/25/2017   GERD (gastroesophageal reflux disease)     Hypertension    Long term current use of anticoagulant 07/03/2010   Pulmonary embolism (HCC)    Radiculitis of right cervical region 09/13/2016   Right knee pain 06/25/2017   Shingles 02/06/2012   Symptomatic PVCs     Past Surgical History:  Procedure Laterality Date   TONSILLECTOMY     VEIN SURGERY     RLE saphenous vein stripping    Current Outpatient Medications  Medication Sig Dispense Refill   amLODipine (NORVASC) 5 MG tablet TAKE 1 TABLET(5 MG) BY MOUTH DAILY 90 tablet 2   Cholecalciferol (THERA-D 2000) 50 MCG (2000 UT) TABS 1 tab by mouth once daily 100 tablet 99   clindamycin (CLEOCIN) 300 MG capsule Take 1 capsule (300 mg total) by mouth 3 (three) times daily. 21 capsule 0   fexofenadine (ALLEGRA) 180 MG tablet Take 1 tablet (180 mg total) by mouth daily. 30 tablet 2   guaiFENesin (MUCINEX) 600 MG 12 hr tablet Take 2 tablets (1,200 mg total) by mouth 2 (two) times daily as needed. 60 tablet 1   metoprolol succinate (TOPROL-XL) 50 MG 24 hr tablet Take 1 tablet (50 mg total) by mouth at bedtime. 90 tablet 3   omeprazole (PRILOSEC) 40 MG capsule Take 1 capsule 40 mg by mouth daily before breakfast. (Patient taking differently: Take 1 capsule 40 mg by mouth daily before breakfast. Mon wed and fri) 90 capsule 1   triamcinolone (NASACORT) 55 MCG/ACT AERO nasal inhaler Place 2 sprays into the nose daily. 1 each 12   warfarin (COUMADIN) 4 MG tablet TAKE 2 TABLETS BY MOUTH DAILY EXCEPT TAKE 1/2 TABLET ON TUESDAYS OR AS DIRECTED BY COAGULATION CLINIC 180 tablet 1   No current facility-administered medications for this visit.    Allergies:   Oxycodone   Social History:  The patient  reports that he has quit smoking. He has never used smokeless tobacco. He reports that he does not drink alcohol and does not use drugs.   Family History:  The patient's family history includes Diabetes in an other family member; Heart attack in his father.  ROS:  Please see the history of present  illness.All other systems are reviewed and otherwise negative.   PHYSICAL EXAM:  VS:  There were no vitals taken for this visit. BMI: There is no height or weight on file to calculate BMI. Well nourished, well developed, in no acute distress  HEENT: normocephalic, atraumatic  Neck: no JVD, carotid bruits or masses Cardiac:   RRR; no significant murmurs, no rubs, or gallops Lungs:   Clear to auscultation with no wheezing, rhonchi or rales  Abd: soft, non tender, obese MS: no deformity atrophy Skin: warm and dry, no rash Neuro:  No gross deficits appreciated Psych: euthymic mood, full affect   EKG: not done today  08/02/16 TTE Study Conclusions - Left ventricle: The cavity size was normal. Wall thickness   was increased in a pattern of mild LVH. Systolic function   was normal. The estimated ejection fraction was in the   range of 60% to 65%. Wall motion was normal; there were no   regional wall motion abnormalities. - Left atrium: The atrium was mildly dilated. - Right atrium: The atrium was mildly dilated.   Recent Labs: 09/07/2020: TSH 2.90 03/12/2021: ALT 23; BUN 17; Creatinine, Ser 1.08; Hemoglobin 14.2; Platelets 249; Potassium 3.9; Sodium 135  09/07/2020: Cholesterol 179; HDL 39.50; LDL Cholesterol 102; Total CHOL/HDL Ratio 5; Triglycerides 189.0; VLDL 37.8   CrCl cannot be calculated (Patient's most recent lab result is older than the maximum 21 days allowed.).   Wt Readings from Last 3 Encounters:  03/12/21 300 lb (136.1 kg)  02/02/21 (!) 302 lb (137 kg)  09/07/20 299 lb (135.6 kg)     Other studies reviewed: Additional studies/records reviewed today include: summarized above  ASSESSMENT AND PLAN:  1. Paroxysmal AFib     CHA2DS2Vasc is 4,  on warfarin also w/hx of 2/2 DVT/PE hx -Continue Toprol XL 25 mg -Warfarin managed by PCP no S/S of bleeding noted today - No symptoms to suggest recurrent AF and patient denies any palpitations with or without activity  2.  HTN -Blood pressure elevated today was 142/78 -We will increase his Norvasc to 10 mg daily and he will return for follow-up in 4 weeks.  He will write down his blood pressures and heart rate and report those to [place] in 1 week -Continue Toprol XL 25 mg  3. HLD:  Managed by PCP continue lifestyle modifications -Last LDL: 102   Disposition: we will continue to see him annually, sooner if needed.  Current medicines are reviewed at length with the patient today.  The patient did not have any concerns regarding medicines.  Norma Fredrickson, PA-C 07/24/2021 12:33 PM     [facility] [address] [place] [address] [phone] (office)  [phone] (fax)

## 2021-07-26 ENCOUNTER — Ambulatory Visit: Payer: Medicare Other | Admitting: Nurse Practitioner

## 2021-07-26 ENCOUNTER — Other Ambulatory Visit: Payer: Self-pay

## 2021-07-26 ENCOUNTER — Encounter: Payer: Self-pay | Admitting: Nurse Practitioner

## 2021-07-26 VITALS — BP 142/78 | HR 67 | Ht 75.0 in | Wt 296.2 lb

## 2021-07-26 DIAGNOSIS — E7800 Pure hypercholesterolemia, unspecified: Secondary | ICD-10-CM

## 2021-07-26 DIAGNOSIS — I480 Paroxysmal atrial fibrillation: Secondary | ICD-10-CM | POA: Diagnosis not present

## 2021-07-26 DIAGNOSIS — I10 Essential (primary) hypertension: Secondary | ICD-10-CM | POA: Diagnosis not present

## 2021-07-26 MED ORDER — AMLODIPINE BESYLATE 10 MG PO TABS: ORAL_TABLET | ORAL | 1 refills | Status: DC

## 2021-07-26 MED ORDER — AMLODIPINE BESYLATE 10 MG PO TABS: 10.0000 mg | ORAL_TABLET | Freq: Every day | ORAL | 1 refills | Status: DC

## 2021-07-26 NOTE — Patient Instructions (Addendum)
Medication Instructions:  ? ?START TAKING NORVASC 10 MG ONCE A DAY  ? ?*If you need a refill on your cardiac medications before your next appointment, please call your pharmacy* ? ? ?Lab Work: NONE ORDERED  TODAY ? ? ?If you have labs (blood work) drawn today and your tests are completely normal, you will receive your results only by: ?MyChart Message (if you have MyChart) OR ?A paper copy in the mail ?If you have any lab test that is abnormal or we need to change your treatment, we will call you to review the results. ? ? ?Testing/Procedures: NONE ORDERED  TODAY ? ? ? ? ?Follow-Up: ?At [facility], you and your health needs are our priority.  As part of our continuing mission to provide you with exceptional heart care, we have created designated Provider Care Teams.  These Care Teams include your primary Cardiologist (physician) and Advanced Practice Providers (APPs -  Physician Assistants and Nurse Practitioners) who all work together to provide you with the care you need, when you need it. ? ?We recommend signing up for the patient portal called "MyChart".  Sign up information is provided on this After Visit Summary.  MyChart is used to connect with patients for Virtual Visits (Telemedicine).  Patients are able to view lab/test results, encounter notes, upcoming appointments, etc.  Non-urgent messages can be sent to your provider as well.   ?To learn more about what you can do with MyChart, go to [email or web address].   ? ?Your next appointment:   ?4 week(s) ? ?The format for your next appointment:   ?In Person ? ?Provider:   ?You may see one of the following Advanced Practice Providers on your designated Care Team: Francis Dowse, [place] ?Elizabeth Palau NP ? ? ? ?Other Instructions ? ?

## 2021-08-12 ENCOUNTER — Encounter: Payer: Self-pay | Admitting: Internal Medicine

## 2021-08-13 MED ORDER — METOPROLOL SUCCINATE ER 50 MG PO TB24: 50.0000 mg | ORAL_TABLET | Freq: Every day | ORAL | 3 refills | Status: DC

## 2021-08-13 NOTE — Telephone Encounter (Signed)
Ok to contact pt - ? ?Ok for increased toprol xl 50 qd then ROV in 1 wk ? ?Continue all other medications ?

## 2021-08-24 ENCOUNTER — Ambulatory Visit: Payer: Medicare Other

## 2021-08-31 ENCOUNTER — Ambulatory Visit (INDEPENDENT_AMBULATORY_CARE_PROVIDER_SITE_OTHER): Payer: Medicare Other

## 2021-08-31 DIAGNOSIS — Z7901 Long term (current) use of anticoagulants: Secondary | ICD-10-CM | POA: Diagnosis not present

## 2021-08-31 LAB — POCT INR: INR: 2.4 (ref 2.0–3.0)

## 2021-08-31 NOTE — Patient Instructions (Addendum)
Pre visit review using our clinic review tool, if applicable. No additional management support is needed unless otherwise documented below in the visit note. ? ?Continue to take 2 tablets daily except take 1/2 tablet every Tuesday.  Re-check in 7 weeks. ?

## 2021-08-31 NOTE — Progress Notes (Signed)
Patient ID: Justin Woodard, male   DOB: 11/14/1955, 65 y.o.   MRN: [id number] Medical screening examination/treatment/procedure(s) were performed by non-physician practitioner and as supervising physician I was immediately available for consultation/collaboration.  I agree with above. Kalik Hoare, MD  

## 2021-08-31 NOTE — Progress Notes (Addendum)
Continue to take 2 tablets daily except take 1/2 tablet every Tuesday.  Re-check in 7 weeks. ?

## 2021-09-02 NOTE — Progress Notes (Signed)
? ?Cardiology Office Note ?Date:  09/02/2021  ?Patient ID:  Justin Woodard, DOB 09/02/1955, MRN [id number] ?PCP:  Corwin LevinsJohn, James W, MD  ?Electrophysiologist:  Dr. Ladona Ridgelaylor ?  ?Chief Complaint:   4 week ? ?History of Present Illness: ?Justin Woodard is a 66 y.o. male with history of AF associated with DVT/PE (2000), HTN, GERD, PVCs. ? ?He comes today to be seen for Dr. Ladona Ridgelaylor, last seen by him July 2020.  At that visit had an event of some altered mentation a few weeks prior, associated with high BP, no syncope, evaluated at the ER and discharged.  No symptoms of AFib.  No changes were made. ? ? ?I saw him Jan 2021 ?Recently had some flu like symptoms, negative for COVID in Dec, more recently seeing GI for some dysphagia, did not want to pursue EGD not wanting to interrupt his a/c, so planned for barium swallow. ?He is doing well.  Works at [employer], mostly with [employer]. On his feet part of his day, more seated these days.  He does not exercise.  He denies any kind of cardiac awareness, no CP or palpitations.  He denies any SOB or DOE with his ADLs, no difficulties.  No dizzy spells near syncope or syncope. ?He mentions he has had bad veins in his legs since his youth and c/o a constant aching/heaviness in his legs, both at rest and with ambulation.  Ambulation does not necessarily cause worsening and does not need to stop walking.  Asks who he should see for this. ?Says years ago he spent most of his day at work on his feet, though has had "bad veins" even when he was much younger. ?He denies any bleeding or signs of bleeding, warfarin is monitored and managed by his PMD/coumadin clinic ?He has a annual visit with his PMD in march, will be getting his lipids done ?He was given information for [facility], planned for annual visit ? ?03/28/20 had ER visit with L belly pain, suspect to be gastritis and instructed to resume his ompeprazole ? ? ?I saw him Jan 2022 ?He is doing OK ?Retired this  month, both he and his wife. ?He reports slowing over the years, this a slow steady process. ?He has epigastric pain that the omeprazole definitely helps though makes him feel poorly in general, has a hard time describing the side effect. ?No CP or palpitations.  Sometimes he has an awareness of his heart beat, but not fast, not irregulat. ?No near syncope or syncope. ?No bleeding or signs of bleeding. ?Ever since his PE years ago he has never felt like his breathing was the same. Randomly will feel like he is a bit breathless, lasts only a few seconds.  Not changes over the years. ?PMD manages his warfarin ?Very sedentary ?He says his varicose veins limit how much he can do in the way of exercise.  Did not want to pursue vascular evaluation, his PE occurred after SVG intervention, and is weary to consider again. ?No changes were made, asked to monitor his BP and let us know if elevated routinely ? ?He saw Inda CokeE Dick, NP 07/26/21 for an annual visit.  Had an ER visit with belly pain, SBO, left AMA with plans to follow up with his PMD, thankfully symptoms resolved, had some CP, thought perhaps atypical.  BP was elevated and his amlodipine increased with plans to be see in a month again. ? ?TODAY ?He had been well, though in  the last 2 AMs has had a sense of feeling unwell. ?Yesterday and this AM had an awareness of his heart beat, could hear it in his ears and a sense that it was a bit fast, about 100bpm, once up and moving a nervous sensation in his epigastrium.  A general sense of feeling anxious.  ?BP 92/76 and a couple minutes later checked again 161/83 ?Otherwise his BPs had been about 120's-140's/60'-80's since the med change ?No pain, maybe felt a little breathless.  ?This lasted about 3 hours, made no difference if sitting, laying or ambulating, then was gone and felt well. ?He washed his truck yesterday afternoon without symptoms or SOB, felt fine the rest of the day. ? ?No bleeding or signs of bleeding, his PMD  manages his warfarin, has been consistently therapeutic ?Sees PMD next week ?He wonders if he is having anxiety or panic attacks ? ? ?Past Medical History:  ?Diagnosis Date  ? A-fib (HCC)   ? post PE only  ? Anxiety state 04/20/2007  ? Qualifier: Diagnosis of  By: Jonny Ruiz MD, Len Blalock   ? DVT (deep venous thrombosis) (HCC)   ? 2000  ? Dysphagia 09/13/2016  ? Erectile dysfunction 05/28/2011  ? Eustachian tube dysfunction, right 06/25/2017  ? GERD (gastroesophageal reflux disease)   ? Hypertension   ? Long term current use of anticoagulant 07/03/2010  ? Pulmonary embolism (HCC)   ? Radiculitis of right cervical region 09/13/2016  ? Right knee pain 06/25/2017  ? Shingles 02/06/2012  ? Symptomatic PVCs   ? ? ?Past Surgical History:  ?Procedure Laterality Date  ? TONSILLECTOMY    ? VEIN SURGERY    ? RLE saphenous vein stripping  ? ? ?Current Outpatient Medications  ?Medication Sig Dispense Refill  ? amLODipine (NORVASC) 10 MG tablet Take 1 tablet (10 mg total) by mouth daily. 90 tablet 1  ? Cholecalciferol (THERA-D 2000) 50 MCG (2000 UT) TABS 1 tab by mouth once daily 100 tablet 99  ? fexofenadine (ALLEGRA) 180 MG tablet Take 1 tablet (180 mg total) by mouth daily. (Patient not taking: Reported on 07/26/2021) 30 tablet 2  ? guaiFENesin (MUCINEX) 600 MG 12 hr tablet Take 2 tablets (1,200 mg total) by mouth 2 (two) times daily as needed. (Patient not taking: Reported on 07/26/2021) 60 tablet 1  ? metoprolol succinate (TOPROL-XL) 50 MG 24 hr tablet Take 1 tablet (50 mg total) by mouth daily. Take with or immediately following a meal. 90 tablet 3  ? omeprazole (PRILOSEC) 40 MG capsule Take 1 capsule 40 mg by mouth daily before breakfast. (Patient taking differently: Take 1 capsule 40 mg by mouth daily before breakfast. Mon wed and fri) 90 capsule 1  ? triamcinolone (NASACORT) 55 MCG/ACT AERO nasal inhaler Place 2 sprays into the nose daily. (Patient not taking: Reported on 07/26/2021) 1 each 12  ? warfarin (COUMADIN) 4 MG tablet TAKE 2 TABLETS  BY MOUTH DAILY EXCEPT TAKE 1/2 TABLET ON TUESDAYS OR AS DIRECTED BY COAGULATION CLINIC 180 tablet 1  ? ?No current facility-administered medications for this visit.  ? ? ?Allergies:   Oxycodone  ? ?Social History:  The patient  reports that he has quit smoking. He has never used smokeless tobacco. He reports that he does not drink alcohol and does not use drugs.  ? ?Family History:  The patient's family history includes Diabetes in an other family member; Heart attack in his father. ? ?ROS:  Please see the history of present illness.All other systems are  reviewed and otherwise negative.  ? ?PHYSICAL EXAM:  ?VS:  There were no vitals taken for this visit. BMI: There is no height or weight on file to calculate BMI. ?Well nourished, well developed, in no acute distress  ?HEENT: normocephalic, atraumatic  ?Neck: no JVD, carotid bruits or masses ?Cardiac:  RRR; no significant murmurs, no rubs, or gallops ?Lungs:  CTA b/l, no wheezing, rhonchi or rales  ?Abd: soft, non tender, obese ?MS: no deformity atrophy ?Ext:  trace edema b/l (reported as chronicR>L<  and at baseline with known severe varicose veins), significant spider veins in his feet. ?Skin: warm and dry, no rash ?Neuro:  No gross deficits appreciated ?Psych: euthymic mood, full affect ? ? ?EKG: done today and reviewed by myself ?SR 80bpm, no changes ? ? ?08/02/16 TTE ?Study Conclusions ?- Left ventricle: The cavity size was normal. Wall thickness ?  was increased in a pattern of mild LVH. Systolic function ?  was normal. The estimated ejection fraction was in the ?  range of 60% to 65%. Wall motion was normal; there were no ?  regional wall motion abnormalities. ?- Left atrium: The atrium was mildly dilated. ?- Right atrium: The atrium was mildly dilated. ? ? ?Recent Labs: ?09/07/2020: TSH 2.90 ?03/12/2021: ALT 23; BUN 17; Creatinine, Ser 1.08; Hemoglobin 14.2; Platelets 249; Potassium 3.9; Sodium 135  ?09/07/2020: Cholesterol 179; HDL 39.50; LDL Cholesterol 102;  Total CHOL/HDL Ratio 5; Triglycerides 189.0; VLDL 37.8  ? ?CrCl cannot be calculated (Patient's most recent lab result is older than the maximum 21 days allowed.).  ? ?Wt Readings from Last 3 Encounters

## 2021-09-04 ENCOUNTER — Ambulatory Visit: Payer: Medicare Other | Admitting: Physician Assistant

## 2021-09-04 ENCOUNTER — Encounter: Payer: Self-pay | Admitting: Physician Assistant

## 2021-09-04 ENCOUNTER — Ambulatory Visit (INDEPENDENT_AMBULATORY_CARE_PROVIDER_SITE_OTHER): Payer: Medicare Other

## 2021-09-04 VITALS — BP 146/84 | HR 85 | Ht 75.0 in | Wt 297.0 lb

## 2021-09-04 DIAGNOSIS — I10 Essential (primary) hypertension: Secondary | ICD-10-CM | POA: Diagnosis not present

## 2021-09-04 DIAGNOSIS — I480 Paroxysmal atrial fibrillation: Secondary | ICD-10-CM | POA: Diagnosis not present

## 2021-09-04 DIAGNOSIS — R002 Palpitations: Secondary | ICD-10-CM | POA: Diagnosis not present

## 2021-09-04 NOTE — Patient Instructions (Addendum)
Medication Instructions:  ? ?Your physician recommends that you continue on your current medications as directed. Please refer to the Current Medication list given to you today. ? ?*If you need a refill on your cardiac medications before your next appointment, please call your pharmacy* ? ? ?Lab Work: [facility] ? ? ?If you have labs (blood work) drawn today and your tests are completely normal, you will receive your results only by: ?MyChart Message (if you have MyChart) OR ?A paper copy in the mail ?If you have any lab test that is abnormal or we need to change your treatment, we will call you to review the results. ? ? ?Testing/Procedures: Your physician has recommended that you wear an event monitor. Event monitors are medical devices that record the heart?s electrical activity. Doctors most often [place] these monitors to diagnose arrhythmias. Arrhythmias are problems with the speed or rhythm of the heartbeat. The monitor is a small, portable device. You can wear one while you do your normal daily activities. This is usually used to diagnose what is causing palpitations/syncope (passing out). ? ? ? ? ?Follow-Up: ?At [facility], you and your health needs are our priority.  As part of our continuing mission to provide you with exceptional heart care, we have created designated Provider Care Teams.  These Care Teams include your primary Cardiologist (physician) and Advanced Practice Providers (APPs -  Physician Assistants and Nurse Practitioners) who all work together to provide you with the care you need, when you need it. ? ?We recommend signing up for the patient portal called "MyChart".  Sign up information is provided on this After Visit Summary.  MyChart is used to connect with patients for Virtual Visits (Telemedicine).  Patients are able to view lab/test results, encounter notes, upcoming appointments, etc.  Non-urgent messages can be sent to your provider as well.   ?To learn more about what you  can do with MyChart, go to [email or web address].   ? ?Your next appointment:   ?1 month(s)( CONTACT [facility] FOR EP SCHEDULING ISSUES ) ? ? ?The format for your next appointment:   ?In Person ? ?Provider:   ?You may see Dr. Lovena Le  or one of the following Advanced Practice Providers on your designated Care Team:   ?Tommye Standard, PA-C ?Legrand Como "Jonni Sanger" [place], PA-C  ? ? ?Other Instructions ? ? ?Important Information About Sugar ? ? ? ? ?  ?

## 2021-09-04 NOTE — Progress Notes (Unsigned)
[id number] ZIO XT from office inventory applied to patient. ? ?Dr. Ladona Ridgel to read. ?

## 2021-09-10 ENCOUNTER — Ambulatory Visit (INDEPENDENT_AMBULATORY_CARE_PROVIDER_SITE_OTHER): Payer: Medicare Other | Admitting: Internal Medicine

## 2021-09-10 ENCOUNTER — Encounter: Payer: Self-pay | Admitting: Internal Medicine

## 2021-09-10 VITALS — BP 120/70 | HR 65 | Temp 98.4°F | Ht 75.0 in | Wt 294.0 lb

## 2021-09-10 DIAGNOSIS — R739 Hyperglycemia, unspecified: Secondary | ICD-10-CM

## 2021-09-10 DIAGNOSIS — Z1211 Encounter for screening for malignant neoplasm of colon: Secondary | ICD-10-CM

## 2021-09-10 DIAGNOSIS — I10 Essential (primary) hypertension: Secondary | ICD-10-CM | POA: Diagnosis not present

## 2021-09-10 DIAGNOSIS — E559 Vitamin D deficiency, unspecified: Secondary | ICD-10-CM

## 2021-09-10 DIAGNOSIS — Z0001 Encounter for general adult medical examination with abnormal findings: Secondary | ICD-10-CM | POA: Diagnosis not present

## 2021-09-10 DIAGNOSIS — E7800 Pure hypercholesterolemia, unspecified: Secondary | ICD-10-CM | POA: Diagnosis not present

## 2021-09-10 DIAGNOSIS — E538 Deficiency of other specified B group vitamins: Secondary | ICD-10-CM | POA: Diagnosis not present

## 2021-09-10 LAB — CBC WITH DIFFERENTIAL/PLATELET
Basophils Absolute: 0.1 10*3/uL (ref 0.0–0.1)
Basophils Relative: 1.2 % (ref 0.0–3.0)
Eosinophils Absolute: 0.2 10*3/uL (ref 0.0–0.7)
Eosinophils Relative: 3.2 % (ref 0.0–5.0)
HCT: 41.2 % (ref 39.0–52.0)
Hemoglobin: 14.3 g/dL (ref 13.0–17.0)
Lymphocytes Relative: 27.6 % (ref 12.0–46.0)
Lymphs Abs: 1.6 10*3/uL (ref 0.7–4.0)
MCHC: 34.6 g/dL (ref 30.0–36.0)
MCV: 85.4 fl (ref 78.0–100.0)
Monocytes Absolute: 0.4 10*3/uL (ref 0.1–1.0)
Monocytes Relative: 6.5 % (ref 3.0–12.0)
Neutro Abs: 3.5 10*3/uL (ref 1.4–7.7)
Neutrophils Relative %: 61.5 % (ref 43.0–77.0)
Platelets: 230 10*3/uL (ref 150.0–400.0)
RBC: 4.83 Mil/uL (ref 4.22–5.81)
RDW: 13.6 % (ref 11.5–15.5)
WBC: 5.6 10*3/uL (ref 4.0–10.5)

## 2021-09-10 LAB — URINALYSIS, ROUTINE W REFLEX MICROSCOPIC
Bilirubin Urine: NEGATIVE
Hgb urine dipstick: NEGATIVE
Ketones, ur: NEGATIVE
Leukocytes,Ua: NEGATIVE
Nitrite: NEGATIVE
RBC / HPF: NONE SEEN (ref 0–?)
Specific Gravity, Urine: <=1.005 — AB (ref 1.000–1.030)
Total Protein, Urine: NEGATIVE
Urine Glucose: NEGATIVE
Urobilinogen, UA: 0.2 (ref 0.0–1.0)
pH: 6.5 (ref 5.0–8.0)

## 2021-09-10 LAB — PSA: PSA: 2.1 ng/mL (ref 0.10–4.00)

## 2021-09-10 LAB — VITAMIN B12: Vitamin B-12: 289 pg/mL (ref 211–911)

## 2021-09-10 LAB — HEMOGLOBIN A1C: Hgb A1c MFr Bld: 5.8 % (ref 4.6–6.5)

## 2021-09-10 LAB — VITAMIN D 25 HYDROXY (VIT D DEFICIENCY, FRACTURES): VITD: 26.79 ng/mL — ABNORMAL LOW (ref 30.00–100.00)

## 2021-09-10 LAB — TSH: TSH: 4.12 u[IU]/mL (ref 0.35–5.50)

## 2021-09-10 NOTE — Assessment & Plan Note (Signed)
Last vitamin D Lab Results  Component Value Date   VD25OH 27.62 (L) 09/07/2020   Low, to start oral replacement  

## 2021-09-10 NOTE — Progress Notes (Signed)
Patient ID: Justin Woodard, male   DOB: 06/24/1955, 66 y.o.   MRN: [id number] ? ? ? ?     Chief Complaint:: wellness exam and hld, low vit d, htn, b12 deficiency ? ?     HPI:  Justin NeedyRonald T Woodard is a 66 y.o. male here for wellness exam; decliens shingrix, pneumovax, tdap, and colonoscopy, ok for cologuard, o/w up to date ?         ?              Also not taking Vit D or B12.  Declines statin, trying to follow lower chol diet.  Willing for Card Ct score.  .pcj Pt denies new neurological symptoms such as new headache, or facial or extremity weakness or numbness   Pt denies fever, wt loss, night sweats, loss of appetite, or other constitutional symptoms  No other new complaints ?  ?Wt Readings from Last 3 Encounters:  ?09/10/21 294 lb (133.4 kg)  ?09/04/21 297 lb (134.7 kg)  ?07/26/21 296 lb 3.2 oz (134.4 kg)  ? ?BP Readings from Last 3 Encounters:  ?09/10/21 120/70  ?09/04/21 (!) 146/84  ?07/26/21 (!) 142/78  ? ?Immunization History  ?Administered Date(s) Administered  ? Fluad Quad(high Dose 65+) 03/07/2020, 02/02/2021  ? Influenza,inj,Quad PF,6+ Mos 06/25/2017, 06/05/2018  ? PFIZER(Purple Top)SARS-COV-2 Vaccination 07/26/2019, 08/16/2019  ? ?There are no preventive care reminders to display for this patient. ? ?  ? ?Past Medical History:  ?Diagnosis Date  ? A-fib (HCC)   ? post PE only  ? Anxiety state 04/20/2007  ? Qualifier: Diagnosis of  By: Jonny RuizJohn MD, Len BlalockJames W   ? DVT (deep venous thrombosis) (HCC)   ? 2000  ? Dysphagia 09/13/2016  ? Erectile dysfunction 05/28/2011  ? Eustachian tube dysfunction, right 06/25/2017  ? GERD (gastroesophageal reflux disease)   ? Hypertension   ? Long term current use of anticoagulant 07/03/2010  ? Pulmonary embolism (HCC)   ? Radiculitis of right cervical region 09/13/2016  ? Right knee pain 06/25/2017  ? Shingles 02/06/2012  ? Symptomatic PVCs   ? ?Past Surgical History:  ?Procedure Laterality Date  ? TONSILLECTOMY    ? VEIN SURGERY    ? RLE saphenous vein stripping  ? ? reports that he has quit  smoking. He has never used smokeless tobacco. He reports that he does not drink alcohol and does not use drugs. ?family history includes Diabetes in an other family member; Heart attack in his father. ?Allergies  ?Allergen Reactions  ? Oxycodone Other (See Comments)  ?  "Just made me feel bad"  ? ?Current Outpatient Medications on File Prior to Visit  ?Medication Sig Dispense Refill  ? amLODipine (NORVASC) 10 MG tablet Take 1 tablet (10 mg total) by mouth daily. 90 tablet 1  ? Cholecalciferol (THERA-D 2000) 50 MCG (2000 UT) TABS 1 tab by mouth once daily 100 tablet 99  ? metoprolol succinate (TOPROL-XL) 50 MG 24 hr tablet Take 25 mg by mouth daily. Take with or immediately following a meal.    ? omeprazole (PRILOSEC) 40 MG capsule Take 1 capsule 40 mg by mouth daily before breakfast. (Patient taking differently: Take 1 capsule 40 mg by mouth daily before breakfast. Mon wed and fri) 90 capsule 1  ? warfarin (COUMADIN) 4 MG tablet TAKE 2 TABLETS BY MOUTH DAILY EXCEPT TAKE 1/2 TABLET ON TUESDAYS OR AS DIRECTED BY COAGULATION CLINIC 180 tablet 1  ? triamcinolone (NASACORT) 55 MCG/ACT AERO nasal inhaler Place 2 sprays into the  nose daily. 1 each 12  ? ?No current facility-administered medications on file prior to visit.  ? ?     ROS:  All others reviewed and negative. ? ?Objective  ? ?     PE:  BP 120/70 (BP Location: Right Arm, Patient Position: Sitting, Cuff Size: Large)   Pulse 65   Temp 98.4 ?F (36.9 ?C) (Oral)   Ht 6\' 3"  (1.905 m)   Wt 294 lb (133.4 kg)   SpO2 96%   BMI 36.75 kg/m?  ? ?              Constitutional: Pt appears in NAD ?              HENT: Head: NCAT.  ?              Right Ear: External ear normal.   ?              Left Ear: External ear normal.  ?              Eyes: . Pupils are equal, round, and reactive to light. Conjunctivae and EOM are normal ?              Nose: without d/c or deformity ?              Neck: Neck supple. Gross normal ROM ?              Cardiovascular: Normal rate and regular  rhythm.   ?              Pulmonary/Chest: Effort normal and breath sounds without rales or wheezing.  ?              Abd:  Soft, NT, ND, + BS, no organomegaly ?              Neurological: Pt is alert. At baseline orientation, motor grossly intact ?              Skin: Skin is warm. No rashes, no other new lesions, LE edema - none ?              Psychiatric: Pt behavior is normal without agitation  ? ?Micro: none ? ?Cardiac tracings I have personally interpreted today:  none ? ?Pertinent Radiological findings (summarize): none  ? ?Lab Results  ?Component Value Date  ? WBC 5.6 09/10/2021  ? HGB 14.3 09/10/2021  ? HCT 41.2 09/10/2021  ? PLT 230.0 09/10/2021  ? GLUCOSE 103 (H) 09/10/2021  ? CHOL 214 (H) 09/10/2021  ? TRIG 257.0 (H) 09/10/2021  ? HDL 41.30 09/10/2021  ? LDLDIRECT 124.0 09/10/2021  ? LDLCALC 102 (H) 09/07/2020  ? ALT 21 09/10/2021  ? AST 18 09/10/2021  ? NA 141 09/10/2021  ? K 4.2 09/10/2021  ? CL 103 09/10/2021  ? CREATININE 1.03 09/10/2021  ? BUN 11 09/10/2021  ? CO2 25 09/10/2021  ? TSH 4.12 09/10/2021  ? PSA 2.10 09/10/2021  ? INR 2.4 08/31/2021  ? HGBA1C 5.8 09/10/2021  ? ?Assessment/Plan:  ?Justin Woodard is a 66 y.o. White or Caucasian [1] male with  has a past medical history of A-fib [facility]), Anxiety state (04/20/2007), DVT (deep venous thrombosis) (HCC), Dysphagia (09/13/2016), Erectile dysfunction (05/28/2011), Eustachian tube dysfunction, right (06/25/2017), GERD (gastroesophageal reflux disease), Hypertension, Long term current use of anticoagulant (07/03/2010), Pulmonary embolism (HCC), Radiculitis of right cervical region (09/13/2016), Right knee pain (06/25/2017), Shingles (02/06/2012), and Symptomatic PVCs. ? ?Vitamin D deficiency ?  Last vitamin D ?Lab Results  ?Component Value Date  ? VD25OH 27.62 (L) 09/07/2020  ? ?Low, to start oral replacement ? ? ?Encounter for well adult exam with abnormal findings ?Age and sex appropriate education and counseling updated with regular exercise and diet ?Referrals  for preventative services - ok for cologuard, declines  colonoscopy ?Immunizations addressed - declines shingrix, covid booster, tdap ?Smoking counseling  - none needed ?Evidence for depression or other mood disorder - none significant ?Most recent labs reviewed. ?I have personally reviewed and have noted: ?1) the patient's medical and social history ?2) The patient's current medications and supplements ?3) The patient's height, weight, and BMI have been recorded in the chart ? ? ?Hypertension, uncontrolled ?BP Readings from Last 3 Encounters:  ?09/10/21 120/70  ?09/04/21 (!) 146/84  ?07/26/21 (!) 142/78  ? ?Stable, pt to continue medical treatment norvasc, toprol ? ? ?Hyperglycemia ?Lab Results  ?Component Value Date  ? HGBA1C 5.8 09/10/2021  ? ?Stable, pt to continue current medical treatment  - diet ? ? ?HLD (hyperlipidemia) ?Lab Results  ?Component Value Date  ? LDLCALC 102 (H) 09/07/2020  ? ?Uncontrolled, goal ldl < 70, pt to continue current low chol diet, declines statin or zetia or repatha but ok for CT cardiac score testing ? ? ?B12 deficiency ?Lab Results  ?Component Value Date  ? [id number] 09/10/2021  ? ?Low, to start oral replacement - b12 1000 mcg qd ? ?Followup: Return in about 6 months (around 03/12/2022). ? ?Oliver Barre, MD 09/12/2021 8:31 PM ?[facility] ?[facility] ?Internal Medicine ?

## 2021-09-10 NOTE — Patient Instructions (Addendum)
We have discussed the Cardiac CT Score test to measure the calcification level (if any) in your heart arteries.  This test has been ordered in our Computer System, so please call [facility] [place] [facility] directly, as they prefer this, at [phone] to be scheduled. ? ?You will be contacted regarding the referral for: cologuard ? ?Please let us know if you change your mind about the tetanus, pneumonia, and shingles shots ? ?Please continue all other medications as before, and refills have been done if requested. ? ?Please have the pharmacy call with any other refills you may need. ? ?Please continue your efforts at being more active, low cholesterol diet, and weight control. ? ?You are otherwise up to date with prevention measures today. ? ?Please keep your appointments with your specialists as you may have planned ? ?Please go to the LAB at the blood drawing area for the tests to be done ? ?You will be contacted by phone if any changes need to be made immediately.  Otherwise, you will receive a letter about your results with an explanation, but please check with MyChart first. ? ?Please remember to sign up for MyChart if you have not done so, as this will be important to you in the future with finding out test results, communicating by private email, and scheduling acute appointments online when needed. ? ?Please make an Appointment to return in 6 months, or sooner if needed ? ? ? ? ? ?

## 2021-09-11 LAB — LIPID PANEL
Cholesterol: 214 mg/dL — ABNORMAL HIGH (ref 0–200)
HDL: 41.3 mg/dL (ref >39.00)
NonHDL: 172.66
Total CHOL/HDL Ratio: 5
Triglycerides: 257 mg/dL — ABNORMAL HIGH (ref 0.0–149.0)
VLDL: 51.4 mg/dL — ABNORMAL HIGH (ref 0.0–40.0)

## 2021-09-11 LAB — BASIC METABOLIC PANEL
BUN: 11 mg/dL (ref 6–23)
CO2: 25 mEq/L (ref 19–32)
Calcium: 9.1 mg/dL (ref 8.4–10.5)
Chloride: 103 mEq/L (ref 96–112)
Creatinine, Ser: 1.03 mg/dL (ref 0.40–1.50)
GFR: 74.93 mL/min (ref >60.00)
Glucose, Bld: 103 mg/dL — ABNORMAL HIGH (ref 70–99)
Potassium: 4.2 mEq/L (ref 3.5–5.1)
Sodium: 141 mEq/L (ref 135–145)

## 2021-09-11 LAB — HEPATIC FUNCTION PANEL
ALT: 21 U/L (ref 0–53)
AST: 18 U/L (ref 0–37)
Albumin: 4.1 g/dL (ref 3.5–5.2)
Alkaline Phosphatase: 68 U/L (ref 39–117)
Bilirubin, Direct: 0.1 mg/dL (ref 0.0–0.3)
Total Bilirubin: 1.1 mg/dL (ref 0.2–1.2)
Total Protein: 6.9 g/dL (ref 6.0–8.3)

## 2021-09-11 LAB — LDL CHOLESTEROL, DIRECT: Direct LDL: 124 mg/dL

## 2021-09-12 ENCOUNTER — Encounter: Payer: Self-pay | Admitting: Internal Medicine

## 2021-09-12 NOTE — Assessment & Plan Note (Addendum)
Lab Results  ?Component Value Date  ? LDLCALC 102 (H) 09/07/2020  ? ?Uncontrolled, goal ldl < 70, pt to continue current low chol diet, declines statin or zetia or repatha but ok for CT cardiac score testing ? ?

## 2021-09-12 NOTE — Assessment & Plan Note (Signed)
Lab Results  ?Component Value Date  ? HGBA1C 5.8 09/10/2021  ? ?Stable, pt to continue current medical treatment  - diet ? ?

## 2021-09-12 NOTE — Assessment & Plan Note (Signed)
Lab Results  ?Component Value Date  ? [id number] 09/10/2021  ? ?Low, to start oral replacement - b12 1000 mcg qd ? ?

## 2021-09-12 NOTE — Assessment & Plan Note (Signed)
Age and sex appropriate education and counseling updated with regular exercise and diet ?Referrals for preventative services - ok for cologuard, declines  colonoscopy ?Immunizations addressed - declines shingrix, covid booster, tdap ?Smoking counseling  - none needed ?Evidence for depression or other mood disorder - none significant ?Most recent labs reviewed. ?I have personally reviewed and have noted: ?1) the patient's medical and social history ?2) The patient's current medications and supplements ?3) The patient's height, weight, and BMI have been recorded in the chart ? ?

## 2021-09-12 NOTE — Assessment & Plan Note (Signed)
BP Readings from Last 3 Encounters:  ?09/10/21 120/70  ?09/04/21 (!) 146/84  ?07/26/21 (!) 142/78  ? ?Stable, pt to continue medical treatment norvasc, toprol ? ?

## 2021-09-14 DIAGNOSIS — R002 Palpitations: Secondary | ICD-10-CM | POA: Diagnosis not present

## 2021-09-27 ENCOUNTER — Encounter: Payer: Self-pay | Admitting: Internal Medicine

## 2021-09-27 MED ORDER — OMEPRAZOLE 40 MG PO CPDR: DELAYED_RELEASE_CAPSULE | ORAL | 3 refills | Status: DC

## 2021-09-29 LAB — COLOGUARD

## 2021-10-04 ENCOUNTER — Telehealth: Payer: Self-pay | Admitting: *Deleted

## 2021-10-04 NOTE — Telephone Encounter (Signed)
-----   Message from Sheilah Pigeon, [place] sent at 10/01/2021  6:19 PM EDT ----- Monitor looked OK.  Symptoms events not associated with any rates too fast or slow, no AFib by Dr. Lubertha Basque read. How is he feeling?

## 2021-10-04 NOTE — Telephone Encounter (Signed)
Spoke with patient  about results verbalized understanding. Patient also states he feeling okay no major complaints and Dr Ladona Ridgel follow up 10-09-21

## 2021-10-06 DIAGNOSIS — R059 Cough, unspecified: Secondary | ICD-10-CM | POA: Diagnosis not present

## 2021-10-06 DIAGNOSIS — R509 Fever, unspecified: Secondary | ICD-10-CM | POA: Diagnosis not present

## 2021-10-09 ENCOUNTER — Ambulatory Visit (INDEPENDENT_AMBULATORY_CARE_PROVIDER_SITE_OTHER): Payer: Medicare Other | Admitting: Internal Medicine

## 2021-10-09 ENCOUNTER — Encounter: Payer: Self-pay | Admitting: Internal Medicine

## 2021-10-09 VITALS — BP 197/114 | HR 70 | Ht 75.0 in | Wt 293.0 lb

## 2021-10-09 DIAGNOSIS — I480 Paroxysmal atrial fibrillation: Secondary | ICD-10-CM | POA: Diagnosis not present

## 2021-10-09 NOTE — Patient Instructions (Addendum)
Medication Instructions:  ?Your physician recommends that you continue on your current medications as directed. Please refer to the Current Medication list given to you today. ? ?Labwork: ?None ordered. ? ?Testing/Procedures: ?None ordered. ? ?Follow-Up: ?Your physician wants you to follow-up in: one year with Gregg Taylor, MD  ?You will receive a reminder letter in the mail two months in advance. If you don't receive a letter, please call our office to schedule the follow-up appointment. ? ?Any Other Special Instructions Will Be Listed Below (If Applicable). ? ?If you need a refill on your cardiac medications before your next appointment, please call your pharmacy.  ? ?Important Information About Sugar ? ? ? ? ? ? ? ?

## 2021-10-09 NOTE — Progress Notes (Signed)
Electrophysiology TeleHealth Note   Due to national recommendations of social distancing due to COVID 19, an audio/video telehealth visit is felt to be most appropriate for this patient at this time.  See MyChart message from today for the patient's consent to telehealth for [facility].   Date:  10/09/2021   ID:  Justin Woodard, DOB 1955/09/17, MRN [id number]  Location: patient's home  Provider location: [address], [place]  Evaluation Performed: Follow-up visit  PCP:  Corwin Levins, MD  Cardiologist:  None  Electrophysiologist:  Dr Ladona Ridgel  Chief Complaint:  "I got Covid a few days ago."  History of Present Illness:    Justin Woodard is a 66 y.o. male who presents via audio/video conferencing for a telehealth visit today.  Since last being seen in our clinic, the patient reports doing very well.  Today, he denies symptoms of palpitations, chest pain, shortness of breath,  lower extremity edema, dizziness, presyncope, or syncope.  The patient is otherwise without complaint today.  The patient denies symptoms of fevers, chills, cough, or new SOB worrisome for COVID 19.  Past Medical History:  Diagnosis Date   A-fib [facility])    post PE only   Anxiety state 04/20/2007   Qualifier: Diagnosis of  By: Jonny Ruiz MD, Len Blalock    DVT (deep venous thrombosis) (HCC)    2000   Dysphagia 09/13/2016   Erectile dysfunction 05/28/2011   Eustachian tube dysfunction, right 06/25/2017   GERD (gastroesophageal reflux disease)    Hypertension    Long term current use of anticoagulant 07/03/2010   Pulmonary embolism (HCC)    Radiculitis of right cervical region 09/13/2016   Right knee pain 06/25/2017   Shingles 02/06/2012   Symptomatic PVCs     Past Surgical History:  Procedure Laterality Date   TONSILLECTOMY     VEIN SURGERY     RLE saphenous vein stripping    Current Outpatient Medications  Medication Sig Dispense Refill   amLODipine (NORVASC) 10 MG tablet Take 1 tablet (10 mg  total) by mouth daily. 90 tablet 1   Cholecalciferol (THERA-D 2000) 50 MCG (2000 UT) TABS 1 tab by mouth once daily 100 tablet 99   metoprolol succinate (TOPROL-XL) 50 MG 24 hr tablet Take 25 mg by mouth daily. Take with or immediately following a meal.     omeprazole (PRILOSEC) 40 MG capsule Take 1 capsule 40 mg by mouth daily before breakfast. 90 capsule 3   triamcinolone (NASACORT) 55 MCG/ACT AERO nasal inhaler Place 2 sprays into the nose daily. 1 each 12   warfarin (COUMADIN) 4 MG tablet TAKE 2 TABLETS BY MOUTH DAILY EXCEPT TAKE 1/2 TABLET ON TUESDAYS OR AS DIRECTED BY COAGULATION CLINIC 180 tablet 1   No current facility-administered medications for this visit.    Allergies:   Oxycodone   Social History:  The patient  reports that he has quit smoking. He has never used smokeless tobacco. He reports that he does not drink alcohol and does not use drugs.   Family History:  The patient's  family history includes Diabetes in an other family member; Heart attack in his father.   ROS:  Please see the history of present illness.   All other systems are personally reviewed and negative.    Exam:    Vital Signs:  BP (!) 197/114   Pulse 70   Ht 6\' 3"  (1.905 m)   Wt 293 lb (132.9 kg)   BMI  36.62 kg/m    Labs/Other Tests and Data Reviewed:    Recent Labs: 09/10/2021: ALT 21; BUN 11; Creatinine, Ser 1.03; Hemoglobin 14.3; Platelets 230.0; Potassium 4.2; Sodium 141; TSH 4.12   Wt Readings from Last 3 Encounters:  10/09/21 293 lb (132.9 kg)  09/10/21 294 lb (133.4 kg)  09/04/21 297 lb (134.7 kg)     Other studies personally reviewed: Additional studies/ records that were reviewed today include:   ASSESSMENT & PLAN:    1.  PAF - his symptoms are well controlled. His recent cardiac monitor was reassuring 2. Coags - he will continue coumadin. He has not had any bleeding 3. Covid 19 - he is currently infected but feels well. He is getting better.  4. Obesity - he is encouraged to  continue to lose weight. He is down 10 lbs.   COVID 19 screen The patient denies symptoms of COVID 19 at this time.  The importance of social distancing was discussed today.  Follow-up:  1 year Next remote: n/a  Current medicines are reviewed at length with the patient today.   The patient does not have concerns regarding his medicines.  The following changes were made today:  none  Labs/ tests ordered today include: none No orders of the defined types were placed in this encounter.    Patient Risk:  after full review of this patients clinical status, I feel that they are at moderate risk at this time.  Today, I have spent 15 minutes with the patient with telehealth technology discussing all of the above .    Signed, Lewayne Bunting, MD  10/09/2021 10:04 AM     [facility] [address] [place] [address] [phone] (office) [phone] (fax)

## 2021-10-19 ENCOUNTER — Ambulatory Visit (INDEPENDENT_AMBULATORY_CARE_PROVIDER_SITE_OTHER): Payer: Medicare Other

## 2021-10-19 DIAGNOSIS — Z7901 Long term (current) use of anticoagulants: Secondary | ICD-10-CM

## 2021-10-19 LAB — POCT INR: INR: 2.8 (ref 2.0–3.0)

## 2021-10-19 NOTE — Progress Notes (Signed)
Continue to take 2 tablets daily except take 1/2 tablet every Tuesday.  Recheck in 6 weeks. 

## 2021-10-19 NOTE — Patient Instructions (Addendum)
Pre visit review using our clinic review tool, if applicable. No additional management support is needed unless otherwise documented below in the visit note.   Continue to take 2 tablets daily except take 1/2 tablet every Tuesday.  Re-check in 6 weeks. 

## 2021-10-24 NOTE — Telephone Encounter (Signed)
Pt LVM reporting he needed to change his apt for coumadin clinic from 7/14 to 7/21 and a my chart msg could be sent to let him know of the time of the new apt.  Sent mychart msg.

## 2021-10-25 ENCOUNTER — Ambulatory Visit
Admission: RE | Admit: 2021-10-25 | Discharge: 2021-10-25 | Disposition: A | Discharge status: Home / Self Care | Payer: Self-pay | Source: Ambulatory Visit | Attending: Internal Medicine | Admitting: Internal Medicine

## 2021-10-25 DIAGNOSIS — E7800 Pure hypercholesterolemia, unspecified: Secondary | ICD-10-CM

## 2021-10-25 DIAGNOSIS — I10 Essential (primary) hypertension: Secondary | ICD-10-CM

## 2021-10-26 DIAGNOSIS — H2513 Age-related nuclear cataract, bilateral: Secondary | ICD-10-CM | POA: Diagnosis not present

## 2021-11-09 DIAGNOSIS — Z1211 Encounter for screening for malignant neoplasm of colon: Secondary | ICD-10-CM | POA: Diagnosis not present

## 2021-11-10 ENCOUNTER — Other Ambulatory Visit: Payer: Self-pay | Admitting: Internal Medicine

## 2021-11-10 DIAGNOSIS — Z7901 Long term (current) use of anticoagulants: Secondary | ICD-10-CM

## 2021-11-10 NOTE — Telephone Encounter (Signed)
Please ask pt to reqeust coumadin per his coumadin clinic

## 2021-11-12 NOTE — Telephone Encounter (Signed)
Pt is compliant with warfarin management and PCP apts. ?Sent in refill.  ?

## 2021-11-15 LAB — COLOGUARD: COLOGUARD: NEGATIVE

## 2021-11-30 ENCOUNTER — Ambulatory Visit: Payer: Medicare Other

## 2021-12-07 ENCOUNTER — Ambulatory Visit (INDEPENDENT_AMBULATORY_CARE_PROVIDER_SITE_OTHER): Payer: Medicare Other

## 2021-12-07 DIAGNOSIS — Z7901 Long term (current) use of anticoagulants: Secondary | ICD-10-CM | POA: Diagnosis not present

## 2021-12-07 LAB — POCT INR: INR: 2 (ref 2.0–3.0)

## 2021-12-07 NOTE — Patient Instructions (Addendum)
Pre visit review using our clinic review tool, if applicable. No additional management support is needed unless otherwise documented below in the visit note.  Continue to take 2 tablets daily except take 1/2 tablet every Tuesday.  Re-check in 6 weeks.

## 2021-12-07 NOTE — Progress Notes (Signed)
Continue to take 2 tablets daily except take 1/2 tablet every Tuesday.  Recheck in 6 weeks. 

## 2021-12-18 ENCOUNTER — Telehealth: Payer: Self-pay

## 2021-12-18 NOTE — Telephone Encounter (Signed)
Pt is scheduled for coumadin clinic on 9/1. No nurse in the office that day. LVM for pt advising he could be RS for 9/8 at 10:30.

## 2021-12-20 NOTE — Telephone Encounter (Signed)
Pt returned call and will RS for 9/8 at 10:30.

## 2022-01-17 ENCOUNTER — Telehealth: Payer: Medicare Other | Admitting: Family Medicine

## 2022-01-17 DIAGNOSIS — B9689 Other specified bacterial agents as the cause of diseases classified elsewhere: Secondary | ICD-10-CM

## 2022-01-17 DIAGNOSIS — J019 Acute sinusitis, unspecified: Secondary | ICD-10-CM

## 2022-01-17 MED ORDER — AMOXICILLIN-POT CLAVULANATE 875-125 MG PO TABS: 1.0000 | ORAL_TABLET | Freq: Two times a day (BID) | ORAL | 0 refills | Status: AC

## 2022-01-17 NOTE — Progress Notes (Signed)

## 2022-01-18 ENCOUNTER — Ambulatory Visit: Payer: Medicare Other

## 2022-01-22 ENCOUNTER — Encounter: Payer: Self-pay | Admitting: Internal Medicine

## 2022-01-22 NOTE — Telephone Encounter (Signed)
Want to make sure he is able to crush up Augmentin.Marland KitchenRaechel Chute

## 2022-01-22 NOTE — Telephone Encounter (Signed)
Sorry, I dont think that is advisable, but he can also ask the pharmacist to make sure

## 2022-01-22 NOTE — Telephone Encounter (Signed)
See below

## 2022-01-25 ENCOUNTER — Ambulatory Visit (INDEPENDENT_AMBULATORY_CARE_PROVIDER_SITE_OTHER): Payer: Medicare Other

## 2022-01-25 DIAGNOSIS — Z7901 Long term (current) use of anticoagulants: Secondary | ICD-10-CM

## 2022-01-25 LAB — POCT INR: INR: 2.1 (ref 2.0–3.0)

## 2022-01-25 NOTE — Patient Instructions (Addendum)
Pre visit review using our clinic review tool, if applicable. No additional management support is needed unless otherwise documented below in the visit note.   Continue to take 2 tablets daily except take 1/2 tablet every Tuesday.  Re-check in 6 weeks.

## 2022-01-25 NOTE — Progress Notes (Signed)
Continue to take 2 tablets daily except take 1/2 tablet every Tuesday.  Recheck in 6 weeks. 

## 2022-02-06 ENCOUNTER — Encounter: Payer: Self-pay | Admitting: Internal Medicine

## 2022-02-06 MED ORDER — AZITHROMYCIN 250 MG PO TABS: ORAL_TABLET | ORAL | 1 refills | Status: AC

## 2022-02-21 ENCOUNTER — Ambulatory Visit (INDEPENDENT_AMBULATORY_CARE_PROVIDER_SITE_OTHER): Payer: Medicare Other

## 2022-02-21 VITALS — Ht 75.0 in

## 2022-02-21 DIAGNOSIS — Z0000 Encounter for general adult medical examination without abnormal findings: Secondary | ICD-10-CM

## 2022-02-21 NOTE — Patient Instructions (Signed)
[name] , Thank you for taking time to come for your Medicare Wellness Visit. I appreciate your ongoing commitment to your health goals. Please review the following plan we discussed and let me know if I can assist you in the future.   These are the goals we discussed:  Goals      To maintain my health and get evaluated for my sleep and feet issues.        This is a list of the screening recommended for you and due dates:  Health Maintenance  Topic Date Due   Zoster (Shingles) Vaccine (1 of 2) Never done   Flu Shot  12/18/2021   Pneumonia Vaccine (1 - PCV) 09/11/2022*   Colon Cancer Screening  09/11/2022*   Tetanus Vaccine  09/11/2022*   Hepatitis C Screening: USPSTF Recommendation to screen - Ages 18-79 yo.  Completed   HPV Vaccine  Aged Out   COVID-19 Vaccine  Discontinued  *Topic was postponed. The date shown is not the original due date.    Advanced directives: No  Conditions/risks identified: Yes  Next appointment: Follow up in one year for your annual wellness visit.   Preventive Care 68 Years and Older, Male  Preventive care refers to lifestyle choices and visits with your health care provider that can promote health and wellness. What does preventive care include? A yearly physical exam. This is also called an annual well check. Dental exams once or twice a year. Routine eye exams. Ask your health care provider how often you should have your eyes checked. Personal lifestyle choices, including: Daily care of your teeth and gums. Regular physical activity. Eating a healthy diet. Avoiding tobacco and drug use. Limiting alcohol use. Practicing safe sex. Taking low doses of aspirin every day. Taking vitamin and mineral supplements as recommended by your health care provider. What happens during an annual well check? The services and screenings done by your health care provider during your annual well check will depend on your age, overall health, lifestyle risk  factors, and family history of disease. Counseling  Your health care provider may ask you questions about your: Alcohol use. Tobacco use. Drug use. Emotional well-being. Home and relationship well-being. Sexual activity. Eating habits. History of falls. Memory and ability to understand (cognition). Work and work Statistician. Screening  You may have the following tests or measurements: Height, weight, and BMI. Blood pressure. Lipid and cholesterol levels. These may be checked every 5 years, or more frequently if you are over 68 years old. Skin check. Lung cancer screening. You may have this screening every year starting at age 68 if you have a 30-pack-year history of smoking and currently smoke or have quit within the past 15 years. Fecal occult blood test (FOBT) of the stool. You may have this test every year starting at age 68. Flexible sigmoidoscopy or colonoscopy. You may have a sigmoidoscopy every 5 years or a colonoscopy every 10 years starting at age 68. Prostate cancer screening. Recommendations will vary depending on your family history and other risks. Hepatitis C blood test. Hepatitis B blood test. Sexually transmitted disease (STD) testing. Diabetes screening. This is done by checking your blood sugar (glucose) after you have not eaten for a while (fasting). You may have this done every 1-3 years. Abdominal aortic aneurysm (AAA) screening. You may need this if you are a current or former smoker. Osteoporosis. You may be screened starting at age 68 if you are at high risk. Talk with your health care  provider about your test results, treatment options, and if necessary, the need for more tests. Vaccines  Your health care provider may recommend certain vaccines, such as: Influenza vaccine. This is recommended every year. Tetanus, diphtheria, and acellular pertussis (Tdap, Td) vaccine. You may need a Td booster every 10 years. Zoster vaccine. You may need this after age  68. Pneumococcal 13-valent conjugate (PCV13) vaccine. One dose is recommended after age 68. Pneumococcal polysaccharide (PPSV23) vaccine. One dose is recommended after age 68. Talk to your health care provider about which screenings and vaccines you need and how often you need them. This information is not intended to replace advice given to you by your health care provider. Make sure you discuss any questions you have with your health care provider. Document Released: 06/02/2015 Document Revised: 01/24/2016 Document Reviewed: 03/07/2015 Elsevier Interactive Patient Education  2017 [place] Prevention in the Home Falls can cause injuries. They can happen to people of all ages. There are many things you can do to make your home safe and to help prevent falls. What can I do on the outside of my home? Regularly fix the edges of walkways and driveways and fix any cracks. Remove anything that might make you trip as you walk through a door, such as a raised step or threshold. Trim any bushes or trees on the path to your home. Use bright outdoor lighting. Clear any walking paths of anything that might make someone trip, such as rocks or tools. Regularly check to see if handrails are loose or broken. Make sure that both sides of any steps have handrails. Any raised decks and porches should have guardrails on the edges. Have any leaves, snow, or ice cleared regularly. Use sand or salt on walking paths during winter. Clean up any spills in your garage right away. This includes oil or grease spills. What can I do in the bathroom? Use night lights. Install grab bars by the toilet and in the tub and shower. Do not use towel bars as grab bars. Use non-skid mats or decals in the tub or shower. If you need to sit down in the shower, use a plastic, non-slip stool. Keep the floor dry. Clean up any water that spills on the floor as soon as it happens. Remove soap buildup in the tub or shower  regularly. Attach bath mats securely with double-sided non-slip rug tape. Do not have throw rugs and other things on the floor that can make you trip. What can I do in the bedroom? Use night lights. Make sure that you have a light by your bed that is easy to reach. Do not use any sheets or blankets that are too big for your bed. They should not hang down onto the floor. Have a firm chair that has side arms. You can use this for support while you get dressed. Do not have throw rugs and other things on the floor that can make you trip. What can I do in the kitchen? Clean up any spills right away. Avoid walking on wet floors. Keep items that you use a lot in easy-to-reach places. If you need to reach something above you, use a strong step stool that has a grab bar. Keep electrical cords out of the way. Do not use floor polish or wax that makes floors slippery. If you must use wax, use non-skid floor wax. Do not have throw rugs and other things on the floor that can make you trip. What can I  do with my stairs? Do not leave any items on the stairs. Make sure that there are handrails on both sides of the stairs and use them. Fix handrails that are broken or loose. Make sure that handrails are as long as the stairways. Check any carpeting to make sure that it is firmly attached to the stairs. Fix any carpet that is loose or worn. Avoid having throw rugs at the top or bottom of the stairs. If you do have throw rugs, attach them to the floor with carpet tape. Make sure that you have a light switch at the top of the stairs and the bottom of the stairs. If you do not have them, ask someone to add them for you. What else can I do to help prevent falls? Wear shoes that: Do not have high heels. Have rubber bottoms. Are comfortable and fit you well. Are closed at the toe. Do not wear sandals. If you use a stepladder: Make sure that it is fully opened. Do not climb a closed stepladder. Make sure that  both sides of the stepladder are locked into place. Ask someone to hold it for you, if possible. Clearly mark and make sure that you can see: Any grab bars or handrails. First and last steps. Where the edge of each step is. Use tools that help you move around (mobility aids) if they are needed. These include: Canes. Walkers. Scooters. Crutches. Turn on the lights when you go into a dark area. Replace any light bulbs as soon as they burn out. Set up your furniture so you have a clear path. Avoid moving your furniture around. If any of your floors are uneven, fix them. If there are any pets around you, be aware of where they are. Review your medicines with your doctor. Some medicines can make you feel dizzy. This can increase your chance of falling. Ask your doctor what other things that you can do to help prevent falls. This information is not intended to replace advice given to you by your health care provider. Make sure you discuss any questions you have with your health care provider. Document Released: 03/02/2009 Document Revised: 10/12/2015 Document Reviewed: 06/10/2014 Elsevier Interactive Patient Education  2017 [employer].

## 2022-02-21 NOTE — Progress Notes (Signed)
Virtual Visit via Telephone Note  I connected with  Justin Woodard on 02/21/22 at  1:45 PM EDT by telephone and verified that I am speaking with the correct person using two identifiers.  Location: Patient: Home Provider: [place] Persons participating in the virtual visit: [place]   I discussed the limitations, risks, security and privacy concerns of performing an evaluation and management service by telephone and the availability of in person appointments. The patient expressed understanding and agreed to proceed.  Interactive audio and video telecommunications were attempted between this nurse and patient, however failed, due to patient having technical difficulties OR patient did not have access to video capability.  We continued and completed visit with audio only.  Some vital signs may be absent or patient reported.   Sheral Flow, LPN  Subjective:   Justin Woodard is a 66 y.o. male who presents for an Initial Medicare Annual Wellness Visit.  Review of Systems     Cardiac Risk Factors include: advanced age (>30men, >54 women);family history of premature cardiovascular disease;hypertension;male gender;obesity (BMI >30kg/m2);sedentary lifestyle     Objective:    Today's Vitals   02/21/22 1358  Height: 6\' 3"  (1.905 m)  PainSc: 0-No pain   Body mass index is 36.62 kg/m.     02/21/2022    1:49 PM 04/16/2015   10:36 AM 01/23/2015    9:44 AM 05/17/2014    3:06 AM  Advanced Directives  Does Patient Have a Medical Advance Directive? No No No No  Would patient like information on creating a medical advance directive? No - Patient declined No - patient declined information      Current Medications (verified) Outpatient Encounter Medications as of 02/21/2022  Medication Sig   amLODipine (NORVASC) 10 MG tablet Take 1 tablet (10 mg total) by mouth daily.   Cholecalciferol (THERA-D 2000) 50 MCG (2000 UT) TABS 1 tab by mouth once daily    metoprolol succinate (TOPROL-XL) 50 MG 24 hr tablet Take 25 mg by mouth daily. Take with or immediately following a meal.   warfarin (COUMADIN) 4 MG tablet TAKE 2 TABLETS BY MOUTH DAILY EXCEPT TAKE 1/2 TABLET ON TUESDAYS OR AS DIRECTED BY COAGULATION CLINIC   omeprazole (PRILOSEC) 40 MG capsule Take 1 capsule 40 mg by mouth daily before breakfast. (Patient not taking: Reported on 02/21/2022)   triamcinolone (NASACORT) 55 MCG/ACT AERO nasal inhaler Place 2 sprays into the nose daily. (Patient not taking: Reported on 02/21/2022)   No facility-administered encounter medications on file as of 02/21/2022.    Allergies (verified) Oxycodone   History: Past Medical History:  Diagnosis Date   A-fib [facility])    post PE only   Anxiety state 04/20/2007   Qualifier: Diagnosis of  By: Jenny Reichmann MD, Hunt Oris    DVT (deep venous thrombosis) ([place])    2000   Dysphagia 09/13/2016   Erectile dysfunction 05/28/2011   Eustachian tube dysfunction, right 06/25/2017   GERD (gastroesophageal reflux disease)    Hypertension    Long term current use of anticoagulant 07/03/2010   Pulmonary embolism ([place])    Radiculitis of right cervical region 09/13/2016   Right knee pain 06/25/2017   Shingles 02/06/2012   Symptomatic PVCs    Past Surgical History:  Procedure Laterality Date   TONSILLECTOMY     VEIN SURGERY     RLE saphenous vein stripping   Family History  Problem Relation Age of Onset   Heart attack Father    Diabetes Other  first degree relatives   Social History   Socioeconomic History   Marital status: Married    Spouse name: Not on file   Number of children: 2   Years of education: Not on file   Highest education level: Not on file  Occupational History   Occupation: PARTS DEPT    Employer: UNEMPLOYED  Tobacco Use   Smoking status: Former   Smokeless tobacco: Never  Substance and Sexual Activity   Alcohol use: No    Alcohol/week: 0.0 standard drinks of alcohol   Drug use: No   Sexual activity:  Yes  Other Topics Concern   Not on file  Social History Narrative   Former Smoker   Alcohol use-no   Married   2 sons   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, broadcasting/film/video         Social Determinants of Corporate investment banker Strain: Low Risk  (02/21/2022)   Overall Financial Resource Strain (CARDIA)    Difficulty of Paying Living Expenses: Not hard at all  Food Insecurity: No Food Insecurity (02/21/2022)   Hunger Vital Sign    Worried About Running Out of Food in the Last Year: Never true    Ran Out of Food in the Last Year: Never true  Transportation Needs: No Transportation Needs (02/21/2022)   PRAPARE - Administrator, Civil Service (Medical): No    Lack of Transportation (Non-Medical): No  Physical Activity: Inactive (02/21/2022)   Exercise Vital Sign    Days of Exercise per Week: 0 days    Minutes of Exercise per Session: 0 min  Stress: No Stress Concern Present (02/21/2022)   [employer] of Occupational Health - Occupational Stress Questionnaire    Feeling of Stress : Not at all  Social Connections: Socially Integrated (02/21/2022)   Social Connection and Isolation Panel [NHANES]    Frequency of Communication with Friends and Family: More than three times a week    Frequency of Social Gatherings with Friends and Family: More than three times a week    Attends Religious Services: More than 4 times per year    Active Member of [employer] or Organizations: Yes    Attends Engineer, structural: More than 4 times per year    Marital Status: Married    Tobacco Counseling Counseling given: Not Answered   Clinical Intake:  Pre-visit preparation completed: Yes  Pain : No/denies pain Pain Score: 0-No pain     BMI - recorded: 36.62 (10/09/2021) Nutritional Status: BMI > 30  Obese Nutritional Risks: None Diabetes: No  How often do you need to have someone help you when you read instructions, pamphlets, or other written materials from your doctor or  pharmacy?: 1 - Never What is the last grade level you completed in school?: HSG  Diabetic? no  Interpreter Needed?: No  Information entered by :: Susie Cassette, LPN.   Activities of Daily Living    02/21/2022    2:02 PM  In your present state of health, do you have any difficulty performing the following activities:  Hearing? 0  Vision? 0  Difficulty concentrating or making decisions? 0  Walking or climbing stairs? 0  Dressing or bathing? 0  Doing errands, shopping? 0  Preparing Food and eating ? N  Using the Toilet? N  In the past six months, have you accidently leaked urine? N  Do you have problems with loss of bowel control? N  Managing your Medications? N  Managing your Finances? N  Housekeeping or managing your Housekeeping? N    Patient Care Team: Corwin Levins, MD as PCP - Michel Santee, OD as Consulting Physician (Optometry)  Indicate any recent Medical Services you may have received from other than [facility] providers in the past year (date may be approximate).     Assessment:   This is a routine wellness examination for [place].  Hearing/Vision screen Hearing Screening - Comments:: Denies hearing difficulties.  Vision Screening - Comments:: Wears rx glasses - up to date with routine eye exams with Blima Ledger, OD.   Dietary issues and exercise activities discussed: Current Exercise Habits: The patient does not participate in regular exercise at present, Exercise limited by: cardiac condition(s)   Goals Addressed             This Visit's Progress    To maintain my health and get evaluated for my sleep and feet issues.        Depression Screen    02/21/2022    1:52 PM 09/10/2021    1:15 PM 09/10/2021    1:00 PM 09/07/2020   10:54 AM 09/07/2020   10:22 AM 08/13/2019    3:17 PM 08/05/2018    2:51 PM  PHQ 2/9 Scores  PHQ - 2 Score 0 1 2 0 0 0 0  PHQ- 9 Score   7  0      Fall Risk    02/21/2022    1:50 PM 09/10/2021    1:15 PM 09/10/2021     1:00 PM 09/07/2020   10:54 AM 09/07/2020   10:22 AM  Fall Risk   Falls in the past year? 0 0 0 0 0  Number falls in past yr: 0 0 0  0  Injury with Fall? 0 0 0  0  Risk for fall due to : No Fall Risks    No Fall Risks  Follow up Falls prevention discussed        FALL RISK PREVENTION PERTAINING TO THE HOME:  Any stairs in or around the home? No  If so, are there any without handrails? No  Home free of loose throw rugs in walkways, pet beds, electrical cords, etc? Yes  Adequate lighting in your home to reduce risk of falls? Yes   ASSISTIVE DEVICES UTILIZED TO PREVENT FALLS:  Life alert? No  Use of a cane, walker or w/c? No  Grab bars in the bathroom? Yes  Shower chair or bench in shower? No  Elevated toilet seat or a handicapped toilet? Yes   TIMED UP AND GO:  Was the test performed? No . Phone Visit  Cognitive Function:        02/21/2022    2:03 PM  6CIT Screen  What Year? 0 points  What month? 0 points  What time? 0 points  Count back from 20 0 points  Months in reverse 0 points  Repeat phrase 0 points  Total Score 0 points    Immunizations Immunization History  Administered Date(s) Administered   Fluad Quad(high Dose 65+) 03/07/2020, 02/02/2021   Influenza,inj,Quad PF,6+ Mos 06/25/2017, 06/05/2018   PFIZER(Purple Top)SARS-COV-2 Vaccination 07/26/2019, 08/16/2019    TDAP status: Due, Education has been provided regarding the importance of this vaccine. Advised may receive this vaccine at local pharmacy or Health Dept. Aware to provide a copy of the vaccination record if obtained from local pharmacy or Health Dept. Verbalized acceptance and understanding.  Flu Vaccine status: Due, Education has been provided regarding  the importance of this vaccine. Advised may receive this vaccine at local pharmacy or Health Dept. Aware to provide a copy of the vaccination record if obtained from local pharmacy or Health Dept. Verbalized acceptance and  understanding.  Pneumococcal vaccine status: Due, Education has been provided regarding the importance of this vaccine. Advised may receive this vaccine at local pharmacy or Health Dept. Aware to provide a copy of the vaccination record if obtained from local pharmacy or Health Dept. Verbalized acceptance and understanding.  Covid-19 vaccine status: Completed vaccines  Qualifies for Shingles Vaccine? Yes   Zostavax completed No   Shingrix Completed?: No.    Education has been provided regarding the importance of this vaccine. Patient has been advised to call insurance company to determine out of pocket expense if they have not yet received this vaccine. Advised may also receive vaccine at local pharmacy or Health Dept. Verbalized acceptance and understanding.  Screening Tests Health Maintenance  Topic Date Due   Zoster Vaccines- Shingrix (1 of 2) Never done   INFLUENZA VACCINE  12/18/2021   Pneumonia Vaccine 55+ Years old (1 - PCV) 09/11/2022 (Originally 09/24/2018)   COLONOSCOPY (Pts 45-50yrs Insurance coverage will need to be confirmed)  09/11/2022 (Originally 09/24/1998)   TETANUS/TDAP  09/11/2022 (Originally 09/23/1972)   Hepatitis C Screening  Completed   HPV VACCINES  Aged Out   COVID-19 Vaccine  Discontinued    Health Maintenance  Health Maintenance Due  Topic Date Due   Zoster Vaccines- Shingrix (1 of 2) Never done   INFLUENZA VACCINE  12/18/2021    Colorectal cancer screening: Type of screening: Cologuard. Completed 11/15/2021. Repeat every 3 years  Lung Cancer Screening: (Low Dose CT Chest recommended if Age 10-80 years, 30 pack-year currently smoking OR have quit w/in 15years.) does not qualify.   Lung Cancer Screening Referral: no  Additional Screening:  Hepatitis C Screening: does qualify; Completed 06/25/2017  Vision Screening: Recommended annual ophthalmology exams for early detection of glaucoma and other disorders of the eye. Is the patient up to date with their  annual eye exam?  Yes  Who is the provider or what is the name of the office in which the patient attends annual eye exams? Blima Ledger, OD. If pt is not established with a provider, would they like to be referred to a provider to establish care? No .   Dental Screening: Recommended annual dental exams for proper oral hygiene  Community Resource Referral / Chronic Care Management: CRR required this visit?  No   CCM required this visit?  No      Plan:     I have personally reviewed and noted the following in the patient's chart:   Medical and social history Use of alcohol, tobacco or illicit drugs  Current medications and supplements including opioid prescriptions. Patient is not currently taking opioid prescriptions. Functional ability and status Nutritional status Physical activity Advanced directives List of other physicians Hospitalizations, surgeries, and ER visits in previous 12 months Vitals Screenings to include cognitive, depression, and falls Referrals and appointments  In addition, I have reviewed and discussed with patient certain preventive protocols, quality metrics, and best practice recommendations. A written personalized care plan for preventive services as well as general preventive health recommendations were provided to patient.     [name], LPN   [id number]/01/[id number]   Nurse Notes: N/A

## 2022-03-05 ENCOUNTER — Ambulatory Visit (INDEPENDENT_AMBULATORY_CARE_PROVIDER_SITE_OTHER): Payer: Medicare Other

## 2022-03-05 DIAGNOSIS — Z7901 Long term (current) use of anticoagulants: Secondary | ICD-10-CM | POA: Diagnosis not present

## 2022-03-05 LAB — POCT INR: INR: 2.2 (ref 2.0–3.0)

## 2022-03-05 NOTE — Progress Notes (Signed)
Continue to take 2 tablets daily except take 1/2 tablet every Tuesday.  Recheck in 6 weeks. 

## 2022-03-05 NOTE — Patient Instructions (Signed)
Continue to take 2 tablets daily except take 1/2 tablet every Tuesday.  Recheck in 6 weeks.

## 2022-03-08 ENCOUNTER — Ambulatory Visit: Payer: Medicare Other

## 2022-03-13 ENCOUNTER — Encounter: Payer: Self-pay | Admitting: Internal Medicine

## 2022-03-13 ENCOUNTER — Ambulatory Visit (INDEPENDENT_AMBULATORY_CARE_PROVIDER_SITE_OTHER): Payer: Medicare Other | Admitting: Internal Medicine

## 2022-03-13 VITALS — BP 160/102 | HR 72 | Temp 97.7°F | Ht 75.0 in | Wt 288.0 lb

## 2022-03-13 DIAGNOSIS — R351 Nocturia: Secondary | ICD-10-CM | POA: Diagnosis not present

## 2022-03-13 DIAGNOSIS — R739 Hyperglycemia, unspecified: Secondary | ICD-10-CM

## 2022-03-13 DIAGNOSIS — Z23 Encounter for immunization: Secondary | ICD-10-CM

## 2022-03-13 DIAGNOSIS — E7800 Pure hypercholesterolemia, unspecified: Secondary | ICD-10-CM | POA: Diagnosis not present

## 2022-03-13 DIAGNOSIS — I10 Essential (primary) hypertension: Secondary | ICD-10-CM | POA: Diagnosis not present

## 2022-03-13 DIAGNOSIS — E559 Vitamin D deficiency, unspecified: Secondary | ICD-10-CM | POA: Diagnosis not present

## 2022-03-13 MED ORDER — VITAMIN D3 50 MCG (2000 UT) PO CAPS: 4000.0000 [IU] | ORAL_CAPSULE | Freq: Every day | ORAL | 99 refills | Status: AC

## 2022-03-13 MED ORDER — LOSARTAN POTASSIUM 50 MG PO TABS: 50.0000 mg | ORAL_TABLET | Freq: Every day | ORAL | 3 refills | Status: DC

## 2022-03-13 MED ORDER — ROSUVASTATIN CALCIUM 20 MG PO TABS: 20.0000 mg | ORAL_TABLET | Freq: Every day | ORAL | 3 refills | Status: DC

## 2022-03-13 NOTE — Assessment & Plan Note (Signed)
Last vitamin D Lab Results  Component Value Date   VD25OH 26.79 (L) 09/10/2021   Low, reminded to start oral replacement

## 2022-03-13 NOTE — Patient Instructions (Signed)
You had the flu shot today  Please take all new medication as prescribed - the losartan 50 mg for BP, and crestor 20 mg for cholesterol  Please let us know if you would want the Urology referral, or try Flomax or Vesicare  Please continue all other medications as before, and refills have been done if requested.  Please have the pharmacy call with any other refills you may need.  Please continue your efforts at being more active, low cholesterol diet, and weight control.  Please keep your appointments with your specialists as you may have planned   We can hold on further lab testing today  Please make an Appointment to return in 6 months, or sooner if needed, also with Lab Appointment for testing done 3-5 days before at the [facility] (so this is for TWO appointments - please see the scheduling desk as you leave)

## 2022-03-13 NOTE — Progress Notes (Unsigned)
Patient ID: Justin Woodard, male   DOB: 05-Sep-1955, 66 y.o.   MRN: [id number]        Chief Complaint: follow up HTN, HLD, low vit d and nocturia, hyperglyemia       HPI:  Justin Woodard is a 66 y.o. male here overall doing ok, Pt denies chest pain, increased sob or doe, wheezing, orthopnea, PND, increased LE swelling, palpitations, dizziness or syncope.   Pt denies polydipsia, polyuria, or new focal neuro s/s.    Pt denies fever, night sweats, loss of appetite, or other constitutional symptoms  Taking 2000 u vit d. Peak wt has been 302 about 2 yrs ago, tyring to lose gradually.   Due for flu shot   BP has been mild to mod elevated recently despite wt loss.  Denies urinary symptoms such as dysuria, urgency, flank pain, hematuria or n/v, fever, chills.but has urinary frequency and some voiding hesitancy, mild, does not want tx at this time. Due for flu shot Wt Readings from Last 3 Encounters:  03/13/22 288 lb (130.6 kg)  10/09/21 293 lb (132.9 kg)  09/10/21 294 lb (133.4 kg)   BP Readings from Last 3 Encounters:  03/13/22 (!) 160/102  10/09/21 (!) 197/114  09/10/21 120/70         Past Medical History:  Diagnosis Date   A-fib [facility])    post PE only   Anxiety state 04/20/2007   Qualifier: Diagnosis of  By: Jenny Reichmann MD, Hunt Oris    DVT (deep venous thrombosis) ([place])    2000   Dysphagia 09/13/2016   Erectile dysfunction 05/28/2011   Eustachian tube dysfunction, right 06/25/2017   GERD (gastroesophageal reflux disease)    Hypertension    Long term current use of anticoagulant 07/03/2010   Pulmonary embolism ([place])    Radiculitis of right cervical region 09/13/2016   Right knee pain 06/25/2017   Shingles 02/06/2012   Symptomatic PVCs    Past Surgical History:  Procedure Laterality Date   BREAST SURGERY     TONSILLECTOMY     VEIN SURGERY     RLE saphenous vein stripping    reports that he has quit smoking. He has never used smokeless tobacco. He reports that he does not drink alcohol and does not use  drugs. family history includes Diabetes in an other family member; Heart attack in his father. Allergies  Allergen Reactions   Oxycodone Other (See Comments)    "Just made me feel bad"   Current Outpatient Medications on File Prior to Visit  Medication Sig Dispense Refill   amLODipine (NORVASC) 10 MG tablet Take 1 tablet (10 mg total) by mouth daily. 90 tablet 1   metoprolol succinate (TOPROL-XL) 50 MG 24 hr tablet Take 25 mg by mouth daily. Take with or immediately following a meal.     omeprazole (PRILOSEC) 40 MG capsule Take 1 capsule 40 mg by mouth daily before breakfast. 90 capsule 3   triamcinolone (NASACORT) 55 MCG/ACT AERO nasal inhaler Place 2 sprays into the nose daily. 1 each 12   warfarin (COUMADIN) 4 MG tablet TAKE 2 TABLETS BY MOUTH DAILY EXCEPT TAKE 1/2 TABLET ON TUESDAYS OR AS DIRECTED BY COAGULATION CLINIC 285 tablet 1   No current facility-administered medications on file prior to visit.        ROS:  All others reviewed and negative.  Objective        PE:  BP (!) 160/102   Pulse 72   Temp 97.7 F (36.5 C)  Ht 6\' 3"  (1.905 m)   Wt 288 lb (130.6 kg)   SpO2 97%   BMI 36.00 kg/m                 Constitutional: Pt appears in NAD               HENT: Head: NCAT.                Right Ear: External ear normal.                 Left Ear: External ear normal.                Eyes: . Pupils are equal, round, and reactive to light. Conjunctivae and EOM are normal               Nose: without d/c or deformity               Neck: Neck supple. Gross normal ROM               Cardiovascular: Normal rate and regular rhythm.                 Pulmonary/Chest: Effort normal and breath sounds without rales or wheezing.                Abd:  Soft, NT, ND, + BS, no organomegaly               Neurological: Pt is alert. At baseline orientation, motor grossly intact               Skin: Skin is warm. No rashes, no other new lesions, LE edema - trace bilateral               Psychiatric: Pt  behavior is normal without agitation   Micro: none  Cardiac tracings I have personally interpreted today:  none  Pertinent Radiological findings (summarize): none   Lab Results  Component Value Date   WBC 5.6 09/10/2021   HGB 14.3 09/10/2021   HCT 41.2 09/10/2021   PLT 230.0 09/10/2021   GLUCOSE 95 03/14/2022   CHOL 203 (H) 03/14/2022   TRIG 176.0 (H) 03/14/2022   HDL 41.50 03/14/2022   LDLDIRECT 124.0 09/10/2021   LDLCALC 126 (H) 03/14/2022   ALT 22 03/14/2022   AST 18 03/14/2022   NA 139 03/14/2022   K 3.4 (L) 03/14/2022   CL 100 03/14/2022   CREATININE 0.95 03/14/2022   BUN 12 03/14/2022   CO2 32 03/14/2022   TSH 4.12 09/10/2021   PSA 2.10 09/10/2021   INR 2.2 03/05/2022   HGBA1C 5.8 03/14/2022   Assessment/Plan:  Justin Woodard is a 66 y.o. White or Caucasian [1] male with  has a past medical history of A-fib [facility]), Anxiety state (04/20/2007), DVT (deep venous thrombosis) ([facility]), Dysphagia (09/13/2016), Erectile dysfunction (05/28/2011), Eustachian tube dysfunction, right (06/25/2017), GERD (gastroesophageal reflux disease), Hypertension, Long term current use of anticoagulant (07/03/2010), Pulmonary embolism ([facility]), Radiculitis of right cervical region (09/13/2016), Right knee pain (06/25/2017), Shingles (02/06/2012), and Symptomatic PVCs.  Vitamin D deficiency Last vitamin D Lab Results  Component Value Date   VD25OH 26.79 (L) 09/10/2021   Low, ok to increase oral replacement to 4000 u qd   Nocturia Mild worsening recent, exam benign, delicnes urology referral, or flomax or vesicare trials  Hyperglycemia Lab Results  Component Value Date   HGBA1C 5.8 03/14/2022   Stable, pt to continue current medical treatment  -  diet, wt control, excercise   HLD (hyperlipidemia) Lab Results  Component Value Date   LDLCALC 126 (H) 03/14/2022   Uncontrolled, goal ldl < 70,, pt to start crestor 20 mg qd   Hypertension, uncontrolled BP Readings from Last 3 Encounters:   03/13/22 (!) 160/102  10/09/21 (!) 197/114  09/10/21 120/70   uncontroleld, pt to continue medical treatment norvasc 10 mg qd, toprol xl 50 qd, and add losartan 50 mg qd  Followup: Return in about 6 months (around 09/12/2022).  Oliver Barre, MD 03/14/2022 8:57 PM [facility] Internal Medicine

## 2022-03-14 ENCOUNTER — Encounter: Payer: Self-pay | Admitting: Internal Medicine

## 2022-03-14 LAB — HEPATIC FUNCTION PANEL
ALT: 22 U/L (ref 0–53)
AST: 18 U/L (ref 0–37)
Albumin: 4 g/dL (ref 3.5–5.2)
Alkaline Phosphatase: 71 U/L (ref 39–117)
Bilirubin, Direct: 0.3 mg/dL (ref 0.0–0.3)
Total Bilirubin: 1.4 mg/dL — ABNORMAL HIGH (ref 0.2–1.2)
Total Protein: 7 g/dL (ref 6.0–8.3)

## 2022-03-14 LAB — LIPID PANEL
Cholesterol: 203 mg/dL — ABNORMAL HIGH (ref 0–200)
HDL: 41.5 mg/dL (ref >39.00)
LDL Cholesterol: 126 mg/dL — ABNORMAL HIGH (ref 0–99)
NonHDL: 161.14
Total CHOL/HDL Ratio: 5
Triglycerides: 176 mg/dL — ABNORMAL HIGH (ref 0.0–149.0)
VLDL: 35.2 mg/dL (ref 0.0–40.0)

## 2022-03-14 LAB — URINALYSIS, ROUTINE W REFLEX MICROSCOPIC
Bilirubin Urine: NEGATIVE
Hgb urine dipstick: NEGATIVE
Ketones, ur: NEGATIVE
Leukocytes,Ua: NEGATIVE
Nitrite: NEGATIVE
RBC / HPF: NONE SEEN (ref 0–?)
Specific Gravity, Urine: <=1.005 — AB (ref 1.000–1.030)
Total Protein, Urine: NEGATIVE
Urine Glucose: NEGATIVE
Urobilinogen, UA: 0.2 (ref 0.0–1.0)
pH: 8 (ref 5.0–8.0)

## 2022-03-14 LAB — BASIC METABOLIC PANEL
BUN: 12 mg/dL (ref 6–23)
CO2: 32 mEq/L (ref 19–32)
Calcium: 8.8 mg/dL (ref 8.4–10.5)
Chloride: 100 mEq/L (ref 96–112)
Creatinine, Ser: 0.95 mg/dL (ref 0.40–1.50)
GFR: 82.27 mL/min (ref >60.00)
Glucose, Bld: 95 mg/dL (ref 70–99)
Potassium: 3.4 mEq/L — ABNORMAL LOW (ref 3.5–5.1)
Sodium: 139 mEq/L (ref 135–145)

## 2022-03-14 LAB — HEMOGLOBIN A1C: Hgb A1c MFr Bld: 5.8 % (ref 4.6–6.5)

## 2022-03-14 NOTE — Assessment & Plan Note (Signed)
Lab Results  Component Value Date   LDLCALC 126 (H) 03/14/2022   Uncontrolled, goal ldl < 70,, pt to start crestor 20 mg qd

## 2022-03-14 NOTE — Assessment & Plan Note (Signed)
BP Readings from Last 3 Encounters:  03/13/22 (!) 160/102  10/09/21 (!) 197/114  09/10/21 120/70   uncontroleld, pt to continue medical treatment norvasc 10 mg qd, toprol xl 50 qd, and add losartan 50 mg qd

## 2022-03-14 NOTE — Assessment & Plan Note (Signed)
Mild worsening recent, exam benign, delicnes urology referral, or flomax or vesicare trials

## 2022-03-14 NOTE — Assessment & Plan Note (Signed)
Lab Results  Component Value Date   HGBA1C 5.8 03/14/2022   Stable, pt to continue current medical treatment  - diet, wt control, excercise

## 2022-04-16 ENCOUNTER — Ambulatory Visit (INDEPENDENT_AMBULATORY_CARE_PROVIDER_SITE_OTHER): Payer: Medicare Other

## 2022-04-16 DIAGNOSIS — Z7901 Long term (current) use of anticoagulants: Secondary | ICD-10-CM | POA: Diagnosis not present

## 2022-04-16 LAB — POCT INR: INR: 3.5 — AB (ref 2.0–3.0)

## 2022-04-16 NOTE — Progress Notes (Signed)
Hold today's dose.  Then continue to take 2 tablets daily except take 1/2 tablet every Tuesday.  Recheck in 3 weeks.

## 2022-04-16 NOTE — Patient Instructions (Addendum)
Hold today's dose.  Then continue to take 2 tablets daily except take 1/2 tablet every Tuesday.  Recheck in 3 weeks, on 05/07/22 at 10:30.

## 2022-05-07 ENCOUNTER — Ambulatory Visit (INDEPENDENT_AMBULATORY_CARE_PROVIDER_SITE_OTHER): Payer: Medicare Other

## 2022-05-07 DIAGNOSIS — Z7901 Long term (current) use of anticoagulants: Secondary | ICD-10-CM | POA: Diagnosis not present

## 2022-05-07 LAB — POCT INR: INR: 3.8 — AB (ref 2.0–3.0)

## 2022-05-07 NOTE — Progress Notes (Addendum)
Pt started rosuvastatin on 03/13/22 which explains why the INR was elevated at last visit and this visit. This does interact with warfarin. Hold dose today and then change weekly dose to take 2 tablets daily except take 1 tablet on Mondays, Wednesdays and Fridays. Recheck in 2 weeks.

## 2022-05-07 NOTE — Patient Instructions (Addendum)
Pre visit review using our clinic review tool, if applicable. No additional management support is needed unless otherwise documented below in the visit note.  Hold dose today and then change weekly dose to take 2 tablets daily except take 1 tablet on Mondays, Wednesdays and Fridays. Recheck in 2 weeks.

## 2022-05-12 ENCOUNTER — Emergency Department (HOSPITAL_BASED_OUTPATIENT_CLINIC_OR_DEPARTMENT_OTHER)
Admission: EM | Admit: 2022-05-12 | Discharge: 2022-05-12 | Disposition: A | Discharge status: Home / Self Care | Payer: Medicare Other | Source: Home / Self Care

## 2022-05-21 ENCOUNTER — Ambulatory Visit (INDEPENDENT_AMBULATORY_CARE_PROVIDER_SITE_OTHER): Payer: Medicare Other

## 2022-05-21 DIAGNOSIS — Z7901 Long term (current) use of anticoagulants: Secondary | ICD-10-CM | POA: Diagnosis not present

## 2022-05-21 LAB — POCT INR: INR: 2.4 (ref 2.0–3.0)

## 2022-05-21 NOTE — Patient Instructions (Addendum)
Pre visit review using our clinic review tool, if applicable. No additional management support is needed unless otherwise documented below in the visit note.  Continue 2 tablets daily except take 1 tablet on Mondays, Wednesdays and Fridays. Recheck in 6 weeks.  

## 2022-05-21 NOTE — Progress Notes (Signed)
Continue 2 tablets daily except take 1 tablet on Mondays, Wednesdays and Fridays. Recheck in 6 weeks.  

## 2022-07-02 ENCOUNTER — Ambulatory Visit (INDEPENDENT_AMBULATORY_CARE_PROVIDER_SITE_OTHER): Payer: Medicare Other

## 2022-07-02 DIAGNOSIS — Z7901 Long term (current) use of anticoagulants: Secondary | ICD-10-CM | POA: Diagnosis not present

## 2022-07-02 LAB — POCT INR: INR: 2.3 (ref 2.0–3.0)

## 2022-07-02 NOTE — Progress Notes (Signed)
Continue 2 tablets daily except take 1 tablet on Mondays, Wednesdays and Fridays. Recheck in 6 weeks.

## 2022-07-02 NOTE — Patient Instructions (Addendum)
Pre visit review using our clinic review tool, if applicable. No additional management support is needed unless otherwise documented below in the visit note.  Continue 2 tablets daily except take 1 tablet on Mondays, Wednesdays and Fridays. Recheck in 6 weeks.

## 2022-07-13 ENCOUNTER — Other Ambulatory Visit: Payer: Self-pay | Admitting: Internal Medicine

## 2022-07-13 NOTE — Telephone Encounter (Signed)
Please refill as per office routine med refill policy (all routine meds to be refilled for 3 mo or monthly (per pt preference) up to one year from last visit, then month to month grace period for 3 mo, then further med refills will have to be denied) ? ?

## 2022-08-09 ENCOUNTER — Other Ambulatory Visit: Payer: Self-pay | Admitting: Internal Medicine

## 2022-08-09 DIAGNOSIS — Z7901 Long term (current) use of anticoagulants: Secondary | ICD-10-CM

## 2022-08-09 NOTE — Telephone Encounter (Signed)
Pt is compliant with warfarin management and PCP apts.  Sent in refill of warfarin to requested pharmacy.      

## 2022-08-13 ENCOUNTER — Ambulatory Visit (INDEPENDENT_AMBULATORY_CARE_PROVIDER_SITE_OTHER): Payer: Medicare Other

## 2022-08-13 DIAGNOSIS — Z7901 Long term (current) use of anticoagulants: Secondary | ICD-10-CM | POA: Diagnosis not present

## 2022-08-13 LAB — POCT INR: INR: 2.3 (ref 2.0–3.0)

## 2022-08-13 NOTE — Progress Notes (Signed)
Continue 2 tablets daily except take 1 tablet on Mondays, Wednesdays and Fridays. Recheck in 6 weeks.  

## 2022-08-13 NOTE — Patient Instructions (Addendum)
Pre visit review using our clinic review tool, if applicable. No additional management support is needed unless otherwise documented below in the visit note.  Continue 2 tablets daily except take 1 tablet on Mondays, Wednesdays and Fridays. Recheck in 6 weeks.  

## 2022-08-22 ENCOUNTER — Encounter: Payer: Self-pay | Admitting: Internal Medicine

## 2022-08-23 MED ORDER — AMLODIPINE BESYLATE 10 MG PO TABS: 10.0000 mg | ORAL_TABLET | Freq: Every day | ORAL | 0 refills | Status: DC

## 2022-09-09 ENCOUNTER — Other Ambulatory Visit (INDEPENDENT_AMBULATORY_CARE_PROVIDER_SITE_OTHER): Payer: Medicare Other

## 2022-09-09 DIAGNOSIS — R739 Hyperglycemia, unspecified: Secondary | ICD-10-CM

## 2022-09-09 DIAGNOSIS — E559 Vitamin D deficiency, unspecified: Secondary | ICD-10-CM | POA: Diagnosis not present

## 2022-09-09 DIAGNOSIS — E538 Deficiency of other specified B group vitamins: Secondary | ICD-10-CM | POA: Diagnosis not present

## 2022-09-09 DIAGNOSIS — E7800 Pure hypercholesterolemia, unspecified: Secondary | ICD-10-CM | POA: Diagnosis not present

## 2022-09-09 DIAGNOSIS — Z0001 Encounter for general adult medical examination with abnormal findings: Secondary | ICD-10-CM

## 2022-09-09 LAB — URINALYSIS, ROUTINE W REFLEX MICROSCOPIC
Bilirubin Urine: NEGATIVE
Hgb urine dipstick: NEGATIVE
Ketones, ur: NEGATIVE
Leukocytes,Ua: NEGATIVE
Nitrite: NEGATIVE
RBC / HPF: NONE SEEN (ref 0–?)
Specific Gravity, Urine: <=1.005 — AB (ref 1.000–1.030)
Total Protein, Urine: NEGATIVE
Urine Glucose: NEGATIVE
Urobilinogen, UA: 0.2 (ref 0.0–1.0)
pH: 6.5 (ref 5.0–8.0)

## 2022-09-09 LAB — HEPATIC FUNCTION PANEL
ALT: 27 U/L (ref 0–53)
AST: 23 U/L (ref 0–37)
Albumin: 3.9 g/dL (ref 3.5–5.2)
Alkaline Phosphatase: 70 U/L (ref 39–117)
Bilirubin, Direct: 0.2 mg/dL (ref 0.0–0.3)
Total Bilirubin: 0.9 mg/dL (ref 0.2–1.2)
Total Protein: 6.4 g/dL (ref 6.0–8.3)

## 2022-09-09 LAB — CBC WITH DIFFERENTIAL/PLATELET
Basophils Absolute: 0.1 10*3/uL (ref 0.0–0.1)
Basophils Relative: 1.1 % (ref 0.0–3.0)
Eosinophils Absolute: 0.2 10*3/uL (ref 0.0–0.7)
Eosinophils Relative: 3.2 % (ref 0.0–5.0)
HCT: 39.3 % (ref 39.0–52.0)
Hemoglobin: 13.5 g/dL (ref 13.0–17.0)
Lymphocytes Relative: 31 % (ref 12.0–46.0)
Lymphs Abs: 1.8 10*3/uL (ref 0.7–4.0)
MCHC: 34.3 g/dL (ref 30.0–36.0)
MCV: 85.3 fl (ref 78.0–100.0)
Monocytes Absolute: 0.4 10*3/uL (ref 0.1–1.0)
Monocytes Relative: 7.6 % (ref 3.0–12.0)
Neutro Abs: 3.3 10*3/uL (ref 1.4–7.7)
Neutrophils Relative %: 57.1 % (ref 43.0–77.0)
Platelets: 225 10*3/uL (ref 150.0–400.0)
RBC: 4.61 Mil/uL (ref 4.22–5.81)
RDW: 13.1 % (ref 11.5–15.5)
WBC: 5.8 10*3/uL (ref 4.0–10.5)

## 2022-09-09 LAB — MICROALBUMIN / CREATININE URINE RATIO
Creatinine,U: 36.9 mg/dL
Microalb Creat Ratio: 1.9 mg/g (ref 0.0–30.0)
Microalb, Ur: <0.7 mg/dL (ref 0.0–1.9)

## 2022-09-09 LAB — BASIC METABOLIC PANEL
BUN: 13 mg/dL (ref 6–23)
CO2: 30 mEq/L (ref 19–32)
Calcium: 8.9 mg/dL (ref 8.4–10.5)
Chloride: 104 mEq/L (ref 96–112)
Creatinine, Ser: 1.23 mg/dL (ref 0.40–1.50)
GFR: 60.13 mL/min (ref >60.00)
Glucose, Bld: 85 mg/dL (ref 70–99)
Potassium: 4.3 mEq/L (ref 3.5–5.1)
Sodium: 141 mEq/L (ref 135–145)

## 2022-09-09 LAB — LIPID PANEL
Cholesterol: 115 mg/dL (ref 0–200)
HDL: 36.2 mg/dL — ABNORMAL LOW (ref >39.00)
LDL Cholesterol: 43 mg/dL (ref 0–99)
NonHDL: 78.89
Total CHOL/HDL Ratio: 3
Triglycerides: 180 mg/dL — ABNORMAL HIGH (ref 0.0–149.0)
VLDL: 36 mg/dL (ref 0.0–40.0)

## 2022-09-09 LAB — TSH: TSH: 3.27 u[IU]/mL (ref 0.35–5.50)

## 2022-09-09 LAB — VITAMIN B12: Vitamin B-12: 274 pg/mL (ref 211–911)

## 2022-09-09 LAB — PSA: PSA: 2.37 ng/mL (ref 0.10–4.00)

## 2022-09-09 LAB — HEMOGLOBIN A1C: Hgb A1c MFr Bld: 5.9 % (ref 4.6–6.5)

## 2022-09-09 LAB — VITAMIN D 25 HYDROXY (VIT D DEFICIENCY, FRACTURES): VITD: 33.09 ng/mL (ref 30.00–100.00)

## 2022-09-12 ENCOUNTER — Ambulatory Visit: Payer: Medicare Other | Admitting: Internal Medicine

## 2022-09-16 ENCOUNTER — Encounter: Payer: Self-pay | Admitting: Internal Medicine

## 2022-09-16 ENCOUNTER — Ambulatory Visit (INDEPENDENT_AMBULATORY_CARE_PROVIDER_SITE_OTHER): Payer: Medicare Other | Admitting: Internal Medicine

## 2022-09-16 VITALS — BP 124/82 | HR 64 | Temp 98.1°F | Ht 75.0 in | Wt 299.0 lb

## 2022-09-16 DIAGNOSIS — E538 Deficiency of other specified B group vitamins: Secondary | ICD-10-CM | POA: Diagnosis not present

## 2022-09-16 DIAGNOSIS — E7800 Pure hypercholesterolemia, unspecified: Secondary | ICD-10-CM | POA: Diagnosis not present

## 2022-09-16 DIAGNOSIS — R739 Hyperglycemia, unspecified: Secondary | ICD-10-CM | POA: Diagnosis not present

## 2022-09-16 DIAGNOSIS — J309 Allergic rhinitis, unspecified: Secondary | ICD-10-CM

## 2022-09-16 DIAGNOSIS — I10 Essential (primary) hypertension: Secondary | ICD-10-CM

## 2022-09-16 DIAGNOSIS — Z0001 Encounter for general adult medical examination with abnormal findings: Secondary | ICD-10-CM | POA: Diagnosis not present

## 2022-09-16 DIAGNOSIS — E559 Vitamin D deficiency, unspecified: Secondary | ICD-10-CM

## 2022-09-16 DIAGNOSIS — R1314 Dysphagia, pharyngoesophageal phase: Secondary | ICD-10-CM

## 2022-09-16 DIAGNOSIS — K219 Gastro-esophageal reflux disease without esophagitis: Secondary | ICD-10-CM | POA: Diagnosis not present

## 2022-09-16 NOTE — Patient Instructions (Signed)
Please take OTC Vitamin D3 at 4000 units per day, indefinitely, and also start Otc B12 1000 mcg per day for at least 6 months  Ok to restart the Nasacort for the allergies  Please continue all other medications as before, and refills have been done if requested.  Please have the pharmacy call with any other refills you may need.  Please continue your efforts at being more active, low cholesterol diet, and weight control.  You are otherwise up to date with prevention measures today.  Please keep your appointments with your specialists as you may have planned  Please make an Appointment to return in 6 months, or sooner if needed, also with Lab Appointment for testing done 3-5 days before at the FIRST FLOOR Lab (so this is for TWO appointments - please see the scheduling desk as you leave)

## 2022-09-16 NOTE — Progress Notes (Unsigned)
Patient ID: Justin Woodard, male   DOB: 01-17-1956, 67 y.o.   MRN: [id number]         Chief Complaint:: wellness exam and gerd, dysphagia, chronic anticoagulation, low b12, low vit d, allergies, hyperglycemia       HPI:  Justin Woodard is a 67 y.o. male here for wellness exam; declines all immunizations including tdap, shingrix, pneumovax; also declines colonoscopy o/w up tod ate                        Also has hx of DVT occurring with holding coumadin in past, does not want this again and very hesitant to consider stopping for any procedure such as colonoscopy.  Has 2 mo worsening reflux, but no abd pain, n/v, bowel change or blood, asks to restart the omeprazole. Also has mild dysphgia recurring s/p EGD with dilation in past, but declines GI f/u for now, wants to try PPI use first.  Taking Vit D 2000 u qd  Not taking B12.  Pt denies chest pain, increased sob or doe, wheezing, orthopnea, PND, increased LE swelling, palpitations, dizziness or syncope.   Pt denies polydipsia, polyuria, or new focal neuro s/s.    Pt denies fever, wt loss, night sweats, loss of appetite, or other constitutional symptoms  Does have several wks ongoing nasal allergy symptoms with clearish congestion, itch and sneezing, without fever, pain, ST, cough, swelling or wheezing.  Wt Readings from Last 3 Encounters:  09/16/22 299 lb (135.6 kg)  03/13/22 288 lb (130.6 kg)  10/09/21 293 lb (132.9 kg)   BP Readings from Last 3 Encounters:  09/16/22 124/82  03/13/22 (!) 160/102  10/09/21 (!) 197/114   Immunization History  Administered Date(s) Administered   Fluad Quad(high Dose 65+) 03/07/2020, 02/02/2021, 03/13/2022   Influenza,inj,Quad PF,6+ Mos 06/25/2017, 06/05/2018   PFIZER(Purple Top)SARS-COV-2 Vaccination 07/26/2019, 08/16/2019   Health Maintenance Due  Topic Date Due   DTaP/Tdap/Td (1 - Tdap) Never done   Zoster Vaccines- Shingrix (1 of 2) Never done   Pneumonia Vaccine 80+ Years old (1 of 1 - PCV) Never done       Past Medical History:  Diagnosis Date   A-fib (HCC)    post PE only   Anxiety state 04/20/2007   Qualifier: Diagnosis of  By: Jonny Ruiz MD, Len Blalock    DVT (deep venous thrombosis) (HCC)    2000   Dysphagia 09/13/2016   Erectile dysfunction 05/28/2011   Eustachian tube dysfunction, right 06/25/2017   GERD (gastroesophageal reflux disease)    Hypertension    Long term current use of anticoagulant 07/03/2010   Pulmonary embolism (HCC)    Radiculitis of right cervical region 09/13/2016   Right knee pain 06/25/2017   Shingles 02/06/2012   Symptomatic PVCs    Past Surgical History:  Procedure Laterality Date   BREAST SURGERY     TONSILLECTOMY     VEIN SURGERY     RLE saphenous vein stripping    reports that he has quit smoking. He has never used smokeless tobacco. He reports that he does not drink alcohol and does not use drugs. family history includes Diabetes in an other family member; Heart attack in his father. Allergies  Allergen Reactions   Oxycodone Other (See Comments)    "Just made me feel bad"   Current Outpatient Medications on File Prior to Visit  Medication Sig Dispense Refill   amLODipine (NORVASC) 10 MG tablet Take 1 tablet (10 mg total)  by mouth daily. 90 tablet 0   Cholecalciferol (VITAMIN D3) 50 MCG (2000 UT) capsule Take 2 capsules (4,000 Units total) by mouth daily. 99 capsule 99   losartan (COZAAR) 50 MG tablet Take 1 tablet (50 mg total) by mouth daily. 90 tablet 3   metoprolol succinate (TOPROL-XL) 50 MG 24 hr tablet Take 25 mg by mouth daily. Take with or immediately following a meal.     omeprazole (PRILOSEC) 40 MG capsule Take 1 capsule 40 mg by mouth daily before breakfast. 90 capsule 3   rosuvastatin (CRESTOR) 20 MG tablet Take 1 tablet (20 mg total) by mouth daily. 90 tablet 3   triamcinolone (NASACORT) 55 MCG/ACT AERO nasal inhaler Place 2 sprays into the nose daily. 1 each 12   warfarin (COUMADIN) 4 MG tablet TAKE 2 TABLETS BY MOUTH DAILY EXCEPT TAKE 1 TABLET  ON MONDAYS, WEDNESDAYS AND FRIDAYS OR AS DIRECTED BY COAGULATION CLINIC 150 tablet 1   No current facility-administered medications on file prior to visit.        ROS:  All others reviewed and negative.  Objective        PE:  BP 124/82 (BP Location: Right Arm, Patient Position: Sitting, Cuff Size: Normal)   Pulse 64   Temp 98.1 F (36.7 C) (Oral)   Ht 6\' 3"  (1.905 m)   Wt 299 lb (135.6 kg)   SpO2 98%   BMI 37.37 kg/m                 Constitutional: Pt appears in NAD               HENT: Head: NCAT.                Right Ear: External ear normal.                 Left Ear: External ear normal. Bilat tm's with mild erythema.  Max sinus areas non tender.  Pharynx with mild erythema, no exudate               Eyes: . Pupils are equal, round, and reactive to light. Conjunctivae and EOM are normal               Nose: without d/c or deformity               Neck: Neck supple. Gross normal ROM               Cardiovascular: Normal rate and regular rhythm.                 Pulmonary/Chest: Effort normal and breath sounds without rales or wheezing.                Abd:  Soft, NT, ND, + BS, no organomegaly               Neurological: Pt is alert. At baseline orientation, motor grossly intact               Skin: Skin is warm. No rashes, no other new lesions, LE edema - none               Psychiatric: Pt behavior is normal without agitation   Micro: none  Cardiac tracings I have personally interpreted today:  none  Pertinent Radiological findings (summarize): none   Lab Results  Component Value Date   WBC 5.8 09/09/2022   HGB 13.5 09/09/2022   HCT 39.3 09/09/2022   PLT 225.0  09/09/2022   GLUCOSE 85 09/09/2022   CHOL 115 09/09/2022   TRIG 180.0 (H) 09/09/2022   HDL 36.20 (L) 09/09/2022   LDLDIRECT 124.0 09/10/2021   LDLCALC 43 09/09/2022   ALT 27 09/09/2022   AST 23 09/09/2022   NA 141 09/09/2022   K 4.3 09/09/2022   CL 104 09/09/2022   CREATININE 1.23 09/09/2022   BUN 13 09/09/2022    CO2 30 09/09/2022   TSH 3.27 09/09/2022   PSA 2.37 09/09/2022   INR 2.3 08/13/2022   HGBA1C 5.9 09/09/2022   MICROALBUR <0.7 09/09/2022   Assessment/Plan:  Justin Woodard is a 67 y.o. White or Caucasian [1] male with  has a past medical history of A-fib [facility]), Anxiety state (04/20/2007), DVT (deep venous thrombosis) (HCC), Dysphagia (09/13/2016), Erectile dysfunction (05/28/2011), Eustachian tube dysfunction, right (06/25/2017), GERD (gastroesophageal reflux disease), Hypertension, Long term current use of anticoagulant (07/03/2010), Pulmonary embolism (HCC), Radiculitis of right cervical region (09/13/2016), Right knee pain (06/25/2017), Shingles (02/06/2012), and Symptomatic PVCs.  Encounter for well adult exam with abnormal findings Age and sex appropriate education and counseling updated with regular exercise and diet Referrals for preventative services - declines colonoscopy as is wary of holding coumadin Immunizations addressed - declines all including tdap, shingrix, pneumovax Smoking counseling  - none needed Evidence for depression or other mood disorder - none significant Most recent labs reviewed. I have personally reviewed and have noted: 1) the patient's medical and social history 2) The patient's current medications and supplements 3) The patient's height, weight, and BMI have been recorded in the chart   Allergic rhinitis Mild, for add otc nasacort asd  B12 deficiency Lab Results  Component Value Date   VITAMINB12 274 09/09/2022   Low, to start oral replacement - b12 1000 mcg qd   GERD Mild to mod, for restart omeprazole 40 mg qd,,  to f/u any worsening symptoms or concerns   HLD (hyperlipidemia) Lab Results  Component Value Date   LDLCALC 43 09/09/2022   Stable, pt to continue current statin crestor 20 mg qd   Hyperglycemia Lab Results  Component Value Date   HGBA1C 5.9 09/09/2022   Stable, pt to continue current medical treatment  - diet, wt  control   Hypertension, uncontrolled BP Readings from Last 3 Encounters:  09/16/22 124/82  03/13/22 (!) 160/102  10/09/21 (!) 197/114   Stable, pt to continue medical treatment norvasc 10 mg qd, losartan 50 mg qd, toprol xl 50 mg qd   Vitamin D deficiency Last vitamin D Lab Results  Component Value Date   VD25OH 33.09 09/09/2022   Low, to increase oral replacement to 4000 u qd  Dysphagia Mild recurrence, declines GI referral for possible egd, for trial PPI as above  Followup: Return in about 6 months (around 03/18/2023).  Oliver Barre, MD 09/17/2022 8:25 AM [facility]

## 2022-09-17 ENCOUNTER — Encounter: Payer: Self-pay | Admitting: Internal Medicine

## 2022-09-17 NOTE — Assessment & Plan Note (Signed)
Mild to mod, for restart omeprazole 40 mg qd,,  to f/u any worsening symptoms or concerns

## 2022-09-17 NOTE — Assessment & Plan Note (Signed)
Mild recurrence, declines GI referral for possible egd, for trial PPI as above

## 2022-09-17 NOTE — Assessment & Plan Note (Addendum)
Last vitamin D Lab Results  Component Value Date   VD25OH 33.09 09/09/2022   Low, to increase oral replacement to 4000 u qd

## 2022-09-17 NOTE — Assessment & Plan Note (Signed)
Age and sex appropriate education and counseling updated with regular exercise and diet Referrals for preventative services - declines colonoscopy as is wary of holding coumadin Immunizations addressed - declines all including tdap, shingrix, pneumovax Smoking counseling  - none needed Evidence for depression or other mood disorder - none significant Most recent labs reviewed. I have personally reviewed and have noted: 1) the patient's medical and social history 2) The patient's current medications and supplements 3) The patient's height, weight, and BMI have been recorded in the chart

## 2022-09-17 NOTE — Assessment & Plan Note (Signed)
Mild, for add otc nasacort asd

## 2022-09-17 NOTE — Assessment & Plan Note (Signed)
BP Readings from Last 3 Encounters:  09/16/22 124/82  03/13/22 (!) 160/102  10/09/21 (!) 197/114   Stable, pt to continue medical treatment norvasc 10 mg qd, losartan 50 mg qd, toprol xl 50 mg qd

## 2022-09-17 NOTE — Assessment & Plan Note (Signed)
Lab Results  Component Value Date   VITAMINB12 274 09/09/2022   Low, to start oral replacement - b12 1000 mcg qd

## 2022-09-17 NOTE — Assessment & Plan Note (Signed)
Lab Results  Component Value Date   HGBA1C 5.9 09/09/2022   Stable, pt to continue current medical treatment  - diet, wt control  

## 2022-09-17 NOTE — Assessment & Plan Note (Signed)
Lab Results  Component Value Date   LDLCALC 43 09/09/2022   Stable, pt to continue current statin crestor 20 mg qd

## 2022-09-24 ENCOUNTER — Ambulatory Visit (INDEPENDENT_AMBULATORY_CARE_PROVIDER_SITE_OTHER): Payer: Medicare Other

## 2022-09-24 DIAGNOSIS — Z7901 Long term (current) use of anticoagulants: Secondary | ICD-10-CM | POA: Diagnosis not present

## 2022-09-24 LAB — POCT INR: INR: 2.4 (ref 2.0–3.0)

## 2022-09-24 NOTE — Progress Notes (Signed)
Continue 2 tablets daily except take 1 tablet on Mondays, Wednesdays and Fridays. Recheck in 6 weeks.  

## 2022-09-24 NOTE — Patient Instructions (Addendum)
Pre visit review using our clinic review tool, if applicable. No additional management support is needed unless otherwise documented below in the visit note.  Continue 2 tablets daily except take 1 tablet on Mondays, Wednesdays and Fridays. Recheck in 6 weeks.  

## 2022-10-28 DIAGNOSIS — J209 Acute bronchitis, unspecified: Secondary | ICD-10-CM | POA: Diagnosis not present

## 2022-10-28 DIAGNOSIS — R051 Acute cough: Secondary | ICD-10-CM | POA: Diagnosis not present

## 2022-10-28 DIAGNOSIS — R070 Pain in throat: Secondary | ICD-10-CM | POA: Diagnosis not present

## 2022-10-28 DIAGNOSIS — R0981 Nasal congestion: Secondary | ICD-10-CM | POA: Diagnosis not present

## 2022-11-05 ENCOUNTER — Ambulatory Visit (INDEPENDENT_AMBULATORY_CARE_PROVIDER_SITE_OTHER): Payer: Medicare Other

## 2022-11-05 DIAGNOSIS — Z7901 Long term (current) use of anticoagulants: Secondary | ICD-10-CM | POA: Diagnosis not present

## 2022-11-05 LAB — POCT INR: INR: 1.9 — AB (ref 2.0–3.0)

## 2022-11-05 NOTE — Patient Instructions (Addendum)
Pre visit review using our clinic review tool, if applicable. No additional management support is needed unless otherwise documented below in the visit note.  Increase dose today to take 3 tablets and the continue 2 tablets daily except take 1 tablet on Mondays, Wednesdays and Fridays. Recheck in 4 weeks.

## 2022-11-05 NOTE — Progress Notes (Addendum)
Pt reports a z-pack finished 1 week ago for sinus infection. Also took Delsym, OTC. Pt denies any other changes and does not know why he is subtherapeutic today. Increase dose today to take 3 tablets and the continue 2 tablets daily except take 1 tablet on Mondays, Wednesdays and Fridays. Recheck in 4 weeks.

## 2022-11-18 DIAGNOSIS — H52223 Regular astigmatism, bilateral: Secondary | ICD-10-CM | POA: Diagnosis not present

## 2022-11-18 DIAGNOSIS — H53143 Visual discomfort, bilateral: Secondary | ICD-10-CM | POA: Diagnosis not present

## 2022-11-18 DIAGNOSIS — H524 Presbyopia: Secondary | ICD-10-CM | POA: Diagnosis not present

## 2022-11-19 ENCOUNTER — Encounter: Payer: Self-pay | Admitting: Internal Medicine

## 2022-11-19 ENCOUNTER — Other Ambulatory Visit: Payer: Self-pay | Admitting: Internal Medicine

## 2022-11-19 MED ORDER — METOPROLOL SUCCINATE ER 50 MG PO TB24
25.0000 mg | ORAL_TABLET | Freq: Every day | ORAL | 3 refills | Status: DC
Start: 2022-11-19 — End: 2023-09-16

## 2022-12-03 ENCOUNTER — Ambulatory Visit (INDEPENDENT_AMBULATORY_CARE_PROVIDER_SITE_OTHER): Payer: Medicare Other

## 2022-12-03 DIAGNOSIS — Z7901 Long term (current) use of anticoagulants: Secondary | ICD-10-CM | POA: Diagnosis not present

## 2022-12-03 LAB — POCT INR: INR: 2.6 (ref 2.0–3.0)

## 2022-12-03 NOTE — Patient Instructions (Signed)
Pre visit review using our clinic review tool, if applicable. No additional management support is needed unless otherwise documented below in the visit note.  Continue 2 tablets daily except take 1 tablet on Mondays, Wednesdays and Fridays. Recheck in 6 weeks.  

## 2022-12-03 NOTE — Progress Notes (Signed)
Continue 2 tablets daily except take 1 tablet on Mondays, Wednesdays and Fridays. Recheck in 6 weeks.  

## 2022-12-18 ENCOUNTER — Encounter (INDEPENDENT_AMBULATORY_CARE_PROVIDER_SITE_OTHER): Payer: Self-pay

## 2022-12-27 DIAGNOSIS — J069 Acute upper respiratory infection, unspecified: Secondary | ICD-10-CM | POA: Diagnosis not present

## 2022-12-27 DIAGNOSIS — R0981 Nasal congestion: Secondary | ICD-10-CM | POA: Diagnosis not present

## 2022-12-27 DIAGNOSIS — R070 Pain in throat: Secondary | ICD-10-CM | POA: Diagnosis not present

## 2022-12-27 DIAGNOSIS — R509 Fever, unspecified: Secondary | ICD-10-CM | POA: Diagnosis not present

## 2022-12-27 DIAGNOSIS — R051 Acute cough: Secondary | ICD-10-CM | POA: Diagnosis not present

## 2022-12-31 ENCOUNTER — Ambulatory Visit: Payer: Medicare Other | Admitting: Internal Medicine

## 2023-01-14 ENCOUNTER — Ambulatory Visit: Payer: Medicare Other

## 2023-01-14 DIAGNOSIS — Z7901 Long term (current) use of anticoagulants: Secondary | ICD-10-CM | POA: Diagnosis not present

## 2023-01-14 LAB — POCT INR: INR: 2.7 (ref 2.0–3.0)

## 2023-01-14 NOTE — Progress Notes (Signed)
Continue 2 tablets daily except take 1 tablet on Mondays, Wednesdays and Fridays. Recheck in 6 weeks.  

## 2023-01-14 NOTE — Patient Instructions (Addendum)
Pre visit review using our clinic review tool, if applicable. No additional management support is needed unless otherwise documented below in the visit note.  Continue 2 tablets daily except take 1 tablet on Mondays, Wednesdays and Fridays. Recheck in 6 weeks.  

## 2023-02-03 ENCOUNTER — Ambulatory Visit (INDEPENDENT_AMBULATORY_CARE_PROVIDER_SITE_OTHER): Payer: Medicare Other

## 2023-02-03 VITALS — BP 120/72 | HR 67 | Ht 75.0 in | Wt 303.8 lb

## 2023-02-03 DIAGNOSIS — Z0000 Encounter for general adult medical examination without abnormal findings: Secondary | ICD-10-CM | POA: Diagnosis not present

## 2023-02-03 NOTE — Patient Instructions (Signed)
Justin Woodard , Thank you for taking time to come for your Medicare Wellness Visit. I appreciate your ongoing commitment to your health goals. Please review the following plan we discussed and let me know if I can assist you in the future.   Referrals/Orders/Follow-Ups/Clinician Recommendations: You are due for Flu, Tetanus, and Pneumonia vaccines, which you can get these done at your local pharmacy.  Aim for 30 minutes of exercise or brisk walking, 6-8 glasses of water, and 5 servings of fruits and vegetables each day.   This is a list of the screening recommended for you and due dates:  Health Maintenance  Topic Date Due   DTaP/Tdap/Td vaccine (1 - Tdap) Never done   Zoster (Shingles) Vaccine (1 of 2) Never done   Pneumonia Vaccine (1 of 1 - PCV) Never done   Flu Shot  12/19/2022   Colon Cancer Screening  09/16/2023*   Medicare Annual Wellness Visit  02/03/2024   Hepatitis C Screening  Completed   HPV Vaccine  Aged Out   COVID-19 Vaccine  Discontinued  *Topic was postponed. The date shown is not the original due date.    Advanced directives: (Copy Requested) Please bring a copy of your health care power of attorney and living will to the office to be added to your chart at your convenience.  Next Medicare Annual Wellness Visit scheduled for next year: Yes

## 2023-02-03 NOTE — Progress Notes (Unsigned)
Subjective:   Justin Woodard is a 67 y.o. male who presents for Medicare Annual/Subsequent preventive examination.  Visit Complete: In person   Cardiac Risk Factors include: advanced age (>70men, >76 women);hypertension;male gender;Other (see comment), Risk factor comments: A-Fib, Aortic atherosclerosis     Objective:    Today's Vitals   02/03/23 1515 02/03/23 1516  BP: 120/72   Pulse: 67 67  SpO2: 98%   Weight: (!) 303 lb 12.8 oz (137.8 kg)   Height: 6\' 3"  (1.905 m)   PainSc:  5    Body mass index is 37.97 kg/m.     02/03/2023    3:22 PM 02/21/2022    1:49 PM 04/16/2015   10:36 AM 01/23/2015    9:44 AM 05/17/2014    3:06 AM  Advanced Directives  Does Patient Have a Medical Advance Directive? Yes No No No No  Type of Estate agent of [place];Living will      Copy of Healthcare Power of Attorney in Chart? No - copy requested      Would patient like information on creating a medical advance directive?  No - Patient declined No - patient declined information      Current Medications (verified) Outpatient Encounter Medications as of 02/03/2023  Medication Sig   amLODipine (NORVASC) 10 MG tablet TAKE 1 TABLET BY MOUTH EVERY DAY   Cholecalciferol (VITAMIN D3) 50 MCG (2000 UT) capsule Take 2 capsules (4,000 Units total) by mouth daily.   losartan (COZAAR) 50 MG tablet Take 1 tablet (50 mg total) by mouth daily.   metoprolol succinate (TOPROL-XL) 50 MG 24 hr tablet Take 0.5 tablets (25 mg total) by mouth daily. Take with or immediately following a meal.   omeprazole (PRILOSEC) 40 MG capsule Take 1 capsule 40 mg by mouth daily before breakfast.   rosuvastatin (CRESTOR) 20 MG tablet Take 1 tablet (20 mg total) by mouth daily.   triamcinolone (NASACORT) 55 MCG/ACT AERO nasal inhaler Place 2 sprays into the nose daily.   warfarin (COUMADIN) 4 MG tablet TAKE 2 TABLETS BY MOUTH DAILY EXCEPT TAKE 1 TABLET ON MONDAYS, WEDNESDAYS AND FRIDAYS OR AS DIRECTED BY  COAGULATION CLINIC   No facility-administered encounter medications on file as of 02/03/2023.    Allergies (verified) Oxycodone   History: Past Medical History:  Diagnosis Date   A-fib [facility])    post PE only   Anxiety state 04/20/2007   Qualifier: Diagnosis of  By: Jonny Ruiz MD, Len Blalock    DVT (deep venous thrombosis) (HCC)    2000   Dysphagia 09/13/2016   Erectile dysfunction 05/28/2011   Eustachian tube dysfunction, right 06/25/2017   GERD (gastroesophageal reflux disease)    Hypertension    Long term current use of anticoagulant 07/03/2010   Pulmonary embolism (HCC)    Radiculitis of right cervical region 09/13/2016   Right knee pain 06/25/2017   Shingles 02/06/2012   Symptomatic PVCs    Past Surgical History:  Procedure Laterality Date   BREAST SURGERY     TONSILLECTOMY     VEIN SURGERY     RLE saphenous vein stripping   Family History  Problem Relation Age of Onset   Heart attack Father    Diabetes Other        first degree relatives   Social History   Socioeconomic History   Marital status: Married    Spouse name: Chief Technology Officer   Number of children: 2   Years of education: Not on file   Highest  education level: Associate degree: occupational, Scientist, product/process development, or vocational program  Occupational History   Occupation: PARTS DEPT    Employer: UNEMPLOYED   Occupation: Retired  Tobacco Use   Smoking status: Former   Smokeless tobacco: Never  Building services engineer status: Never Used  Substance and Sexual Activity   Alcohol use: No    Alcohol/week: 0.0 standard drinks of alcohol   Drug use: No   Sexual activity: Yes  Other Topics Concern   Not on file  Social History Narrative   Former Smoker   Alcohol use-no   Married   2 sons   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, broadcasting/film/video         Social Determinants of Corporate investment banker Strain: Low Risk  (02/03/2023)   Overall Financial Resource Strain (CARDIA)    Difficulty of Paying Living Expenses: Not very hard  Food  Insecurity: No Food Insecurity (02/03/2023)   Hunger Vital Sign    Worried About Running Out of Food in the Last Year: Never true    Ran Out of Food in the Last Year: Never true  Transportation Needs: No Transportation Needs (02/03/2023)   PRAPARE - Administrator, Civil Service (Medical): No    Lack of Transportation (Non-Medical): No  Physical Activity: Inactive (02/03/2023)   Exercise Vital Sign    Days of Exercise per Week: 0 days    Minutes of Exercise per Session: 0 min  Stress: Stress Concern Present (02/03/2023)   [employer] of Occupational Health - Occupational Stress Questionnaire    Feeling of Stress : To some extent  Social Connections: Moderately Isolated (02/03/2023)   Social Connection and Isolation Panel [NHANES]    Frequency of Communication with Friends and Family: More than three times a week    Frequency of Social Gatherings with Friends and Family: Once a week    Attends Religious Services: Never    Database administrator or Organizations: No    Attends Engineer, structural: Never    Marital Status: Married    Tobacco Counseling Counseling given: Not Answered   Clinical Intake:  Pre-visit preparation completed: Yes  Pain : 0-10 Pain Score: 5  Pain Type: Acute pain Pain Location: Shoulder (Also feet feels like they are cold, joints of rt toes) Pain Orientation: Right Pain Onset: Yesterday (working in the back yard)     BMI - recorded: 37.97 Nutritional Status: BMI > 30  Obese Nutritional Risks: None Diabetes: No  How often do you need to have someone help you when you read instructions, pamphlets, or other written materials from your doctor or pharmacy?: 1 - Never  Interpreter Needed?: No  Information entered by :: Ihan Pat, RMA   Activities of Daily Living    02/03/2023    3:19 PM 02/21/2022    2:02 PM  In your present state of health, do you have any difficulty performing the following activities:  Hearing? 0 0   Vision? 0 0  Difficulty concentrating or making decisions? 0 0  Walking or climbing stairs? 1 0  Comment Both legs bother him.   Dressing or bathing? 0 0  Doing errands, shopping? 0 0  Preparing Food and eating ? N N  Using the Toilet? N N  In the past six months, have you accidently leaked urine? N N  Do you have problems with loss of bowel control? N N  Managing your Medications? N N  Managing  your Finances? N N  Housekeeping or managing your Housekeeping? N N    Patient Care Team: Corwin Levins, MD as PCP - Michel Santee, OD as Consulting Physician (Optometry)  Indicate any recent Medical Services you may have received from other than [facility] providers in the past year (date may be approximate).     Assessment:   This is a routine wellness examination for Jostyn.  Hearing/Vision screen Hearing Screening - Comments:: Denies hearing difficulties   Vision Screening - Comments:: Wears eyeglasses   Goals Addressed             This Visit's Progress    To maintain my health and get evaluated for my sleep and feet issues.   Not on track     Depression Screen    02/03/2023    3:26 PM 09/16/2022   11:03 AM 02/21/2022    1:52 PM 09/10/2021    1:15 PM 09/10/2021    1:00 PM 09/07/2020   10:54 AM 09/07/2020   10:22 AM  PHQ 2/9 Scores  PHQ - 2 Score 0 3 0 1 2 0 0  PHQ- 9 Score 3 6   7   0    Fall Risk    02/03/2023    3:22 PM 09/16/2022   11:03 AM 02/21/2022    1:50 PM 09/10/2021    1:15 PM 09/10/2021    1:00 PM  Fall Risk   Falls in the past year? 0 0 0 0 0  Number falls in past yr: 0 0 0 0 0  Injury with Fall? 0 0 0 0 0  Risk for fall due to : No Fall Risks No Fall Risks No Fall Risks    Follow up Falls prevention discussed;Falls evaluation completed Falls evaluation completed Falls prevention discussed      MEDICARE RISK AT HOME: Medicare Risk at Home Any stairs in or around the home?: Yes If so, are there any without handrails?: Yes Home free of loose throw  rugs in walkways, pet beds, electrical cords, etc?: Yes Adequate lighting in your home to reduce risk of falls?: Yes Life alert?: No Use of a cane, walker or w/c?: No Grab bars in the bathroom?: No Shower chair or bench in shower?: No Elevated toilet seat or a handicapped toilet?: No  TIMED UP AND GO:  Was the test performed?  Yes  Length of time to ambulate 10 feet: 15 sec Gait slow and steady without use of assistive device    Cognitive Function:        02/03/2023    3:23 PM 02/21/2022    2:03 PM  6CIT Screen  What Year? 0 points 0 points  What month? 0 points 0 points  What time? 0 points 0 points  Count back from 20 0 points 0 points  Months in reverse 0 points 0 points  Repeat phrase 0 points 0 points  Total Score 0 points 0 points    Immunizations Immunization History  Administered Date(s) Administered   Fluad Quad(high Dose 65+) 03/07/2020, 02/02/2021, 03/13/2022   Influenza,inj,Quad PF,6+ Mos 06/25/2017, 06/05/2018   PFIZER(Purple Top)SARS-COV-2 Vaccination 07/26/2019, 08/16/2019    TDAP status: Due, Education has been provided regarding the importance of this vaccine. Advised may receive this vaccine at local pharmacy or Health Dept. Aware to provide a copy of the vaccination record if obtained from local pharmacy or Health Dept. Verbalized acceptance and understanding.  Flu Vaccine status: Due, Education has been provided regarding the importance of this  vaccine. Advised may receive this vaccine at local pharmacy or Health Dept. Aware to provide a copy of the vaccination record if obtained from local pharmacy or Health Dept. Verbalized acceptance and understanding.  Pneumococcal vaccine status: Due, Education has been provided regarding the importance of this vaccine. Advised may receive this vaccine at local pharmacy or Health Dept. Aware to provide a copy of the vaccination record if obtained from local pharmacy or Health Dept. Verbalized acceptance and  understanding.  Covid-19 vaccine status: Information provided on how to obtain vaccines.   Qualifies for Shingles Vaccine? Yes   Zostavax completed No   Shingrix Completed?: No.    Education has been provided regarding the importance of this vaccine. Patient has been advised to call insurance company to determine out of pocket expense if they have not yet received this vaccine. Advised may also receive vaccine at local pharmacy or Health Dept. Verbalized acceptance and understanding.  Screening Tests Health Maintenance  Topic Date Due   DTaP/Tdap/Td (1 - Tdap) Never done   Zoster Vaccines- Shingrix (1 of 2) Never done   Pneumonia Vaccine 58+ Years old (1 of 1 - PCV) Never done   INFLUENZA VACCINE  12/19/2022   Colonoscopy  09/16/2023 (Originally 09/24/1998)   Medicare Annual Wellness (AWV)  02/03/2024   Hepatitis C Screening  Completed   HPV VACCINES  Aged Out   COVID-19 Vaccine  Discontinued    Health Maintenance  Health Maintenance Due  Topic Date Due   DTaP/Tdap/Td (1 - Tdap) Never done   Zoster Vaccines- Shingrix (1 of 2) Never done   Pneumonia Vaccine 70+ Years old (1 of 1 - PCV) Never done   INFLUENZA VACCINE  12/19/2022    Colorectal cancer screening: Type of screening: Cologuard. Completed 11/15/2021. Repeat every 3 years  Lung Cancer Screening: (Low Dose CT Chest recommended if Age 38-80 years, 20 pack-year currently smoking OR have quit w/in 15years.) does not qualify.   Lung Cancer Screening Referral: N/A  Additional Screening:  Hepatitis C Screening: does qualify; Completed 06/25/2017  Vision Screening: Recommended annual ophthalmology exams for early detection of glaucoma and other disorders of the eye. Is the patient up to date with their annual eye exam?  Yes  Who is the provider or what is the name of the office in which the patient attends annual eye exams? Dr. Hyacinth Meeker If pt is not established with a provider, would they like to be referred to a provider to  establish care? No .   Dental Screening: Recommended annual dental exams for proper oral hygiene   Community Resource Referral / Chronic Care Management: CRR required this visit?  No   CCM required this visit?  No     Plan:     I have personally reviewed and noted the following in the patient's chart:   Medical and social history Use of alcohol, tobacco or illicit drugs  Current medications and supplements including opioid prescriptions. Patient is not currently taking opioid prescriptions. Functional ability and status Nutritional status Physical activity Advanced directives List of other physicians Hospitalizations, surgeries, and ER visits in previous 12 months Vitals Screenings to include cognitive, depression, and falls Referrals and appointments  In addition, I have reviewed and discussed with patient certain preventive protocols, quality metrics, and best practice recommendations. A written personalized care plan for preventive services as well as general preventive health recommendations were provided to patient.     Bogdan Vivona L Kiam Bransfield, CMA   02/03/2023   After Visit Summary: (  MyChart) Due to this being a telephonic visit, the after visit summary with patients personalized plan was offered to patient via MyChart   Nurse Notes: Patient is due for Tdap, Flu and Pneumonia vaccines, he is aware that he can get these at his pharmacy.  Patient is also due for Shingrix vaccine, however he declines.  He had no other concerns to address today.

## 2023-02-09 ENCOUNTER — Other Ambulatory Visit: Payer: Self-pay | Admitting: Internal Medicine

## 2023-02-09 DIAGNOSIS — Z7901 Long term (current) use of anticoagulants: Secondary | ICD-10-CM

## 2023-02-10 NOTE — Telephone Encounter (Signed)
Please to call pt  - please request refill per coumadin clinic

## 2023-02-25 ENCOUNTER — Ambulatory Visit (INDEPENDENT_AMBULATORY_CARE_PROVIDER_SITE_OTHER): Payer: Medicare Other

## 2023-02-25 DIAGNOSIS — Z23 Encounter for immunization: Secondary | ICD-10-CM | POA: Diagnosis not present

## 2023-02-25 DIAGNOSIS — Z7901 Long term (current) use of anticoagulants: Secondary | ICD-10-CM

## 2023-02-25 LAB — POCT INR: INR: 2.7 (ref 2.0–3.0)

## 2023-02-25 NOTE — Patient Instructions (Addendum)
Pre visit review using our clinic review tool, if applicable. No additional management support is needed unless otherwise documented below in the visit note.  Continue 2 tablets daily except take 1 tablet on Mondays, Wednesdays and Fridays. Recheck in 6 weeks.  

## 2023-02-25 NOTE — Progress Notes (Signed)
Continue 2 tablets daily except take 1 tablet on Mondays, Wednesdays and Fridays. Recheck in 6 weeks.   Pt requested high dose flu vaccine. Administered vaccine. Pt tolerated well.

## 2023-03-05 ENCOUNTER — Other Ambulatory Visit: Payer: Self-pay | Admitting: Internal Medicine

## 2023-03-05 ENCOUNTER — Other Ambulatory Visit: Payer: Self-pay

## 2023-03-13 ENCOUNTER — Encounter: Payer: Self-pay | Admitting: Internal Medicine

## 2023-03-14 ENCOUNTER — Other Ambulatory Visit: Payer: Self-pay

## 2023-03-14 DIAGNOSIS — Z7901 Long term (current) use of anticoagulants: Secondary | ICD-10-CM

## 2023-03-14 MED ORDER — WARFARIN SODIUM 4 MG PO TABS: ORAL_TABLET | ORAL | 1 refills | Status: DC

## 2023-03-18 ENCOUNTER — Ambulatory Visit (INDEPENDENT_AMBULATORY_CARE_PROVIDER_SITE_OTHER): Payer: Medicare Other | Admitting: Internal Medicine

## 2023-03-18 DIAGNOSIS — E538 Deficiency of other specified B group vitamins: Secondary | ICD-10-CM

## 2023-03-18 DIAGNOSIS — E559 Vitamin D deficiency, unspecified: Secondary | ICD-10-CM | POA: Diagnosis not present

## 2023-03-18 DIAGNOSIS — E7800 Pure hypercholesterolemia, unspecified: Secondary | ICD-10-CM | POA: Diagnosis not present

## 2023-03-18 DIAGNOSIS — R739 Hyperglycemia, unspecified: Secondary | ICD-10-CM | POA: Diagnosis not present

## 2023-03-18 LAB — HEPATIC FUNCTION PANEL
ALT: 23 U/L (ref 0–53)
AST: 20 U/L (ref 0–37)
Albumin: 4 g/dL (ref 3.5–5.2)
Alkaline Phosphatase: 67 U/L (ref 39–117)
Bilirubin, Direct: 0.2 mg/dL (ref 0.0–0.3)
Total Bilirubin: 1.1 mg/dL (ref 0.2–1.2)
Total Protein: 6.9 g/dL (ref 6.0–8.3)

## 2023-03-18 LAB — LIPID PANEL
Cholesterol: 119 mg/dL (ref 0–200)
HDL: 39.8 mg/dL (ref >39.00)
LDL Cholesterol: 43 mg/dL (ref 0–99)
NonHDL: 79.22
Total CHOL/HDL Ratio: 3
Triglycerides: 180 mg/dL — ABNORMAL HIGH (ref 0.0–149.0)
VLDL: 36 mg/dL (ref 0.0–40.0)

## 2023-03-18 LAB — BASIC METABOLIC PANEL
BUN: 14 mg/dL (ref 6–23)
CO2: 29 meq/L (ref 19–32)
Calcium: 9.1 mg/dL (ref 8.4–10.5)
Chloride: 104 meq/L (ref 96–112)
Creatinine, Ser: 1.27 mg/dL (ref 0.40–1.50)
GFR: 57.66 mL/min — ABNORMAL LOW (ref >60.00)
Glucose, Bld: 100 mg/dL — ABNORMAL HIGH (ref 70–99)
Potassium: 4.4 meq/L (ref 3.5–5.1)
Sodium: 139 meq/L (ref 135–145)

## 2023-03-18 LAB — VITAMIN B12: Vitamin B-12: 428 pg/mL (ref 211–911)

## 2023-03-18 LAB — VITAMIN D 25 HYDROXY (VIT D DEFICIENCY, FRACTURES): VITD: 33.06 ng/mL (ref 30.00–100.00)

## 2023-03-18 LAB — HEMOGLOBIN A1C: Hgb A1c MFr Bld: 6 % (ref 4.6–6.5)

## 2023-03-18 NOTE — Progress Notes (Signed)
Patient ID: Justin Woodard, male   DOB: 01-09-1956, 67 y.o.   MRN: [id number]        Chief Complaint: follow up HTN, HLD and hyperglycemia, b12 and vit d deficiency       HPI:  Justin Woodard is a 67 y.o. male here overall doing ok,  Pt denies chest pain, increased sob or doe, wheezing, orthopnea, PND, increased LE swelling, palpitations, dizziness or syncope.  Pt denies polydipsia, polyuria, or new focal neuro s/s.    Pt denies fever, wt loss, night sweats, loss of appetite, or other constitutional symptoms  Now retired, cannot find much to do.   S/p covid infection aug 2024.   Wt Readings from Last 3 Encounters:  03/18/23 (!) 302 lb 6.4 oz (137.2 kg)  02/03/23 (!) 303 lb 12.8 oz (137.8 kg)  09/16/22 299 lb (135.6 kg)   BP Readings from Last 3 Encounters:  03/18/23 114/82  02/03/23 120/72  09/16/22 124/82         Past Medical History:  Diagnosis Date   A-fib [facility])    post PE only   Anxiety state 04/20/2007   Qualifier: Diagnosis of  By: Jonny Ruiz MD, Len Blalock    DVT (deep venous thrombosis) (HCC)    2000   Dysphagia 09/13/2016   Erectile dysfunction 05/28/2011   Eustachian tube dysfunction, right 06/25/2017   GERD (gastroesophageal reflux disease)    Hypertension    Long term current use of anticoagulant 07/03/2010   Pulmonary embolism (HCC)    Radiculitis of right cervical region 09/13/2016   Right knee pain 06/25/2017   Shingles 02/06/2012   Symptomatic PVCs    Past Surgical History:  Procedure Laterality Date   BREAST SURGERY     TONSILLECTOMY     VEIN SURGERY     RLE saphenous vein stripping    reports that he has quit smoking. He has never used smokeless tobacco. He reports that he does not drink alcohol and does not use drugs. family history includes Diabetes in an other family member; Heart attack in his father. Allergies  Allergen Reactions   Oxycodone Other (See Comments)    "Just made me feel bad"   Current Outpatient Medications on File Prior to Visit  Medication Sig  Dispense Refill   amLODipine (NORVASC) 10 MG tablet TAKE 1 TABLET BY MOUTH EVERY DAY 90 tablet 0   Cholecalciferol (VITAMIN D3) 50 MCG (2000 UT) capsule Take 2 capsules (4,000 Units total) by mouth daily. 99 capsule 99   losartan (COZAAR) 50 MG tablet TAKE 1 TABLET BY MOUTH EVERY DAY 90 tablet 3   metoprolol succinate (TOPROL-XL) 50 MG 24 hr tablet Take 0.5 tablets (25 mg total) by mouth daily. Take with or immediately following a meal. 45 tablet 3   omeprazole (PRILOSEC) 40 MG capsule Take 1 capsule 40 mg by mouth daily before breakfast. 90 capsule 3   rosuvastatin (CRESTOR) 20 MG tablet TAKE 1 TABLET BY MOUTH EVERY DAY 90 tablet 3   warfarin (COUMADIN) 4 MG tablet TAKE 2 TABLETS BY MOUTH DAILY EXCEPT TAKE 1 TABLET ON MONDAYS, WEDNESDAYS AND FRIDAYS OR AS DIRECTED BY COAGULATION CLINIC 150 tablet 1   triamcinolone (NASACORT) 55 MCG/ACT AERO nasal inhaler Place 2 sprays into the nose daily. (Patient not taking: Reported on 03/18/2023) 1 each 12   No current facility-administered medications on file prior to visit.        ROS:  All others reviewed and negative.  Objective  PE:  BP 114/82   Pulse 65   Temp (!) 97.5 F (36.4 C) (Oral)   Ht 6\' 3"  (1.905 m)   Wt (!) 302 lb 6.4 oz (137.2 kg)   SpO2 99%   BMI 37.80 kg/m                 Constitutional: Pt appears in NAD               HENT: Head: NCAT.                Right Ear: External ear normal.                 Left Ear: External ear normal.                Eyes: . Pupils are equal, round, and reactive to light. Conjunctivae and EOM are normal               Nose: without d/c or deformity               Neck: Neck supple. Gross normal ROM               Cardiovascular: Normal rate and regular rhythm.                 Pulmonary/Chest: Effort normal and breath sounds without rales or wheezing.                Abd:  Soft, NT, ND, + BS, no organomegaly               Neurological: Pt is alert. At baseline orientation, motor grossly intact                Skin: Skin is warm. No rashes, no other new lesions, LE edema - none               Psychiatric: Pt behavior is normal without agitation   Micro: none  Cardiac tracings I have personally interpreted today:  none  Pertinent Radiological findings (summarize): none   Lab Results  Component Value Date   WBC 5.8 09/09/2022   HGB 13.5 09/09/2022   HCT 39.3 09/09/2022   PLT 225.0 09/09/2022   GLUCOSE 85 09/09/2022   CHOL 115 09/09/2022   TRIG 180.0 (H) 09/09/2022   HDL 36.20 (L) 09/09/2022   LDLDIRECT 124.0 09/10/2021   LDLCALC 43 09/09/2022   ALT 27 09/09/2022   AST 23 09/09/2022   NA 141 09/09/2022   K 4.3 09/09/2022   CL 104 09/09/2022   CREATININE 1.23 09/09/2022   BUN 13 09/09/2022   CO2 30 09/09/2022   TSH 3.27 09/09/2022   PSA 2.37 09/09/2022   INR 2.7 02/25/2023   HGBA1C 5.9 09/09/2022   MICROALBUR <0.7 09/09/2022   Assessment/Plan:  ARVIL KMIECIK is a 67 y.o. White or Caucasian [1] male with  has a past medical history of A-fib [facility]), Anxiety state (04/20/2007), DVT (deep venous thrombosis) (HCC), Dysphagia (09/13/2016), Erectile dysfunction (05/28/2011), Eustachian tube dysfunction, right (06/25/2017), GERD (gastroesophageal reflux disease), Hypertension, Long term current use of anticoagulant (07/03/2010), Pulmonary embolism (HCC), Radiculitis of right cervical region (09/13/2016), Right knee pain (06/25/2017), Shingles (02/06/2012), and Symptomatic PVCs.  Vitamin D deficiency Last vitamin D Lab Results  Component Value Date   VD25OH 33.09 09/09/2022   Low, to start oral replacement   Hyperglycemia Lab Results  Component Value Date   HGBA1C 5.9 09/09/2022   Stable, pt to continue current  medical treatment  - diet, wt control   HLD (hyperlipidemia) Lab Results  Component Value Date   LDLCALC 43 09/09/2022   Stable, pt to continue current statin crestor 20 mg qd   B12 deficiency Lab Results  Component Value Date   VITAMINB12 274 09/09/2022   Low,  to start oral replacement - b12 1000 mcg qd  Followup: Return in about 6 months (around 09/16/2023).  Oliver Barre, MD 03/18/2023 1:16 PM [facility]

## 2023-03-18 NOTE — Patient Instructions (Signed)

## 2023-03-18 NOTE — Assessment & Plan Note (Signed)
Lab Results  Component Value Date   VITAMINB12 274 09/09/2022   Low, to start oral replacement - b12 1000 mcg qd

## 2023-03-18 NOTE — Assessment & Plan Note (Signed)
Last vitamin D Lab Results  Component Value Date   VD25OH 33.09 09/09/2022   Low, to start oral replacement

## 2023-03-18 NOTE — Progress Notes (Signed)
The test results show that your current treatment is OK, as the tests are stable.  Please continue the same plan.  There is no other need for change of treatment or further evaluation based on these results, at this time.  thanks 

## 2023-03-18 NOTE — Assessment & Plan Note (Signed)
Lab Results  Component Value Date   HGBA1C 5.9 09/09/2022   Stable, pt to continue current medical treatment  - diet, wt control  

## 2023-03-18 NOTE — Assessment & Plan Note (Signed)
Lab Results  Component Value Date   LDLCALC 43 09/09/2022   Stable, pt to continue current statin crestor 20 mg qd

## 2023-04-08 ENCOUNTER — Ambulatory Visit (INDEPENDENT_AMBULATORY_CARE_PROVIDER_SITE_OTHER): Payer: Medicare Other

## 2023-04-08 DIAGNOSIS — Z7901 Long term (current) use of anticoagulants: Secondary | ICD-10-CM

## 2023-04-08 LAB — POCT INR: INR: 2.6 (ref 2.0–3.0)

## 2023-04-08 NOTE — Progress Notes (Signed)
Continue 2 tablets daily except take 1 tablet on Mondays, Wednesdays and Fridays. Recheck in 7 weeks due to holiday.

## 2023-04-08 NOTE — Patient Instructions (Addendum)
Pre visit review using our clinic review tool, if applicable. No additional management support is needed unless otherwise documented below in the visit note.  Continue 2 tablets daily except take 1 tablet on Mondays, Wednesdays and Fridays. Recheck in 7 weeks

## 2023-04-16 ENCOUNTER — Encounter: Payer: Self-pay | Admitting: Internal Medicine

## 2023-04-16 ENCOUNTER — Ambulatory Visit: Payer: Medicare Other | Attending: Internal Medicine | Admitting: Internal Medicine

## 2023-04-16 VITALS — BP 132/74 | HR 67 | Ht 76.0 in | Wt 303.0 lb

## 2023-04-16 DIAGNOSIS — I10 Essential (primary) hypertension: Secondary | ICD-10-CM | POA: Diagnosis not present

## 2023-04-16 DIAGNOSIS — Z79899 Other long term (current) drug therapy: Secondary | ICD-10-CM

## 2023-04-16 MED ORDER — LOSARTAN POTASSIUM 100 MG PO TABS: 100.0000 mg | ORAL_TABLET | Freq: Every day | ORAL | 3 refills | Status: DC

## 2023-04-16 NOTE — Progress Notes (Signed)
HPI Justin Woodard returns today for ongoing evaluation and management of PAF. He is a pleasant 67 yo man with HTN, peripheral edema, h/o pulm embolism, and dyslipidemia. I saw him initially due to PAF. He has been stable in the past year. He has chronic peripheral edema. He has been on amlodipine.  Allergies  Allergen Reactions   Oxycodone Other (See Comments)    "Just made me feel bad"     Current Outpatient Medications  Medication Sig Dispense Refill   amLODipine (NORVASC) 10 MG tablet TAKE 1 TABLET BY MOUTH EVERY DAY 90 tablet 0   Cholecalciferol (VITAMIN D3) 50 MCG (2000 UT) capsule Take 2 capsules (4,000 Units total) by mouth daily. 99 capsule 99   losartan (COZAAR) 50 MG tablet TAKE 1 TABLET BY MOUTH EVERY DAY 90 tablet 3   metoprolol succinate (TOPROL-XL) 50 MG 24 hr tablet Take 0.5 tablets (25 mg total) by mouth daily. Take with or immediately following a meal. 45 tablet 3   rosuvastatin (CRESTOR) 20 MG tablet TAKE 1 TABLET BY MOUTH EVERY DAY 90 tablet 3   warfarin (COUMADIN) 4 MG tablet TAKE 2 TABLETS BY MOUTH DAILY EXCEPT TAKE 1 TABLET ON MONDAYS, WEDNESDAYS AND FRIDAYS OR AS DIRECTED BY COAGULATION CLINIC 150 tablet 1   omeprazole (PRILOSEC) 40 MG capsule Take 1 capsule 40 mg by mouth daily before breakfast. (Patient not taking: Reported on 04/16/2023) 90 capsule 3   triamcinolone (NASACORT) 55 MCG/ACT AERO nasal inhaler Place 2 sprays into the nose daily. (Patient not taking: Reported on 03/18/2023) 1 each 12   No current facility-administered medications for this visit.     Past Medical History:  Diagnosis Date   A-fib [facility])    post PE only   Anxiety state 04/20/2007   Qualifier: Diagnosis of  By: Jonny Ruiz MD, Len Blalock    DVT (deep venous thrombosis) (HCC)    2000   Dysphagia 09/13/2016   Erectile dysfunction 05/28/2011   Eustachian tube dysfunction, right 06/25/2017   GERD (gastroesophageal reflux disease)    Hypertension    Long term current use of anticoagulant  07/03/2010   Pulmonary embolism (HCC)    Radiculitis of right cervical region 09/13/2016   Right knee pain 06/25/2017   Shingles 02/06/2012   Symptomatic PVCs     ROS:   All systems reviewed and negative except as noted in the HPI.   Past Surgical History:  Procedure Laterality Date   BREAST SURGERY     TONSILLECTOMY     VEIN SURGERY     RLE saphenous vein stripping     Family History  Problem Relation Age of Onset   Heart attack Father    Diabetes Other        first degree relatives     Social History   Socioeconomic History   Marital status: Married    Spouse name: Chief Technology Officer   Number of children: 2   Years of education: Not on file   Highest education level: Associate degree: occupational, Scientist, product/process development, or vocational program  Occupational History   Occupation: PARTS DEPT    Employer: UNEMPLOYED   Occupation: Retired  Tobacco Use   Smoking status: Former   Smokeless tobacco: Never  Advertising account planner   Vaping status: Never Used  Substance and Sexual Activity   Alcohol use: No    Alcohol/week: 0.0 standard drinks of alcohol   Drug use: No   Sexual activity: Yes  Other Topics Concern   Not on file  Social History Narrative   Former Smoker   Alcohol use-no   Married   2 sons   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, broadcasting/film/video         Social Determinants of Corporate investment banker Strain: Low Risk  (03/18/2023)   Overall Financial Resource Strain (CARDIA)    Difficulty of Paying Living Expenses: Not very hard  Food Insecurity: No Food Insecurity (03/18/2023)   Hunger Vital Sign    Worried About Running Out of Food in the Last Year: Never true    Ran Out of Food in the Last Year: Never true  Transportation Needs: No Transportation Needs (03/18/2023)   PRAPARE - Administrator, Civil Service (Medical): No    Lack of Transportation (Non-Medical): No  Physical Activity: Insufficiently Active (03/18/2023)   Exercise Vital Sign    Days of Exercise per Week:  3 days    Minutes of Exercise per Session: 10 min  Stress: Stress Concern Present (03/18/2023)   [employer] of Occupational Health - Occupational Stress Questionnaire    Feeling of Stress : To some extent  Social Connections: Moderately Isolated (03/18/2023)   Social Connection and Isolation Panel [NHANES]    Frequency of Communication with Friends and Family: Twice a week    Frequency of Social Gatherings with Friends and Family: Once a week    Attends Religious Services: Never    Database administrator or Organizations: No    Attends Banker Meetings: Never    Marital Status: Married  Catering manager Violence: Not At Risk (02/03/2023)   Humiliation, Afraid, Rape, and Kick questionnaire    Fear of Current or Ex-Partner: No    Emotionally Abused: No    Physically Abused: No    Sexually Abused: No     BP 132/74   Pulse 67   Ht 6\' 4"  (1.93 m)   Wt (!) 303 lb (137.4 kg)   SpO2 95%   BMI 36.88 kg/m   Physical Exam:  Well appearing NAD HEENT: Unremarkable Neck:  No JVD, no thyromegally Lymphatics:  No adenopathy Back:  No CVA tenderness Lungs:  Clear with no wheezes HEART:  Regular rate rhythm, no murmurs, no rubs, no clicks Abd:  soft, positive bowel sounds, no organomegally, no rebound, no guarding Ext:  2 plus pulses, no edema, no cyanosis, no clubbing Skin:  No rashes no nodules Neuro:  CN II through XII intact, motor grossly intact   Assess/Plan: PAF - he is maintaining NSR very nicely and will continue his beta blocker and systemic anti-coag. Remote pulmonary embolism - continue warfarin HTN - his bp is up a little. I asked him to stop amlodipine and uptitrate the losartan to 100 mg daily. He will get a bmp in 2 weeks. Peripheral edema - likely multifactorial. He will stop amlodipine. Will hold off on a diuretic for now.  Sharlot Gowda Hollynn Garno,MD

## 2023-04-16 NOTE — Patient Instructions (Addendum)
Medication Instructions:  Your physician has recommended you make the following change in your medication:  Stop amlodipine. Increase losartan to 100mg  daily.  Lab Work: BMP in 2 weeks.  If you have labs (blood work) drawn today and your tests are completely normal, you will receive your results only by: MyChart Message (if you have MyChart) OR A paper copy in the mail If you have any lab test that is abnormal or we need to change your treatment, we will call you to review the results.  Testing/Procedures: None ordered.  Follow-Up: At [facility], you and your health needs are our priority.  As part of our continuing mission to provide you with exceptional heart care, we have created designated Provider Care Teams.  These Care Teams include your primary Cardiologist (physician) and Advanced Practice Providers (APPs -  Physician Assistants and Nurse Practitioners) who all work together to provide you with the care you need, when you need it.   Your next appointment:   1 year(s)  The format for your next appointment:   In Person  Provider:   Lewayne Bunting, MD{or one of the following Advanced Practice Providers on your designated Care Team:   Francis Dowse, [place] Casimiro Needle "Mardelle Matte" [place], [place] Earnest Rosier, NP    Important Information About Sugar

## 2023-04-24 ENCOUNTER — Emergency Department (HOSPITAL_COMMUNITY)
Admission: EM | Admit: 2023-04-24 | Discharge: 2023-04-24 | Disposition: A | Discharge status: Home / Self Care | Payer: Medicare Other | Attending: Emergency Medicine | Admitting: Emergency Medicine

## 2023-04-24 ENCOUNTER — Emergency Department (HOSPITAL_COMMUNITY): Payer: Medicare Other

## 2023-04-24 ENCOUNTER — Other Ambulatory Visit: Payer: Self-pay

## 2023-04-24 DIAGNOSIS — J449 Chronic obstructive pulmonary disease, unspecified: Secondary | ICD-10-CM | POA: Insufficient documentation

## 2023-04-24 DIAGNOSIS — Z79899 Other long term (current) drug therapy: Secondary | ICD-10-CM | POA: Diagnosis not present

## 2023-04-24 DIAGNOSIS — R0602 Shortness of breath: Secondary | ICD-10-CM | POA: Insufficient documentation

## 2023-04-24 DIAGNOSIS — Z7901 Long term (current) use of anticoagulants: Secondary | ICD-10-CM | POA: Diagnosis not present

## 2023-04-24 DIAGNOSIS — R0789 Other chest pain: Secondary | ICD-10-CM | POA: Diagnosis not present

## 2023-04-24 DIAGNOSIS — Z86711 Personal history of pulmonary embolism: Secondary | ICD-10-CM | POA: Diagnosis not present

## 2023-04-24 DIAGNOSIS — I10 Essential (primary) hypertension: Secondary | ICD-10-CM | POA: Insufficient documentation

## 2023-04-24 DIAGNOSIS — R079 Chest pain, unspecified: Secondary | ICD-10-CM | POA: Diagnosis present

## 2023-04-24 LAB — TROPONIN I (HIGH SENSITIVITY): Troponin I (High Sensitivity): 6 ng/L (ref <18)

## 2023-04-24 LAB — BASIC METABOLIC PANEL
Anion gap: 12 (ref 5–15)
BUN: 14 mg/dL (ref 8–23)
CO2: 24 mmol/L (ref 22–32)
Calcium: 8.9 mg/dL (ref 8.9–10.3)
Chloride: 101 mmol/L (ref 98–111)
Creatinine, Ser: 1.52 mg/dL — ABNORMAL HIGH (ref 0.61–1.24)
GFR, Estimated: 49 mL/min — ABNORMAL LOW (ref >60)
Glucose, Bld: 120 mg/dL — ABNORMAL HIGH (ref 70–99)
Potassium: 3.8 mmol/L (ref 3.5–5.1)
Sodium: 137 mmol/L (ref 135–145)

## 2023-04-24 LAB — CBC
HCT: 40.3 % (ref 39.0–52.0)
Hemoglobin: 13 g/dL (ref 13.0–17.0)
MCH: 27.8 pg (ref 26.0–34.0)
MCHC: 32.3 g/dL (ref 30.0–36.0)
MCV: 86.1 fL (ref 80.0–100.0)
Platelets: 202 10*3/uL (ref 150–400)
RBC: 4.68 MIL/uL (ref 4.22–5.81)
RDW: 12.7 % (ref 11.5–15.5)
WBC: 5.2 10*3/uL (ref 4.0–10.5)
nRBC: 0 % (ref 0.0–0.2)

## 2023-04-24 LAB — APTT: aPTT: 33 s (ref 24–36)

## 2023-04-24 LAB — TYPE AND SCREEN
ABO/RH(D): A POS
Antibody Screen: NEGATIVE

## 2023-04-24 LAB — PROTIME-INR
INR: 2 — ABNORMAL HIGH (ref 0.8–1.2)
Prothrombin Time: 23 s — ABNORMAL HIGH (ref 11.4–15.2)

## 2023-04-24 NOTE — ED Triage Notes (Signed)
Pt. Stated, my breathing was off last night and my chest is just not right . I did have some blood when I went to the bathroom . I went to Dr. Maurice Small week and added a couple. I do take Warfarin for a blood clot years ago 24 years.

## 2023-04-24 NOTE — Discharge Instructions (Signed)
Your evaluation today has been largely reassuring.  But, it is important that you monitor your condition carefully, and do not hesitate to return to the ED if you develop new, or concerning changes in your condition. ° °Otherwise, please follow-up with your physician for appropriate ongoing care. ° °

## 2023-04-24 NOTE — ED Provider Notes (Signed)
[place] EMERGENCY DEPARTMENT AT [facility] Provider Note   CSN: [id number] Arrival date & time: 04/24/23  0715     History  Chief Complaint  Patient presents with   Chest Pain   Shortness of Breath   Rectal Bleeding    Justin Woodard is a 67 y.o. male.  HPI Presents with chest pain and shortness of breath.  Notes a history of COPD, smoking in the distant past.  He is on Coumadin due to history of PE in the distant past.  Last night he noticed his breathing was more difficult than usual, and he had some chest discomfort.  Currently he has no complaints.  He notes no changes in medication, diet, activity recently, no focal weakness, no syncope, no fever.    Home Medications Prior to Admission medications   Medication Sig Start Date End Date Taking? Authorizing Provider  Cholecalciferol (VITAMIN D3) 50 MCG (2000 UT) capsule Take 2 capsules (4,000 Units total) by mouth daily. 03/13/22   Corwin Levins, MD  losartan (COZAAR) 100 MG tablet Take 1 tablet (100 mg total) by mouth daily. 04/16/23 07/15/23  Marinus Maw, MD  metoprolol succinate (TOPROL-XL) 50 MG 24 hr tablet Take 0.5 tablets (25 mg total) by mouth daily. Take with or immediately following a meal. 11/19/22   Corwin Levins, MD  omeprazole (PRILOSEC) 40 MG capsule Take 1 capsule 40 mg by mouth daily before breakfast. Patient not taking: Reported on 04/16/2023 09/27/21   Corwin Levins, MD  rosuvastatin (CRESTOR) 20 MG tablet TAKE 1 TABLET BY MOUTH EVERY DAY 03/05/23   Corwin Levins, MD  triamcinolone (NASACORT) 55 MCG/ACT AERO nasal inhaler Place 2 sprays into the nose daily. Patient not taking: Reported on 03/18/2023 09/07/20   Corwin Levins, MD  warfarin (COUMADIN) 4 MG tablet TAKE 2 TABLETS BY MOUTH DAILY EXCEPT TAKE 1 TABLET ON MONDAYS, WEDNESDAYS AND FRIDAYS OR AS DIRECTED BY COAGULATION CLINIC 03/14/23   Corwin Levins, MD      Allergies    Oxycodone    Review of Systems   Review of Systems  Physical  Exam Updated Vital Signs BP (!) 147/87 (BP Location: Right Arm)   Pulse 68   Temp 98.1 F (36.7 C) (Oral)   Resp 16   Ht 6\' 4"  (1.93 m)   Wt (!) 137.4 kg   SpO2 98%   BMI 36.88 kg/m  Physical Exam Vitals and nursing note reviewed.  Constitutional:      General: He is not in acute distress.    Appearance: He is well-developed.  HENT:     Head: Normocephalic and atraumatic.  Eyes:     Conjunctiva/sclera: Conjunctivae normal.  Cardiovascular:     Rate and Rhythm: Normal rate and regular rhythm.  Pulmonary:     Effort: Pulmonary effort is normal. No respiratory distress.     Breath sounds: No stridor.  Abdominal:     General: There is no distension.  Skin:    General: Skin is warm and dry.  Neurological:     Mental Status: He is alert and oriented to person, place, and time.     ED Results / Procedures / Treatments   Labs (all labs ordered are listed, but only abnormal results are displayed) Labs Reviewed  BASIC METABOLIC PANEL - Abnormal; Notable for the following components:      Result Value   Glucose, Bld 120 (*)    Creatinine, Ser 1.52 (*)  GFR, Estimated 49 (*)    All other components within normal limits  PROTIME-INR - Abnormal; Notable for the following components:   Prothrombin Time 23.0 (*)    INR 2.0 (*)    All other components within normal limits  CBC  APTT  POC OCCULT BLOOD, ED  TYPE AND SCREEN  ABO/RH  TROPONIN I (HIGH SENSITIVITY)    EKG EKG Interpretation Date/Time:  Thursday April 24 2023 07:24:30 EST Ventricular Rate:  80 PR Interval:  280 QRS Duration:  106 QT Interval:  404 QTC Calculation: 465 R Axis:   45  Text Interpretation: Sinus rhythm with 1st degree A-V block with frequent Premature ventricular complexes Otherwise normal ECG Confirmed by Gerhard Munch [phone]) on 04/24/2023 11:11:11 AM  Radiology DG Chest 2 View  Result Date: 04/24/2023 CLINICAL DATA:  Shortness of breath. EXAM: CHEST - 2 VIEW COMPARISON:  March 12, 2021. FINDINGS: The heart size and mediastinal contours are within normal limits. Both lungs are clear. The visualized skeletal structures are unremarkable. IMPRESSION: No active cardiopulmonary disease. Electronically Signed   By: Lupita Raider M.D.   On: 04/24/2023 08:16    Procedures Procedures    Medications Ordered in ED Medications - No data to display  ED Course/ Medical Decision Making/ A&P                                 Medical Decision Making Patient multiple medical issues including PE, COPD, hypertension, A-fib presents with chest pain, dyspnea.  Patient's history, increased risk profile makes broad differential including PE, ACS, COPD exacerbation, infectious etiology.  Patient's absence of focal neurodeficits or complaints is reassuring, little evidence for dissection, CNS pathology. Cardiac 70 sinus normal Pulse ox 97% room air normal   Amount and/or Complexity of Data Reviewed External Data Reviewed: notes. Labs: ordered. Decision-making details documented in ED Course. Radiology: ordered and independent interpretation performed. Decision-making details documented in ED Course. ECG/medicine tests: ordered and independent interpretation performed. Decision-making details documented in ED Course.  Risk Prescription drug management. Decision regarding hospitalization. Diagnosis or treatment significantly limited by social determinants of health.   Patient in no distress, awake, alert, has ambulated with a pulse ox, without substantial decline, troponin value which was delayed is unremarkable, no evidence for ACS, INR appropriate for the patient, no evidence for pneumonia, possible mild COPD exacerbation, without increased work of breathing, hypoxia, or abnormal x-ray, no indication for additional therapy.  Patient appropriate for discharge to which he is amenable to follow-up with primary care.        Final Clinical Impression(s) / ED Diagnoses Final  diagnoses:  Atypical chest pain    Rx / DC Orders ED Discharge Orders     None         Gerhard Munch, MD 04/24/23 1547

## 2023-04-24 NOTE — ED Notes (Signed)
Phlebotomy to obtain troponin.

## 2023-04-24 NOTE — ED Notes (Signed)
Awaiting patient from triage

## 2023-04-24 NOTE — ED Notes (Signed)
Pt spo2 94%

## 2023-04-24 NOTE — ED Notes (Signed)
Transported to xray 

## 2023-04-25 ENCOUNTER — Ambulatory Visit (INDEPENDENT_AMBULATORY_CARE_PROVIDER_SITE_OTHER): Payer: Medicare Other

## 2023-04-25 DIAGNOSIS — Z7901 Long term (current) use of anticoagulants: Secondary | ICD-10-CM

## 2023-04-25 LAB — POCT INR: INR: 2.2 (ref 2.0–3.0)

## 2023-04-25 NOTE — Progress Notes (Signed)
Pt was in ER yesterday for chest pain. No source was found and pt is feeling well now. Pt contacted the coumadin clinic this morning and requested INR check. INR performed in ER was 2.0 and pt is normally in the middle of his range (2.0-3.0) and not at the bottom. Pt reports cardiology stopped amlodipine and added losartan.  Continue 2 tablets daily except take 1 tablet on Mondays, Wednesdays and Fridays. Recheck in 2 weeks per pt request.

## 2023-04-25 NOTE — Patient Instructions (Addendum)
Pre visit review using our clinic review tool, if applicable. No additional management support is needed unless otherwise documented below in the visit note.  Continue 2 tablets daily except take 1 tablet on Mondays, Wednesdays and Fridays. Recheck in 2 weeks.

## 2023-05-01 ENCOUNTER — Encounter: Payer: Self-pay | Admitting: Internal Medicine

## 2023-05-02 ENCOUNTER — Other Ambulatory Visit: Payer: Self-pay

## 2023-05-02 DIAGNOSIS — Z79899 Other long term (current) drug therapy: Secondary | ICD-10-CM | POA: Diagnosis not present

## 2023-05-03 LAB — BASIC METABOLIC PANEL
BUN/Creatinine Ratio: 9 — ABNORMAL LOW (ref 10–24)
BUN: 12 mg/dL (ref 8–27)
CO2: 23 mmol/L (ref 20–29)
Calcium: 9.1 mg/dL (ref 8.6–10.2)
Chloride: 104 mmol/L (ref 96–106)
Creatinine, Ser: 1.27 mg/dL (ref 0.76–1.27)
Glucose: 92 mg/dL (ref 70–99)
Potassium: 4.5 mmol/L (ref 3.5–5.2)
Sodium: 141 mmol/L (ref 134–144)
eGFR: 61 mL/min/{1.73_m2} (ref >59)

## 2023-05-09 ENCOUNTER — Ambulatory Visit (INDEPENDENT_AMBULATORY_CARE_PROVIDER_SITE_OTHER): Payer: Medicare Other

## 2023-05-09 DIAGNOSIS — Z7901 Long term (current) use of anticoagulants: Secondary | ICD-10-CM

## 2023-05-09 LAB — POCT INR: INR: 2.8 (ref 2.0–3.0)

## 2023-05-09 NOTE — Progress Notes (Signed)
Continue 2 tablets daily except take 1 tablet on Mondays, Wednesdays and Fridays. Recheck in 4 weeks.

## 2023-05-09 NOTE — Patient Instructions (Addendum)
Pre visit review using our clinic review tool, if applicable. No additional management support is needed unless otherwise documented below in the visit note.  Continue 2 tablets daily except take 1 tablet on Mondays, Wednesdays and Fridays. Recheck in 4 weeks. 

## 2023-05-11 ENCOUNTER — Other Ambulatory Visit: Payer: Self-pay | Admitting: Internal Medicine

## 2023-05-27 ENCOUNTER — Ambulatory Visit: Payer: Medicare Other

## 2023-06-06 ENCOUNTER — Ambulatory Visit (INDEPENDENT_AMBULATORY_CARE_PROVIDER_SITE_OTHER): Payer: Medicare Other

## 2023-06-06 DIAGNOSIS — Z7901 Long term (current) use of anticoagulants: Secondary | ICD-10-CM

## 2023-06-06 LAB — POCT INR: INR: 2.3 (ref 2.0–3.0)

## 2023-06-06 MED ORDER — WARFARIN SODIUM 4 MG PO TABS: ORAL_TABLET | ORAL | 1 refills | Status: DC

## 2023-06-06 NOTE — Progress Notes (Signed)
Continue 2 tablets daily except take 1 tablet on Mondays, Wednesdays and Fridays. Recheck in 4 weeks.   Pt is compliant with warfarin management and PCP apts.  Sent in refill of warfarin to requested pharmacy.

## 2023-06-06 NOTE — Patient Instructions (Addendum)
Pre visit review using our clinic review tool, if applicable. No additional management support is needed unless otherwise documented below in the visit note.  Continue 2 tablets daily except take 1 tablet on Mondays, Wednesdays and Fridays. Recheck in 4 weeks. 

## 2023-06-30 ENCOUNTER — Encounter: Payer: Self-pay | Admitting: Internal Medicine

## 2023-07-04 ENCOUNTER — Ambulatory Visit: Payer: Medicare Other

## 2023-07-04 ENCOUNTER — Encounter: Payer: Self-pay | Admitting: Internal Medicine

## 2023-07-04 DIAGNOSIS — Z7901 Long term (current) use of anticoagulants: Secondary | ICD-10-CM | POA: Diagnosis not present

## 2023-07-04 LAB — POCT INR: INR: 2.1 (ref 2.0–3.0)

## 2023-07-04 MED ORDER — AZITHROMYCIN 250 MG PO TABS: ORAL_TABLET | ORAL | 1 refills | Status: AC

## 2023-07-04 NOTE — Progress Notes (Signed)
Continue 2 tablets daily except take 1 tablet on Mondays, Wednesdays and Fridays. Recheck in 6 weeks.

## 2023-07-04 NOTE — Patient Instructions (Addendum)
Pre visit review using our clinic review tool, if applicable. No additional management support is needed unless otherwise documented below in the visit note.  Continue 2 tablets daily except take 1 tablet on Mondays, Wednesdays and Fridays. Recheck in 6 weeks.

## 2023-08-12 ENCOUNTER — Ambulatory Visit (INDEPENDENT_AMBULATORY_CARE_PROVIDER_SITE_OTHER): Payer: Medicare Other

## 2023-08-12 DIAGNOSIS — Z7901 Long term (current) use of anticoagulants: Secondary | ICD-10-CM | POA: Diagnosis not present

## 2023-08-12 LAB — POCT INR: INR: 2.5 (ref 2.0–3.0)

## 2023-08-12 NOTE — Patient Instructions (Addendum)
 Pre visit review using our clinic review tool, if applicable. No additional management support is needed unless otherwise documented below in the visit note.  Continue 2 tablets daily except take 1 tablet on Mondays, Wednesdays and Fridays. Recheck in 5 weeks.

## 2023-08-12 NOTE — Progress Notes (Addendum)
 Pt reports he took some "left over" abx, z-pack, about h 2 weeks ago.  Continue 2 tablets daily except take 1 tablet on Mondays, Wednesdays and Fridays. Recheck in 5 weeks.

## 2023-08-15 ENCOUNTER — Ambulatory Visit: Payer: Medicare Other

## 2023-09-08 ENCOUNTER — Encounter: Payer: Self-pay | Admitting: Internal Medicine

## 2023-09-08 DIAGNOSIS — T189XXA Foreign body of alimentary tract, part unspecified, initial encounter: Secondary | ICD-10-CM | POA: Diagnosis not present

## 2023-09-08 DIAGNOSIS — T18128A Food in esophagus causing other injury, initial encounter: Secondary | ICD-10-CM | POA: Diagnosis not present

## 2023-09-08 DIAGNOSIS — W44F3XA Food entering into or through a natural orifice, initial encounter: Secondary | ICD-10-CM | POA: Diagnosis not present

## 2023-09-16 ENCOUNTER — Encounter: Payer: Self-pay | Admitting: Internal Medicine

## 2023-09-16 ENCOUNTER — Ambulatory Visit (INDEPENDENT_AMBULATORY_CARE_PROVIDER_SITE_OTHER): Payer: Medicare Other | Admitting: Internal Medicine

## 2023-09-16 ENCOUNTER — Ambulatory Visit (INDEPENDENT_AMBULATORY_CARE_PROVIDER_SITE_OTHER)

## 2023-09-16 VITALS — BP 130/74 | HR 63 | Temp 98.2°F | Ht 76.0 in | Wt 299.0 lb

## 2023-09-16 DIAGNOSIS — Z125 Encounter for screening for malignant neoplasm of prostate: Secondary | ICD-10-CM | POA: Diagnosis not present

## 2023-09-16 DIAGNOSIS — Z7901 Long term (current) use of anticoagulants: Secondary | ICD-10-CM

## 2023-09-16 DIAGNOSIS — E7800 Pure hypercholesterolemia, unspecified: Secondary | ICD-10-CM | POA: Diagnosis not present

## 2023-09-16 DIAGNOSIS — G8929 Other chronic pain: Secondary | ICD-10-CM

## 2023-09-16 DIAGNOSIS — M25512 Pain in left shoulder: Secondary | ICD-10-CM | POA: Diagnosis not present

## 2023-09-16 DIAGNOSIS — M25511 Pain in right shoulder: Secondary | ICD-10-CM | POA: Diagnosis not present

## 2023-09-16 DIAGNOSIS — E538 Deficiency of other specified B group vitamins: Secondary | ICD-10-CM

## 2023-09-16 DIAGNOSIS — E559 Vitamin D deficiency, unspecified: Secondary | ICD-10-CM

## 2023-09-16 DIAGNOSIS — Z0000 Encounter for general adult medical examination without abnormal findings: Secondary | ICD-10-CM

## 2023-09-16 DIAGNOSIS — R1310 Dysphagia, unspecified: Secondary | ICD-10-CM

## 2023-09-16 DIAGNOSIS — Z0001 Encounter for general adult medical examination with abnormal findings: Secondary | ICD-10-CM

## 2023-09-16 DIAGNOSIS — I10 Essential (primary) hypertension: Secondary | ICD-10-CM

## 2023-09-16 DIAGNOSIS — R739 Hyperglycemia, unspecified: Secondary | ICD-10-CM | POA: Diagnosis not present

## 2023-09-16 LAB — BASIC METABOLIC PANEL WITH GFR
BUN: 12 mg/dL (ref 6–23)
CO2: 30 meq/L (ref 19–32)
Calcium: 8.9 mg/dL (ref 8.4–10.5)
Chloride: 104 meq/L (ref 96–112)
Creatinine, Ser: 1.31 mg/dL (ref 0.40–1.50)
GFR: 55.36 mL/min — ABNORMAL LOW (ref >60.00)
Glucose, Bld: 96 mg/dL (ref 70–99)
Potassium: 4.4 meq/L (ref 3.5–5.1)
Sodium: 139 meq/L (ref 135–145)

## 2023-09-16 LAB — CBC WITH DIFFERENTIAL/PLATELET
Basophils Absolute: 0.1 10*3/uL (ref 0.0–0.1)
Basophils Relative: 1 % (ref 0.0–3.0)
Eosinophils Absolute: 0.1 10*3/uL (ref 0.0–0.7)
Eosinophils Relative: 2.6 % (ref 0.0–5.0)
HCT: 40.3 % (ref 39.0–52.0)
Hemoglobin: 13.5 g/dL (ref 13.0–17.0)
Lymphocytes Relative: 29.4 % (ref 12.0–46.0)
Lymphs Abs: 1.7 10*3/uL (ref 0.7–4.0)
MCHC: 33.5 g/dL (ref 30.0–36.0)
MCV: 85.7 fl (ref 78.0–100.0)
Monocytes Absolute: 0.4 10*3/uL (ref 0.1–1.0)
Monocytes Relative: 6.7 % (ref 3.0–12.0)
Neutro Abs: 3.5 10*3/uL (ref 1.4–7.7)
Neutrophils Relative %: 60.3 % (ref 43.0–77.0)
Platelets: 226 10*3/uL (ref 150.0–400.0)
RBC: 4.71 Mil/uL (ref 4.22–5.81)
RDW: 13.4 % (ref 11.5–15.5)
WBC: 5.7 10*3/uL (ref 4.0–10.5)

## 2023-09-16 LAB — LIPID PANEL
Cholesterol: 116 mg/dL (ref 0–200)
HDL: 37 mg/dL — ABNORMAL LOW (ref >39.00)
LDL Cholesterol: 41 mg/dL (ref 0–99)
NonHDL: 79.26
Total CHOL/HDL Ratio: 3
Triglycerides: 193 mg/dL — ABNORMAL HIGH (ref 0.0–149.0)
VLDL: 38.6 mg/dL (ref 0.0–40.0)

## 2023-09-16 LAB — HEMOGLOBIN A1C: Hgb A1c MFr Bld: 6 % (ref 4.6–6.5)

## 2023-09-16 LAB — VITAMIN D 25 HYDROXY (VIT D DEFICIENCY, FRACTURES): VITD: 30.53 ng/mL (ref 30.00–100.00)

## 2023-09-16 LAB — URINALYSIS, ROUTINE W REFLEX MICROSCOPIC
Bilirubin Urine: NEGATIVE
Hgb urine dipstick: NEGATIVE
Ketones, ur: NEGATIVE
Leukocytes,Ua: NEGATIVE
Nitrite: NEGATIVE
RBC / HPF: NONE SEEN (ref 0–?)
Specific Gravity, Urine: <=1.005 — AB (ref 1.000–1.030)
Total Protein, Urine: NEGATIVE
Urine Glucose: NEGATIVE
Urobilinogen, UA: 0.2 (ref 0.0–1.0)
pH: 6 (ref 5.0–8.0)

## 2023-09-16 LAB — HEPATIC FUNCTION PANEL
ALT: 18 U/L (ref 0–53)
AST: 17 U/L (ref 0–37)
Albumin: 4.1 g/dL (ref 3.5–5.2)
Alkaline Phosphatase: 72 U/L (ref 39–117)
Bilirubin, Direct: 0.3 mg/dL (ref 0.0–0.3)
Total Bilirubin: 1.6 mg/dL — ABNORMAL HIGH (ref 0.2–1.2)
Total Protein: 6.6 g/dL (ref 6.0–8.3)

## 2023-09-16 LAB — MICROALBUMIN / CREATININE URINE RATIO
Creatinine,U: 90.2 mg/dL
Microalb Creat Ratio: UNDETERMINED mg/g (ref 0.0–30.0)
Microalb, Ur: <0.7 mg/dL

## 2023-09-16 LAB — PSA: PSA: 2.54 ng/mL (ref 0.10–4.00)

## 2023-09-16 LAB — POCT INR: INR: 2.7 (ref 2.0–3.0)

## 2023-09-16 LAB — VITAMIN B12: Vitamin B-12: 391 pg/mL (ref 211–911)

## 2023-09-16 LAB — TSH: TSH: 3.83 u[IU]/mL (ref 0.35–5.50)

## 2023-09-16 MED ORDER — OMEPRAZOLE 40 MG PO CPDR: DELAYED_RELEASE_CAPSULE | ORAL | 3 refills | Status: DC

## 2023-09-16 MED ORDER — ROSUVASTATIN CALCIUM 20 MG PO TABS: 20.0000 mg | ORAL_TABLET | Freq: Every day | ORAL | 3 refills | Status: AC

## 2023-09-16 MED ORDER — METOPROLOL SUCCINATE ER 50 MG PO TB24: 25.0000 mg | ORAL_TABLET | Freq: Every day | ORAL | 3 refills | Status: DC

## 2023-09-16 MED ORDER — WARFARIN SODIUM 4 MG PO TABS: ORAL_TABLET | ORAL | 1 refills | Status: DC

## 2023-09-16 NOTE — Patient Instructions (Addendum)
 Pre visit review using our clinic review tool, if applicable. No additional management support is needed unless otherwise documented below in the visit note.  Continue 2 tablets daily except take 1 tablet on Mondays, Wednesdays and Fridays. Recheck in 6 weeks.

## 2023-09-16 NOTE — Progress Notes (Signed)
 The test results show that your current treatment is OK, as the tests are stable.  Please continue the same plan.  There is no other need for change of treatment or further evaluation based on these results, at this time.  thanks

## 2023-09-16 NOTE — Patient Instructions (Addendum)
 Please have your Shingrix (shingles) shots done at your local pharmacy., and the Tdap tetanus shot  Please continue all other medications as before, and refills have been done if requested.  Please have the pharmacy call with any other refills you may need.  Please continue your efforts at being more active, low cholesterol diet, and weight control.  You are otherwise up to date with prevention measures today.  Please keep your appointments with your specialists as you may have planned  You will be contacted regarding the referral for: Sports medicine, and Gastroenterology  Please let us  know if you change your mind about the Pulmonary referral for possible sleep apnea  Please go to the LAB at the blood drawing area for the tests to be done  You will be contacted by phone if any changes need to be made immediately.  Otherwise, you will receive a letter about your results with an explanation, but please check with MyChart first.  Please make an Appointment to return in 6 months, or sooner if needed

## 2023-09-16 NOTE — Progress Notes (Signed)
 Pt also has PCP apt today. Continue 2 tablets daily except take 1 tablet on Mondays, Wednesdays and Fridays. Recheck in 6 weeks.

## 2023-09-16 NOTE — Progress Notes (Signed)
 Patient ID: PIO STADTLANDER, male   DOB: 08-14-1955, 68 y.o.   MRN: [id number]         Chief Complaint:: wellness exam and htn, hypersomnolence, depression, bilateral shoulder pain, dysphagia       HPI:  SHADIE MUDD is a 68 y.o. male here for wellness exam; for tdap, shingrix, prevnar at the pharmacy, declines colonoscopy, o/w up to date                        Also BP improved with losartan  100 mg every day,  Does have hypersomnolence but declines referral for f/u OSA for now.  Also with mild worsening depressive symptoms, but no suicidal ideation, or panic; declines med tx or counseling referral need.  Pt denies chest pain, increased sob or doe, wheezing, orthopnea, PND, increased LE swelling, palpitations, dizziness or syncope.   Pt denies polydipsia, polyuria, or new focal neuro s/s.   Also with 1 mo worsening dysphagia to solids but Denies worsening reflux, abd pain, n/v, bowel change or blood.  Also has bialteral shoulder pain with reduced ROM after months of sedentary activity over the past yr.     Wt Readings from Last 3 Encounters:  09/16/23 299 lb (135.6 kg)  04/24/23 (!) 303 lb (137.4 kg)  04/16/23 (!) 303 lb (137.4 kg)   BP Readings from Last 3 Encounters:  09/16/23 130/74  04/24/23 (!) 147/87  04/16/23 132/74   Immunization History  Administered Date(s) Administered   Fluad Quad(high Dose 65+) 03/07/2020, 02/02/2021, 03/13/2022   Fluad Trivalent(High Dose 65+) 02/25/2023   Influenza,inj,Quad PF,6+ Mos 06/25/2017, 06/05/2018   PFIZER(Purple Top)SARS-COV-2 Vaccination 07/26/2019, 08/16/2019   Pfizer(Comirnaty)Fall Seasonal Vaccine 12 years and older 04/02/2023   Health Maintenance Due  Topic Date Due   DTaP/Tdap/Td (1 - Tdap) Never done   Zoster Vaccines- Shingrix (1 of 2) Never done   Colonoscopy  Never done   Pneumonia Vaccine 66+ Years old (1 of 1 - PCV) Never done      Past Medical History:  Diagnosis Date   A-fib (HCC)    post PE only   Anxiety state 04/20/2007    Qualifier: Diagnosis of  By: Autry Legions MD, Alveda Aures    DVT (deep venous thrombosis) (HCC)    2000   Dysphagia 09/13/2016   Erectile dysfunction 05/28/2011   Eustachian tube dysfunction, right 06/25/2017   GERD (gastroesophageal reflux disease)    Hypertension    Long term current use of anticoagulant 07/03/2010   Pulmonary embolism (HCC)    Radiculitis of right cervical region 09/13/2016   Right knee pain 06/25/2017   Shingles 02/06/2012   Symptomatic PVCs    Past Surgical History:  Procedure Laterality Date   BREAST SURGERY     TONSILLECTOMY     VEIN SURGERY     RLE saphenous vein stripping    reports that he has quit smoking. He has never used smokeless tobacco. He reports that he does not drink alcohol and does not use drugs. family history includes Diabetes in an other family member; Heart attack in his father. Allergies  Allergen Reactions   Oxycodone Other (See Comments)    "Just made me feel bad"   Current Outpatient Medications on File Prior to Visit  Medication Sig Dispense Refill   Cholecalciferol (VITAMIN D3) 50 MCG (2000 UT) capsule Take 2 capsules (4,000 Units total) by mouth daily. 99 capsule 99   triamcinolone  (NASACORT ) 55 MCG/ACT AERO nasal inhaler Place 2  sprays into the nose daily. 1 each 12   losartan  (COZAAR ) 100 MG tablet Take 1 tablet (100 mg total) by mouth daily. 90 tablet 3   No current facility-administered medications on file prior to visit.        ROS:  All others reviewed and negative.  Objective        PE:  BP 130/74 (BP Location: Right Arm, Patient Position: Sitting, Cuff Size: Normal)   Pulse 63   Temp 98.2 F (36.8 C) (Oral)   Ht 6\' 4"  (1.93 m)   Wt 299 lb (135.6 kg)   SpO2 98%   BMI 36.40 kg/m                 Constitutional: Pt appears in NAD               HENT: Head: NCAT.                Right Ear: External ear normal.                 Left Ear: External ear normal.                Eyes: . Pupils are equal, round, and reactive to light.  Conjunctivae and EOM are normal               Nose: without d/c or deformity               Neck: Neck supple. Gross normal ROM               Cardiovascular: Normal rate and regular rhythm.                 Pulmonary/Chest: Effort normal and breath sounds without rales or wheezing.                Abd:  Soft, NT, ND, + BS, no organomegaly               Neurological: Pt is alert. At baseline orientation, motor grossly intact               Skin: Skin is warm. No rashes, no other new lesions, LE edema - none               Bilat shoulders with mild subacromial tender and ROM to 90 degrees only               Psychiatric: Pt behavior is normal without agitation   Micro: none  Cardiac tracings I have personally interpreted today:  none  Pertinent Radiological findings (summarize): none   Lab Results  Component Value Date   WBC 5.7 09/16/2023   HGB 13.5 09/16/2023   HCT 40.3 09/16/2023   PLT 226.0 09/16/2023   GLUCOSE 96 09/16/2023   CHOL 116 09/16/2023   TRIG 193.0 (H) 09/16/2023   HDL 37.00 (L) 09/16/2023   LDLDIRECT 124.0 09/10/2021   LDLCALC 41 09/16/2023   ALT 18 09/16/2023   AST 17 09/16/2023   NA 139 09/16/2023   K 4.4 09/16/2023   CL 104 09/16/2023   CREATININE 1.31 09/16/2023   BUN 12 09/16/2023   CO2 30 09/16/2023   TSH 3.83 09/16/2023   PSA 2.54 09/16/2023   INR 2.7 09/16/2023   HGBA1C 6.0 09/16/2023   MICROALBUR <0.7 09/16/2023   Assessment/Plan:  RAJENDRA STANCATO is a 68 y.o. White or Caucasian [1] male with  has a past medical history of A-fib [facility]), Anxiety  state (04/20/2007), DVT (deep venous thrombosis) (HCC), Dysphagia (09/13/2016), Erectile dysfunction (05/28/2011), Eustachian tube dysfunction, right (06/25/2017), GERD (gastroesophageal reflux disease), Hypertension, Long term current use of anticoagulant (07/03/2010), Pulmonary embolism (HCC), Radiculitis of right cervical region (09/13/2016), Right knee pain (06/25/2017), Shingles (02/06/2012), and Symptomatic  PVCs.  Encounter for well adult exam with abnormal findings Age and sex appropriate education and counseling updated with regular exercise and diet Referrals for preventative services - none needed Immunizations addressed - for tdap, shingrix and prevnar at pharmacy Smoking counseling  - none needed Evidence for depression or other mood disorder - none significant Most recent labs reviewed. I have personally reviewed and have noted: 1) the patient's medical and social history 2) The patient's current medications and supplements 3) The patient's height, weight, and BMI have been recorded in the chart   Hypertension, uncontrolled BP Readings from Last 3 Encounters:  09/16/23 130/74  04/24/23 (!) 147/87  04/16/23 132/74   Stable, pt to continue medical treatment losartan  100 mg every day, toprol  xl 25 mg qd   Hyperglycemia Lab Results  Component Value Date   HGBA1C 6.0 09/16/2023   Stable, pt to continue current medical treatment  - diet,w t control   HLD (hyperlipidemia) Lab Results  Component Value Date   LDLCALC 41 09/16/2023   Stable, pt to continue current statin crestor  20 mg qd   B12 deficiency Lab Results  Component Value Date   VITAMINB12 391 09/16/2023   Stable, cont oral replacement - b12 1000 mcg qd   Vitamin D  deficiency Last vitamin D  Lab Results  Component Value Date   VD25OH 30.53 09/16/2023   Low start oral replacement   Dysphagia Also for GI referral, may need EGD  Chronic pain of both shoulders ? Frozen shoulders vs other - for sport med referral  Followup: Return in about 6 months (around 03/17/2024).  Rosalia Colonel, MD 09/20/2023 9:14 PM [facility]

## 2023-09-20 ENCOUNTER — Encounter: Payer: Self-pay | Admitting: Internal Medicine

## 2023-09-20 DIAGNOSIS — G8929 Other chronic pain: Secondary | ICD-10-CM | POA: Insufficient documentation

## 2023-09-20 NOTE — Assessment & Plan Note (Signed)
 Age and sex appropriate education and counseling updated with regular exercise and diet Referrals for preventative services - none needed Immunizations addressed - for tdap, shingrix and prevnar at pharmacy Smoking counseling  - none needed Evidence for depression or other mood disorder - none significant Most recent labs reviewed. I have personally reviewed and have noted: 1) the patient's medical and social history 2) The patient's current medications and supplements 3) The patient's height, weight, and BMI have been recorded in the chart

## 2023-09-20 NOTE — Assessment & Plan Note (Signed)
 Last vitamin D  Lab Results  Component Value Date   VD25OH 30.53 09/16/2023   Low start oral replacement

## 2023-09-20 NOTE — Assessment & Plan Note (Signed)
 Lab Results  Component Value Date   LDLCALC 41 09/16/2023   Stable, pt to continue current statin crestor  20 mg qd

## 2023-09-20 NOTE — Assessment & Plan Note (Signed)
 Lab Results  Component Value Date   HGBA1C 6.0 09/16/2023   Stable, pt to continue current medical treatment  - diet,w t control

## 2023-09-20 NOTE — Assessment & Plan Note (Signed)
 BP Readings from Last 3 Encounters:  09/16/23 130/74  04/24/23 (!) 147/87  04/16/23 132/74   Stable, pt to continue medical treatment losartan  100 mg every day, toprol  xl 25 mg qd

## 2023-09-20 NOTE — Assessment & Plan Note (Signed)
?   Frozen shoulders vs other - for sport med referral

## 2023-09-20 NOTE — Assessment & Plan Note (Signed)
Also for GI referral, may need EGD 

## 2023-09-20 NOTE — Assessment & Plan Note (Signed)
 Lab Results  Component Value Date   VITAMINB12 391 09/16/2023   Stable, cont oral replacement - b12 1000 mcg qd

## 2023-10-12 DIAGNOSIS — R051 Acute cough: Secondary | ICD-10-CM | POA: Diagnosis not present

## 2023-10-12 DIAGNOSIS — R0981 Nasal congestion: Secondary | ICD-10-CM | POA: Diagnosis not present

## 2023-10-14 ENCOUNTER — Encounter: Payer: Self-pay | Admitting: Internal Medicine

## 2023-10-14 ENCOUNTER — Ambulatory Visit (INDEPENDENT_AMBULATORY_CARE_PROVIDER_SITE_OTHER): Admitting: Internal Medicine

## 2023-10-14 VITALS — BP 120/70 | HR 82 | Temp 99.7°F | Ht 76.0 in | Wt 295.0 lb

## 2023-10-14 DIAGNOSIS — E538 Deficiency of other specified B group vitamins: Secondary | ICD-10-CM

## 2023-10-14 DIAGNOSIS — E559 Vitamin D deficiency, unspecified: Secondary | ICD-10-CM | POA: Diagnosis not present

## 2023-10-14 DIAGNOSIS — J019 Acute sinusitis, unspecified: Secondary | ICD-10-CM

## 2023-10-14 DIAGNOSIS — I10 Essential (primary) hypertension: Secondary | ICD-10-CM

## 2023-10-14 DIAGNOSIS — R739 Hyperglycemia, unspecified: Secondary | ICD-10-CM | POA: Diagnosis not present

## 2023-10-14 MED ORDER — HYDROCODONE BIT-HOMATROP MBR 5-1.5 MG/5ML PO SOLN: 5.0000 mL | Freq: Four times a day (QID) | ORAL | 0 refills | Status: AC | PRN

## 2023-10-14 MED ORDER — AZITHROMYCIN 250 MG PO TABS: ORAL_TABLET | ORAL | 1 refills | Status: AC

## 2023-10-14 NOTE — Assessment & Plan Note (Signed)
 BP Readings from Last 3 Encounters:  10/14/23 120/70  09/16/23 130/74  04/24/23 (!) 147/87   Stable, pt to continue medical treatment losartan  100 mg every day, toprol  xl 25 mg qd

## 2023-10-14 NOTE — Assessment & Plan Note (Signed)
 Last vitamin D  Lab Results  Component Value Date   VD25OH 30.53 09/16/2023   Low, to start oral replacement

## 2023-10-14 NOTE — Assessment & Plan Note (Signed)
 Lab Results  Component Value Date   HGBA1C 6.0 09/16/2023   Stable, pt to continue current medical treatment  - diet,w t control

## 2023-10-14 NOTE — Assessment & Plan Note (Signed)
 Lab Results  Component Value Date   VITAMINB12 391 09/16/2023   Stable, cont oral replacement - b12 1000 mcg qd

## 2023-10-14 NOTE — Patient Instructions (Signed)
 Please take all new medication as prescribed - the antibiotic, and cough medicine as needed  Please continue all other medications as before, and refills have been done if requested.  Please have the pharmacy call with any other refills you may need.  Please keep your appointments with your specialists as you may have planned

## 2023-10-14 NOTE — Progress Notes (Signed)
 Patient ID: Justin Woodard, male   DOB: 1955/08/09, 68 y.o.   MRN: [id number]        Chief Complaint: follow up sinusitis       HPI:  Justin Woodard is a 68 y.o. male  Here with 5 days acute onset fever, facial pain, pressure, headache, general weakness and malaise, and greenish d/c, with mild ST and cough, but pt denies chest pain, wheezing, increased sob or doe, orthopnea, PND, increased LE swelling, palpitations, dizziness or syncope.  Was seen at UC 3 days ago and tx with cefdinir and astelin spray, and prednisone , but did not take the cefdinir due to warning about his coumadin , and prednisone .           Wt Readings from Last 3 Encounters:  10/14/23 295 lb (133.8 kg)  09/16/23 299 lb (135.6 kg)  04/24/23 (!) 303 lb (137.4 kg)   BP Readings from Last 3 Encounters:  10/14/23 120/70  09/16/23 130/74  04/24/23 (!) 147/87         Past Medical History:  Diagnosis Date   A-fib [facility])    post PE only   Anxiety state 04/20/2007   Qualifier: Diagnosis of  By: Autry Legions MD, Alveda Aures    DVT (deep venous thrombosis) (HCC)    2000   Dysphagia 09/13/2016   Erectile dysfunction 05/28/2011   Eustachian tube dysfunction, right 06/25/2017   GERD (gastroesophageal reflux disease)    Hypertension    Long term current use of anticoagulant 07/03/2010   Pulmonary embolism (HCC)    Radiculitis of right cervical region 09/13/2016   Right knee pain 06/25/2017   Shingles 02/06/2012   Symptomatic PVCs    Past Surgical History:  Procedure Laterality Date   BREAST SURGERY     TONSILLECTOMY     VEIN SURGERY     RLE saphenous vein stripping    reports that he has quit smoking. He has never used smokeless tobacco. He reports that he does not drink alcohol and does not use drugs. family history includes Diabetes in an other family member; Heart attack in his father. Allergies  Allergen Reactions   Oxycodone Other (See Comments)    "Just made me feel bad"   Current Outpatient Medications on File Prior to Visit   Medication Sig Dispense Refill   Azelastine HCl 137 MCG/SPRAY SOLN Place into both nostrils.     cefdinir (OMNICEF) 300 MG capsule Take 300 mg by mouth 2 (two) times daily.     Cholecalciferol (VITAMIN D3) 50 MCG (2000 UT) capsule Take 2 capsules (4,000 Units total) by mouth daily. 99 capsule 99   metoprolol  succinate (TOPROL -XL) 50 MG 24 hr tablet Take 0.5 tablets (25 mg total) by mouth daily. Take with or immediately following a meal. 45 tablet 3   omeprazole  (PRILOSEC) 40 MG capsule Take 1 capsule 40 mg by mouth daily before breakfast. 90 capsule 3   rosuvastatin  (CRESTOR ) 20 MG tablet Take 1 tablet (20 mg total) by mouth daily. 90 tablet 3   triamcinolone  (NASACORT ) 55 MCG/ACT AERO nasal inhaler Place 2 sprays into the nose daily. 1 each 12   warfarin (COUMADIN ) 4 MG tablet TAKE 2 TABLETS BY MOUTH DAILY EXCEPT TAKE 1 TABLET ON MONDAYS, WEDNESDAYS AND FRIDAYS OR AS DIRECTED BY COAGULATION CLINIC 165 tablet 1   losartan  (COZAAR ) 100 MG tablet Take 1 tablet (100 mg total) by mouth daily. 90 tablet 3   No current facility-administered medications on file prior to visit.  ROS:  All others reviewed and negative.  Objective        PE:  BP 120/70 (BP Location: Right Arm, Patient Position: Sitting, Cuff Size: Normal)   Pulse 82   Temp 99.7 F (37.6 C) (Oral)   Ht 6\' 4"  (1.93 m)   Wt 295 lb (133.8 kg)   SpO2 98%   BMI 35.91 kg/m                 Constitutional: Pt appears mild ill               HENT: Head: NCAT.                Right Ear: External ear normal.                 Left Ear: External ear normal. Bilat tm's with mild erythema.  Max sinus areas mild tender.  Pharynx with mild erythema, no exudate               Eyes: . Pupils are equal, round, and reactive to light. Conjunctivae and EOM are normal               Nose: without d/c or deformity               Neck: Neck supple. Gross normal ROM               Cardiovascular: Normal rate and regular rhythm.                  Pulmonary/Chest: Effort normal and breath sounds without rales or wheezing.                Neurological: Pt is alert. At baseline orientation, motor grossly intact               Skin: Skin is warm. No rashes, no other new lesions, LE edema - none               Psychiatric: Pt behavior is normal without agitation   Micro: none  Cardiac tracings I have personally interpreted today:  none  Pertinent Radiological findings (summarize): none   Lab Results  Component Value Date   WBC 5.7 09/16/2023   HGB 13.5 09/16/2023   HCT 40.3 09/16/2023   PLT 226.0 09/16/2023   GLUCOSE 96 09/16/2023   CHOL 116 09/16/2023   TRIG 193.0 (H) 09/16/2023   HDL 37.00 (L) 09/16/2023   LDLDIRECT 124.0 09/10/2021   LDLCALC 41 09/16/2023   ALT 18 09/16/2023   AST 17 09/16/2023   NA 139 09/16/2023   K 4.4 09/16/2023   CL 104 09/16/2023   CREATININE 1.31 09/16/2023   BUN 12 09/16/2023   CO2 30 09/16/2023   TSH 3.83 09/16/2023   PSA 2.54 09/16/2023   INR 2.7 09/16/2023   HGBA1C 6.0 09/16/2023   MICROALBUR <0.7 09/16/2023   Assessment/Plan:  Justin Woodard is a 68 y.o. White or Caucasian [1] male with  has a past medical history of A-fib [facility]), Anxiety state (04/20/2007), DVT (deep venous thrombosis) (HCC), Dysphagia (09/13/2016), Erectile dysfunction (05/28/2011), Eustachian tube dysfunction, right (06/25/2017), GERD (gastroesophageal reflux disease), Hypertension, Long term current use of anticoagulant (07/03/2010), Pulmonary embolism (HCC), Radiculitis of right cervical region (09/13/2016), Right knee pain (06/25/2017), Shingles (02/06/2012), and Symptomatic PVCs.  Sinusitis Mild to mod, for antibx course zpack, and cough med prn,  to f/u any worsening symptoms or concerns  Vitamin D  deficiency Last vitamin D  Lab Results  Component Value Date   VD25OH 30.53 09/16/2023   Low, to start oral replacement   B12 deficiency Lab Results  Component Value Date   VITAMINB12 391 09/16/2023   Stable, cont oral  replacement - b12 1000 mcg qd   Hyperglycemia Lab Results  Component Value Date   HGBA1C 6.0 09/16/2023   Stable, pt to continue current medical treatment  - diet,wt control   Hypertension, uncontrolled BP Readings from Last 3 Encounters:  10/14/23 120/70  09/16/23 130/74  04/24/23 (!) 147/87   Stable, pt to continue medical treatment losartan  100 mg every day, toprol  xl 25 mg qd  Followup: Return if symptoms worsen or fail to improve.  Rosalia Colonel, MD 10/14/2023 7:36 PM [facility] [place] [facility] - [facility]

## 2023-10-14 NOTE — Assessment & Plan Note (Signed)
Mild to mod, for antibx course zpack, and cough med prn,  to f/u any worsening symptoms or concerns

## 2023-10-16 ENCOUNTER — Telehealth: Payer: Self-pay | Admitting: Internal Medicine

## 2023-10-16 ENCOUNTER — Emergency Department (HOSPITAL_COMMUNITY)

## 2023-10-16 ENCOUNTER — Encounter: Payer: Self-pay | Admitting: Internal Medicine

## 2023-10-16 ENCOUNTER — Encounter (HOSPITAL_COMMUNITY): Payer: Self-pay | Admitting: *Deleted

## 2023-10-16 ENCOUNTER — Other Ambulatory Visit: Payer: Self-pay

## 2023-10-16 ENCOUNTER — Emergency Department (HOSPITAL_COMMUNITY)
Admission: EM | Admit: 2023-10-16 | Discharge: 2023-10-16 | Disposition: A | Discharge status: Home / Self Care | Attending: Emergency Medicine | Admitting: Emergency Medicine

## 2023-10-16 DIAGNOSIS — Z86718 Personal history of other venous thrombosis and embolism: Secondary | ICD-10-CM | POA: Diagnosis not present

## 2023-10-16 DIAGNOSIS — R000 Tachycardia, unspecified: Secondary | ICD-10-CM | POA: Insufficient documentation

## 2023-10-16 DIAGNOSIS — R918 Other nonspecific abnormal finding of lung field: Secondary | ICD-10-CM

## 2023-10-16 DIAGNOSIS — R531 Weakness: Secondary | ICD-10-CM

## 2023-10-16 DIAGNOSIS — R61 Generalized hyperhidrosis: Secondary | ICD-10-CM | POA: Insufficient documentation

## 2023-10-16 DIAGNOSIS — R791 Abnormal coagulation profile: Secondary | ICD-10-CM

## 2023-10-16 DIAGNOSIS — Z79899 Other long term (current) drug therapy: Secondary | ICD-10-CM | POA: Insufficient documentation

## 2023-10-16 DIAGNOSIS — K449 Diaphragmatic hernia without obstruction or gangrene: Secondary | ICD-10-CM | POA: Diagnosis not present

## 2023-10-16 DIAGNOSIS — I10 Essential (primary) hypertension: Secondary | ICD-10-CM | POA: Insufficient documentation

## 2023-10-16 DIAGNOSIS — Z7901 Long term (current) use of anticoagulants: Secondary | ICD-10-CM | POA: Insufficient documentation

## 2023-10-16 LAB — CBC
HCT: 34.6 % — ABNORMAL LOW (ref 39.0–52.0)
Hemoglobin: 11.5 g/dL — ABNORMAL LOW (ref 13.0–17.0)
MCH: 28.3 pg (ref 26.0–34.0)
MCHC: 33.2 g/dL (ref 30.0–36.0)
MCV: 85.2 fL (ref 80.0–100.0)
Platelets: 187 10*3/uL (ref 150–400)
RBC: 4.06 MIL/uL — ABNORMAL LOW (ref 4.22–5.81)
RDW: 14.2 % (ref 11.5–15.5)
WBC: 6.2 10*3/uL (ref 4.0–10.5)
nRBC: 0 % (ref 0.0–0.2)

## 2023-10-16 LAB — PROTIME-INR
INR: 1.4 — ABNORMAL HIGH (ref 0.8–1.2)
Prothrombin Time: 17.8 s — ABNORMAL HIGH (ref 11.4–15.2)

## 2023-10-16 LAB — BASIC METABOLIC PANEL WITH GFR
Anion gap: 10 (ref 5–15)
BUN: 15 mg/dL (ref 8–23)
CO2: 23 mmol/L (ref 22–32)
Calcium: 8.3 mg/dL — ABNORMAL LOW (ref 8.9–10.3)
Chloride: 100 mmol/L (ref 98–111)
Creatinine, Ser: 1.48 mg/dL — ABNORMAL HIGH (ref 0.61–1.24)
GFR, Estimated: 51 mL/min — ABNORMAL LOW (ref >60)
Glucose, Bld: 129 mg/dL — ABNORMAL HIGH (ref 70–99)
Potassium: 4.5 mmol/L (ref 3.5–5.1)
Sodium: 133 mmol/L — ABNORMAL LOW (ref 135–145)

## 2023-10-16 LAB — TROPONIN I (HIGH SENSITIVITY)
Troponin I (High Sensitivity): 8 ng/L (ref <18)
Troponin I (High Sensitivity): 7 ng/L (ref <18)

## 2023-10-16 MED ORDER — IOHEXOL 350 MG/ML SOLN
75.0000 mL | Freq: Once | INTRAVENOUS | Status: AC | PRN
  Administered 2023-10-16: 75 mL via INTRAVENOUS

## 2023-10-16 NOTE — ED Triage Notes (Signed)
 The pt awakened approx one hour just sweating and his heart was beating around 135 then slowed down when he was in the car on the way up here he is also c/o pain in both his legs frm the hips down hx of pes and dvt  the pt takes coumadin 

## 2023-10-16 NOTE — ED Provider Notes (Signed)
 [place] EMERGENCY DEPARTMENT AT [facility] Provider Note  CSN: [id number] Arrival date & time: 10/16/23 0109  Chief Complaint(s) Tachycardia  HPI Justin Woodard is a 68 y.o. male with a past medical history listed below including long-term Coumadin  use for prior DVTs, PEs and A-fib here for episode of diaphoresis at home and tachycardia.  Patient reports that he was asleep on the couch.  Around midnight, he awoke feeling hot and sweaty.  When he checked his pulse ox he noted elevated heart rate in the 130s.  This lasted for approximately 30 to 40 minutes and resolved and route to the emergency department after sitting in front of the [facility] event during a ride.  He denied any associated chest pain or shortness of breath.  He denies any recent fevers.  Does endorse currently being treated for sinus infection with azithromycin .  No nausea or vomiting.  No diarrhea.    Patient also endorses likely a superficial thrombophlebitis in bilateral thigh varicose veins.  The history is provided by the patient.    Past Medical History Past Medical History:  Diagnosis Date   A-fib [facility])    post PE only   Anxiety state 04/20/2007   Qualifier: Diagnosis of  By: Autry Legions MD, Alveda Aures    DVT (deep venous thrombosis) (HCC)    2000   Dysphagia 09/13/2016   Erectile dysfunction 05/28/2011   Eustachian tube dysfunction, right 06/25/2017   GERD (gastroesophageal reflux disease)    Hypertension    Long term current use of anticoagulant 07/03/2010   Pulmonary embolism (HCC)    Radiculitis of right cervical region 09/13/2016   Right knee pain 06/25/2017   Shingles 02/06/2012   Symptomatic PVCs    Patient Active Problem List   Diagnosis Date Noted   Chronic pain of both shoulders 09/20/2023   Nocturia 03/13/2022   Aortic atherosclerosis (HCC) 09/07/2020   HLD (hyperlipidemia) 02/18/2020   B12 deficiency 02/18/2020   Vitamin D  deficiency 02/18/2020   Dizziness 08/13/2019   Migraine 07/29/2019   Near  syncope 12/11/2018   Cough 01/26/2018   Right knee pain 06/25/2017   Eustachian tube dysfunction, right 06/25/2017   Allergic rhinitis 06/25/2017   Sinusitis 11/29/2016   Dysphagia 09/13/2016   Radiculitis of right cervical region 09/13/2016   Hyperglycemia 08/08/2015   Encounter for therapeutic drug monitoring 06/22/2013   Shingles 02/06/2012   Erectile dysfunction 05/28/2011   Encounter for well adult exam with abnormal findings 05/25/2011   Long term current use of anticoagulant 07/03/2010   Depression with anxiety 04/20/2007   Hypertension, uncontrolled 04/20/2007   PE 04/20/2007   ATRIAL FIBRILLATION 04/20/2007   PREMATURE VENTRICULAR CONTRACTIONS 04/20/2007   GERD 04/20/2007   CHEST PAIN 04/20/2007   Personal history of venous thrombosis and embolism 04/20/2007   DEEP VENOUS THROMBOPHLEBITIS, HX OF 04/20/2007   Home Medication(s) Prior to Admission medications   Medication Sig Start Date End Date Taking? Authorizing Provider  Azelastine HCl 137 MCG/SPRAY SOLN Place into both nostrils. 10/13/23   [provider]  azithromycin  (ZITHROMAX ) 250 MG tablet Take 2 tablets on day 1, then 1 tablet daily on days 2 through 5 10/14/23 10/19/23  Roslyn Coombe, MD  cefdinir (OMNICEF) 300 MG capsule Take 300 mg by mouth 2 (two) times daily. 10/13/23   [provider]  Cholecalciferol (VITAMIN D3) 50 MCG (2000 UT) capsule Take 2 capsules (4,000 Units total) by mouth daily. 03/13/22   Roslyn Coombe, MD  HYDROcodone  bit-homatropine [facility]) 5-1.5  MG/5ML syrup Take 5 mLs by mouth every 6 (six) hours as needed for up to 10 days. 10/14/23 10/24/23  Roslyn Coombe, MD  losartan  (COZAAR ) 100 MG tablet Take 1 tablet (100 mg total) by mouth daily. 04/16/23 07/15/23  Tammie Fall, MD  metoprolol  succinate (TOPROL -XL) 50 MG 24 hr tablet Take 0.5 tablets (25 mg total) by mouth daily. Take with or immediately following a meal. 09/16/23   Roslyn Coombe, MD  omeprazole  (PRILOSEC) 40 MG capsule  Take 1 capsule 40 mg by mouth daily before breakfast. 09/16/23   Roslyn Coombe, MD  rosuvastatin  (CRESTOR ) 20 MG tablet Take 1 tablet (20 mg total) by mouth daily. 09/16/23   Roslyn Coombe, MD  triamcinolone  (NASACORT ) 55 MCG/ACT AERO nasal inhaler Place 2 sprays into the nose daily. 09/07/20   Roslyn Coombe, MD  warfarin (COUMADIN ) 4 MG tablet TAKE 2 TABLETS BY MOUTH DAILY EXCEPT TAKE 1 TABLET ON MONDAYS, WEDNESDAYS AND FRIDAYS OR AS DIRECTED BY COAGULATION CLINIC 09/16/23   Roslyn Coombe, MD                                                                                                                                    Allergies Oxycodone  Review of Systems Review of Systems As noted in HPI  Physical Exam Vital Signs  I have reviewed the triage vital signs BP 138/63   Pulse 82   Temp 98.3 F (36.8 C)   Resp 20   Ht 6\' 4"  (1.93 m)   Wt 133.8 kg   SpO2 100%   BMI 35.91 kg/m   Physical Exam Vitals reviewed.  Constitutional:      General: He is not in acute distress.    Appearance: He is well-developed. He is not diaphoretic.  HENT:     Head: Normocephalic and atraumatic.     Nose: Nose normal.  Eyes:     General: No scleral icterus.       Right eye: No discharge.        Left eye: No discharge.     Conjunctiva/sclera: Conjunctivae normal.     Pupils: Pupils are equal, round, and reactive to light.  Cardiovascular:     Rate and Rhythm: Normal rate and regular rhythm.     Heart sounds: No murmur heard.    No friction rub. No gallop.  Pulmonary:     Effort: Pulmonary effort is normal. No respiratory distress.     Breath sounds: Normal breath sounds. No stridor. No rales.  Abdominal:     General: There is no distension.     Palpations: Abdomen is soft.     Tenderness: There is no abdominal tenderness.  Musculoskeletal:        General: No tenderness.     Cervical back: Normal range of motion and neck supple.  Skin:    General: Skin is warm and dry.  Findings: No  erythema or rash.       Neurological:     Mental Status: He is alert and oriented to person, place, and time.     ED Results and Treatments Labs (all labs ordered are listed, but only abnormal results are displayed) Labs Reviewed  BASIC METABOLIC PANEL WITH GFR - Abnormal; Notable for the following components:      Result Value   Sodium 133 (*)    Glucose, Bld 129 (*)    Creatinine, Ser 1.48 (*)    Calcium  8.3 (*)    GFR, Estimated 51 (*)    All other components within normal limits  CBC - Abnormal; Notable for the following components:   RBC 4.06 (*)    Hemoglobin 11.5 (*)    HCT 34.6 (*)    All other components within normal limits  PROTIME-INR - Abnormal; Notable for the following components:   Prothrombin Time 17.8 (*)    INR 1.4 (*)    All other components within normal limits  TROPONIN I (HIGH SENSITIVITY)  TROPONIN I (HIGH SENSITIVITY)                                                                                                                         EKG  EKG Interpretation Date/Time:  Thursday Oct 16 2023 05:41:31 EDT Ventricular Rate:  79 PR Interval:  242 QRS Duration:  110 QT Interval:  411 QTC Calculation: 472 R Axis:   56  Text Interpretation: Sinus rhythm Multiform ventricular premature complexes Prolonged PR interval Confirmed by Townsend Freud [phone]) on 10/16/2023 6:04:19 AM       Radiology DG Chest 2 View Result Date: 10/16/2023 CLINICAL DATA:  Tachycardia EXAM: CHEST - 2 VIEW COMPARISON:  09/08/2023 FINDINGS: The heart size and mediastinal contours are within normal limits. Both lungs are clear. The visualized skeletal structures are unremarkable. IMPRESSION: No active cardiopulmonary disease. Electronically Signed   By: Juanetta Nordmann M.D.   On: 10/16/2023 01:55    Medications Ordered in ED Medications - No data to display Procedures Procedures  (including critical care time) Medical Decision Making / ED Course   Medical Decision  Making Amount and/or Complexity of Data Reviewed Labs: ordered. Decision-making details documented in ED Course. Radiology: ordered and independent interpretation performed. Decision-making details documented in ED Course. ECG/medicine tests: ordered and independent interpretation performed. Decision-making details documented in ED Course.    Patient presents with diaphoresis and tachycardia.  Not resolved. Patient was not sure whether the rhythm was regular or irregular.  EKG here shows sinus rhythm with infrequent PVCs.  No significant interval changes.  No acute ischemic changes, dysrhythmias or blocks. Initial troponin negative.  Delta troponin negative.  Checks x-ray without evidence of pneumonia, pneumothorax, pulmonary edema pleural effusions.  CBC without leukocytosis.  Mild anemia. Metabolic panel without significant electrolyte derangements.  Mild renal insufficiency without AKI.   INR is subtherapeutic at 1.4.  Will obtain a CTA to rule out PE  as this is close to the presentation patient had with his prior PEs.  Patient care turned over to oncoming provider. Patient case and results discussed in detail; please see their note for further ED managment.        Final Clinical Impression(s) / ED Diagnoses Final diagnoses:  None    This chart was dictated using voice recognition software.  Despite best efforts to proofread,  errors can occur which can change the documentation meaning.    Lindle Rhea, MD 10/16/23 [phone]

## 2023-10-16 NOTE — Discharge Instructions (Addendum)
 No definite cause of your weakness was found today.  There is no evidence of blood clot on your lung CT.  There are nodules noted on your lung CT which will require follow-up CT scan at 3 to 6 months.  Please follow-up with your doctor for further follow-up. Please take your Coumadin  at the higher dose of 8 mg until you are followed up by the provider that manages your INR's and Coumadin .  Please make sure they know that your level was low today. Return to the emergency department if you are having any new or worsening symptoms

## 2023-10-16 NOTE — ED Provider Notes (Signed)
  Physical Exam  BP 138/63   Pulse 82   Temp 98.3 F (36.8 C)   Resp 20   Ht 1.93 m (6\' 4" )   Wt 133.8 kg   SpO2 100%   BMI 35.91 kg/m   Physical Exam  Procedures  Procedures  ED Course / MDM   Clinical Course as of 10/16/23 1649  Thu Oct 16, 2023  0809 CTA reviewed and interpreted with no evidence of pulmonary embolism, small noncalcified pulmonary nodule with largest measuring 6 mm with noncontrast CT at 3 to 25-month recommended [DR]    Clinical Course User Index [DR] Auston Blush, MD   Medical Decision Making Amount and/or Complexity of Data Reviewed Radiology: ordered.  Risk Prescription drug management.   68 yo male ho dvt and pe, came in secondary to diaphoresis and tachycardia c.w. prior pe. INR 1.4.  CTA chest pending. Patient continues to be stable here. CTA without evidence of PE.  Nodules noted.  Discussed all of the above with the patient and advised regarding return precautions and need for close follow-up and he and his wife voiced understanding        Auston Blush, MD 10/16/23 1649

## 2023-10-16 NOTE — Telephone Encounter (Signed)
 Copied from CRM [id number]. Topic: Clinical - Medical Advice >> Oct 16, 2023  9:11 AM Justin Woodard wrote: Reason for CRM: Patient called in needing to speak with someone in the coumadin  clinic. Did advise she was not in today and should return tomorrow. Please call [phone]

## 2023-10-16 NOTE — Telephone Encounter (Signed)
 Copied from CRM [id number]. Topic: General - Other >> Oct 16, 2023  1:34 PM Aisha D wrote: Reason for CRM: Patient called in needing to speak with someone in the coumadin  clinic. Did advise she was not in today and should return tomorrow. Please call [phone]  UPDATE: Pt is calling back to get an appt schedule for tomorrow for the coumadin  clinic. Pt stated that he needs to have a test done tomorrow for the hospital. I informed the pt that she was not in office today. Pt would like to know if he could have it done at the lab instead and would like to have a call back with an update on the request.

## 2023-10-20 ENCOUNTER — Ambulatory Visit (INDEPENDENT_AMBULATORY_CARE_PROVIDER_SITE_OTHER)

## 2023-10-20 ENCOUNTER — Encounter: Payer: Self-pay | Admitting: Internal Medicine

## 2023-10-20 ENCOUNTER — Ambulatory Visit (INDEPENDENT_AMBULATORY_CARE_PROVIDER_SITE_OTHER): Admitting: Internal Medicine

## 2023-10-20 VITALS — BP 120/72 | HR 90 | Temp 99.2°F | Ht 76.0 in | Wt 294.0 lb

## 2023-10-20 DIAGNOSIS — Z7901 Long term (current) use of anticoagulants: Secondary | ICD-10-CM | POA: Diagnosis not present

## 2023-10-20 DIAGNOSIS — M7989 Other specified soft tissue disorders: Secondary | ICD-10-CM | POA: Diagnosis not present

## 2023-10-20 DIAGNOSIS — I10 Essential (primary) hypertension: Secondary | ICD-10-CM

## 2023-10-20 DIAGNOSIS — M79669 Pain in unspecified lower leg: Secondary | ICD-10-CM

## 2023-10-20 DIAGNOSIS — F418 Other specified anxiety disorders: Secondary | ICD-10-CM

## 2023-10-20 DIAGNOSIS — E559 Vitamin D deficiency, unspecified: Secondary | ICD-10-CM | POA: Diagnosis not present

## 2023-10-20 LAB — POCT INR: INR: 1.9 — AB (ref 2.0–3.0)

## 2023-10-20 NOTE — Assessment & Plan Note (Signed)
 Last vitamin D  Lab Results  Component Value Date   VD25OH 30.53 09/16/2023   Low, to start oral replacement

## 2023-10-20 NOTE — Patient Instructions (Signed)
 Please continue all other medications as before, including the coumadin  adjusted today  Please have the pharmacy call with any other refills you may need..  Please keep your appointments with your specialists as you may have planned  You will be contacted regarding the referral for: Venous Dopplers for both legs  If the ultrasound is abnormal for DVT, we may need to start lovenox until the coumadin  is proven effective  Please call if you change your mind about starting klonopin or celexa for nerves

## 2023-10-20 NOTE — Progress Notes (Signed)
 Patient ID: Justin Woodard, male   DOB: 06/26/1955, 68 y.o.   MRN: [id number]        Chief Complaint: follow up 3 days onset phlebitis to bialterall upper legs, chronic anticoagulation, htn       HPI:  Justin Woodard is a 68 y.o. male here with chronic anticoagulation on coumadin  with recent INR < 2 with last one today 1.9, now with several days onset bilateral upper leg medial thigh phlebitis large areas and tender, mild redness and swelling.  LE both seem more swelling than usual as well.  Has hx of dvt, pe.  Pt denies chest pain, increased sob or doe, wheezing, orthopnea, PND, increased LE swelling, palpitations, dizziness or syncope.   Pt denies polydipsia, polyuria, or new focal neuro s/s.   Coumadin  increased today, with plan to f/u inr soon.  Recent CTA chest neg for PE 5/29.  Also has ongoing anxiety but not sure if wants to try med such as klonopin for panic or celexa.  Denies worsening depressive symptoms, suicidal ideation, but has occasional panic.       Wt Readings from Last 3 Encounters:  10/20/23 294 lb (133.4 kg)  10/16/23 294 lb 15.6 oz (133.8 kg)  10/14/23 295 lb (133.8 kg)   BP Readings from Last 3 Encounters:  10/20/23 120/72  10/16/23 (!) 128/91  10/14/23 120/70         Past Medical History:  Diagnosis Date   A-fib [facility])    post PE only   Anxiety state 04/20/2007   Qualifier: Diagnosis of  By: Autry Legions MD, Alveda Aures    DVT (deep venous thrombosis) (HCC)    2000   Dysphagia 09/13/2016   Erectile dysfunction 05/28/2011   Eustachian tube dysfunction, right 06/25/2017   GERD (gastroesophageal reflux disease)    Hypertension    Long term current use of anticoagulant 07/03/2010   Pulmonary embolism (HCC)    Radiculitis of right cervical region 09/13/2016   Right knee pain 06/25/2017   Shingles 02/06/2012   Symptomatic PVCs    Past Surgical History:  Procedure Laterality Date   BREAST SURGERY     TONSILLECTOMY     VEIN SURGERY     RLE saphenous vein stripping    reports that  he has quit smoking. He has never used smokeless tobacco. He reports that he does not drink alcohol and does not use drugs. family history includes Diabetes in an other family member; Heart attack in his father. Allergies  Allergen Reactions   Oxycodone Other (See Comments)    "Just made me feel bad"   Current Outpatient Medications on File Prior to Visit  Medication Sig Dispense Refill   metoprolol  succinate (TOPROL -XL) 50 MG 24 hr tablet Take 0.5 tablets (25 mg total) by mouth daily. Take with or immediately following a meal. 45 tablet 3   rosuvastatin  (CRESTOR ) 20 MG tablet Take 1 tablet (20 mg total) by mouth daily. 90 tablet 3   warfarin (COUMADIN ) 4 MG tablet TAKE 2 TABLETS BY MOUTH DAILY EXCEPT TAKE 1 TABLET ON MONDAYS, WEDNESDAYS AND FRIDAYS OR AS DIRECTED BY COAGULATION CLINIC 165 tablet 1   Azelastine HCl 137 MCG/SPRAY SOLN Place into both nostrils. (Patient not taking: Reported on 10/20/2023)     cefdinir (OMNICEF) 300 MG capsule Take 300 mg by mouth 2 (two) times daily. (Patient not taking: Reported on 10/20/2023)     Cholecalciferol (VITAMIN D3) 50 MCG (2000 UT) capsule Take 2 capsules (4,000 Units total) by mouth  daily. (Patient not taking: Reported on 10/20/2023) 99 capsule 99   HYDROcodone  bit-homatropine (HYCODAN) 5-1.5 MG/5ML syrup Take 5 mLs by mouth every 6 (six) hours as needed for up to 10 days. (Patient not taking: Reported on 10/20/2023) 180 mL 0   losartan  (COZAAR ) 100 MG tablet Take 1 tablet (100 mg total) by mouth daily. 90 tablet 3   omeprazole  (PRILOSEC) 40 MG capsule Take 1 capsule 40 mg by mouth daily before breakfast. (Patient not taking: Reported on 10/20/2023) 90 capsule 3   triamcinolone  (NASACORT ) 55 MCG/ACT AERO nasal inhaler Place 2 sprays into the nose daily. (Patient not taking: Reported on 10/20/2023) 1 each 12   No current facility-administered medications on file prior to visit.        ROS:  All others reviewed and negative.  Objective        PE:  BP 120/72  (BP Location: Right Arm, Patient Position: Sitting, Cuff Size: Normal)   Pulse 90   Temp 99.2 F (37.3 C) (Oral)   Ht 6\' 4"  (1.93 m)   Wt 294 lb (133.4 kg)   SpO2 98%   BMI 35.79 kg/m                 Constitutional: Pt appears in NAD               HENT: Head: NCAT.                Right Ear: External ear normal.                 Left Ear: External ear normal.                Eyes: . Pupils are equal, round, and reactive to light. Conjunctivae and EOM are normal               Nose: without d/c or deformity               Neck: Neck supple. Gross normal ROM               Cardiovascular: Normal rate and regular rhythm.                 Pulmonary/Chest: Effort normal and breath sounds without rales or wheezing.                Abd:  Soft, NT, ND, + BS, no organomegaly               Neurological: Pt is alert. At baseline orientation, motor grossly intact               Skin:  LE edema - 1-2+ bilateral, has extensive area phlebitis superficial noted to medial thighs left > right               Psychiatric: Pt behavior is normal without agitation   Micro: none  Cardiac tracings I have personally interpreted today:  none  Pertinent Radiological findings (summarize): none   Lab Results  Component Value Date   WBC 6.2 10/16/2023   HGB 11.5 (L) 10/16/2023   HCT 34.6 (L) 10/16/2023   PLT 187 10/16/2023   GLUCOSE 129 (H) 10/16/2023   CHOL 116 09/16/2023   TRIG 193.0 (H) 09/16/2023   HDL 37.00 (L) 09/16/2023   LDLDIRECT 124.0 09/10/2021   LDLCALC 41 09/16/2023   ALT 18 09/16/2023   AST 17 09/16/2023   NA 133 (L) 10/16/2023   K 4.5 10/16/2023   CL  100 10/16/2023   CREATININE 1.48 (H) 10/16/2023   BUN 15 10/16/2023   CO2 23 10/16/2023   TSH 3.83 09/16/2023   PSA 2.54 09/16/2023   INR 1.9 (A) 10/20/2023   HGBA1C 6.0 09/16/2023   MICROALBUR <0.7 09/16/2023   Assessment/Plan:  Justin Woodard is a 68 y.o. White or Caucasian [1] male with  has a past medical history of A-fib [facility]),  Anxiety state (04/20/2007), DVT (deep venous thrombosis) (HCC), Dysphagia (09/13/2016), Erectile dysfunction (05/28/2011), Eustachian tube dysfunction, right (06/25/2017), GERD (gastroesophageal reflux disease), Hypertension, Long term current use of anticoagulant (07/03/2010), Pulmonary embolism (HCC), Radiculitis of right cervical region (09/13/2016), Right knee pain (06/25/2017), Shingles (02/06/2012), and Symptomatic PVCs.  Pain and swelling of lower leg Has bilateral areas marked new tender phlebitis, and worsening leg swelling can't r/o communicating clot with DVT as well; most likely occurred with recent under anticoagulation; coumadin  has been increased and plan for f/u INR, but will need urgent bilateral LE venous dopplers - if has DVT will need bridging lovenox as well  Long term current use of anticoagulant D/w pt option to change to eliquis 5 mg bid preventive, but will need acute issue resolved first  Hypertension, uncontrolled BP Readings from Last 3 Encounters:  10/20/23 120/72  10/16/23 (!) 128/91  10/14/23 120/70   Stable, pt to continue medical treatment toprol  xl 25 qd    Vitamin D  deficiency Last vitamin D  Lab Results  Component Value Date   VD25OH 30.53 09/16/2023   Low, to start oral replacement   Depression with anxiety D/w pt, decliens klonopin or celexa for now but will consider and let us  know  Followup: Return if symptoms worsen or fail to improve.  Rosalia Colonel, MD 10/20/2023 8:13 PM [facility] - [facility]

## 2023-10-20 NOTE — Assessment & Plan Note (Signed)
 BP Readings from Last 3 Encounters:  10/20/23 120/72  10/16/23 (!) 128/91  10/14/23 120/70   Stable, pt to continue medical treatment toprol  xl 25 qd

## 2023-10-20 NOTE — Telephone Encounter (Addendum)
 This nurse was out of the office all last week.   Pt called this morning requesting INR check. Scheduled at [facility] for coumadin  clinic. Provided address to [facility] location. Pt reports he was advised in d/c paperwork to take 8 mg daily until INR recheck. Pt verbalized understanding.   Pt reported he also has a PCP hospital f/u today.

## 2023-10-20 NOTE — Progress Notes (Signed)
 Pt in ER on 5/29 for weakness and tachycardia. INR in ER was 1.4. They advised pt have INR check as soon as available. Pt reports they advised he take 8 mg daily until recheck. Pt denies any other changes. Pt also has PCP apt today. Increase dose to take 1 1/2 tablets today and then continue 2 tablets daily except take 1 tablet on Mondays, Wednesdays and Fridays. Recheck in 1 weeks.

## 2023-10-20 NOTE — Assessment & Plan Note (Signed)
 Has bilateral areas marked new tender phlebitis, and worsening leg swelling can't r/o communicating clot with DVT as well; most likely occurred with recent under anticoagulation; coumadin  has been increased and plan for f/u INR, but will need urgent bilateral LE venous dopplers - if has DVT will need bridging lovenox as well

## 2023-10-20 NOTE — Assessment & Plan Note (Signed)
 D/w pt, decliens klonopin or celexa for now but will consider and let us  know

## 2023-10-20 NOTE — Assessment & Plan Note (Signed)
 D/w pt option to change to eliquis 5 mg bid preventive, but will need acute issue resolved first

## 2023-10-20 NOTE — Telephone Encounter (Signed)
 Talked to pt this morning. He is scheduled for coumadin  clinic today at [facility].

## 2023-10-20 NOTE — Patient Instructions (Addendum)
 Pre visit review using our clinic review tool, if applicable. No additional management support is needed unless otherwise documented below in the visit note.  Increase dose to take 1 1/2 tablets today and then continue 2 tablets daily except take 1 tablet on Mondays, Wednesdays and Fridays. Recheck in 1 weeks.

## 2023-10-22 ENCOUNTER — Ambulatory Visit: Payer: Self-pay | Admitting: Internal Medicine

## 2023-10-22 ENCOUNTER — Ambulatory Visit (HOSPITAL_COMMUNITY)
Admission: RE | Admit: 2023-10-22 | Discharge: 2023-10-22 | Disposition: A | Discharge status: Home / Self Care | Source: Ambulatory Visit | Attending: Internal Medicine | Admitting: Internal Medicine

## 2023-10-22 DIAGNOSIS — M7989 Other specified soft tissue disorders: Secondary | ICD-10-CM | POA: Diagnosis not present

## 2023-10-22 DIAGNOSIS — M79669 Pain in unspecified lower leg: Secondary | ICD-10-CM

## 2023-10-22 IMAGING — DX DG CHEST 2V
2 series · 2 of 2 positions shown · non-contrast
Comparison: 03/28/2020

CLINICAL DATA: Shortness of breath

EXAM:
CHEST - 2 VIEW

[chest pa]
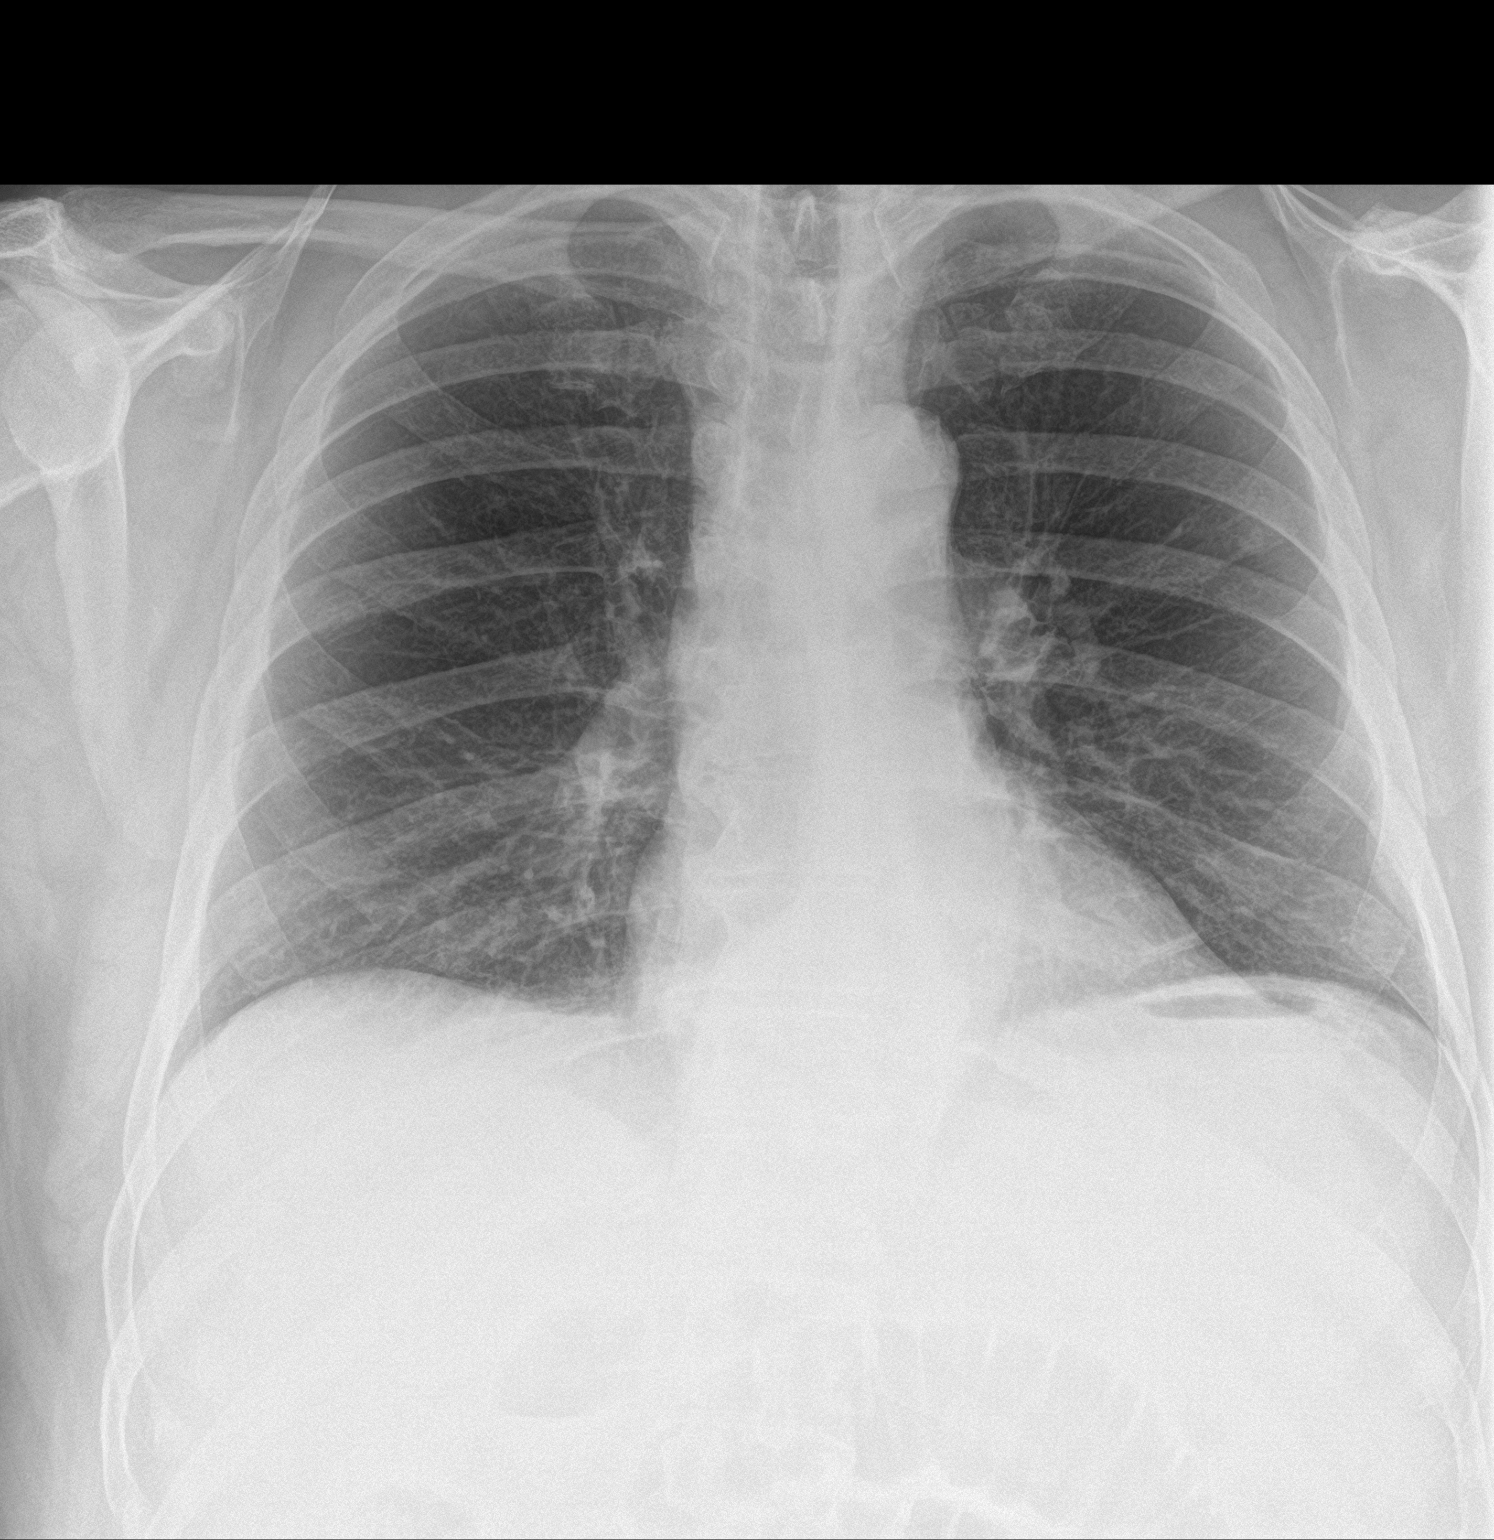

[chest lat]
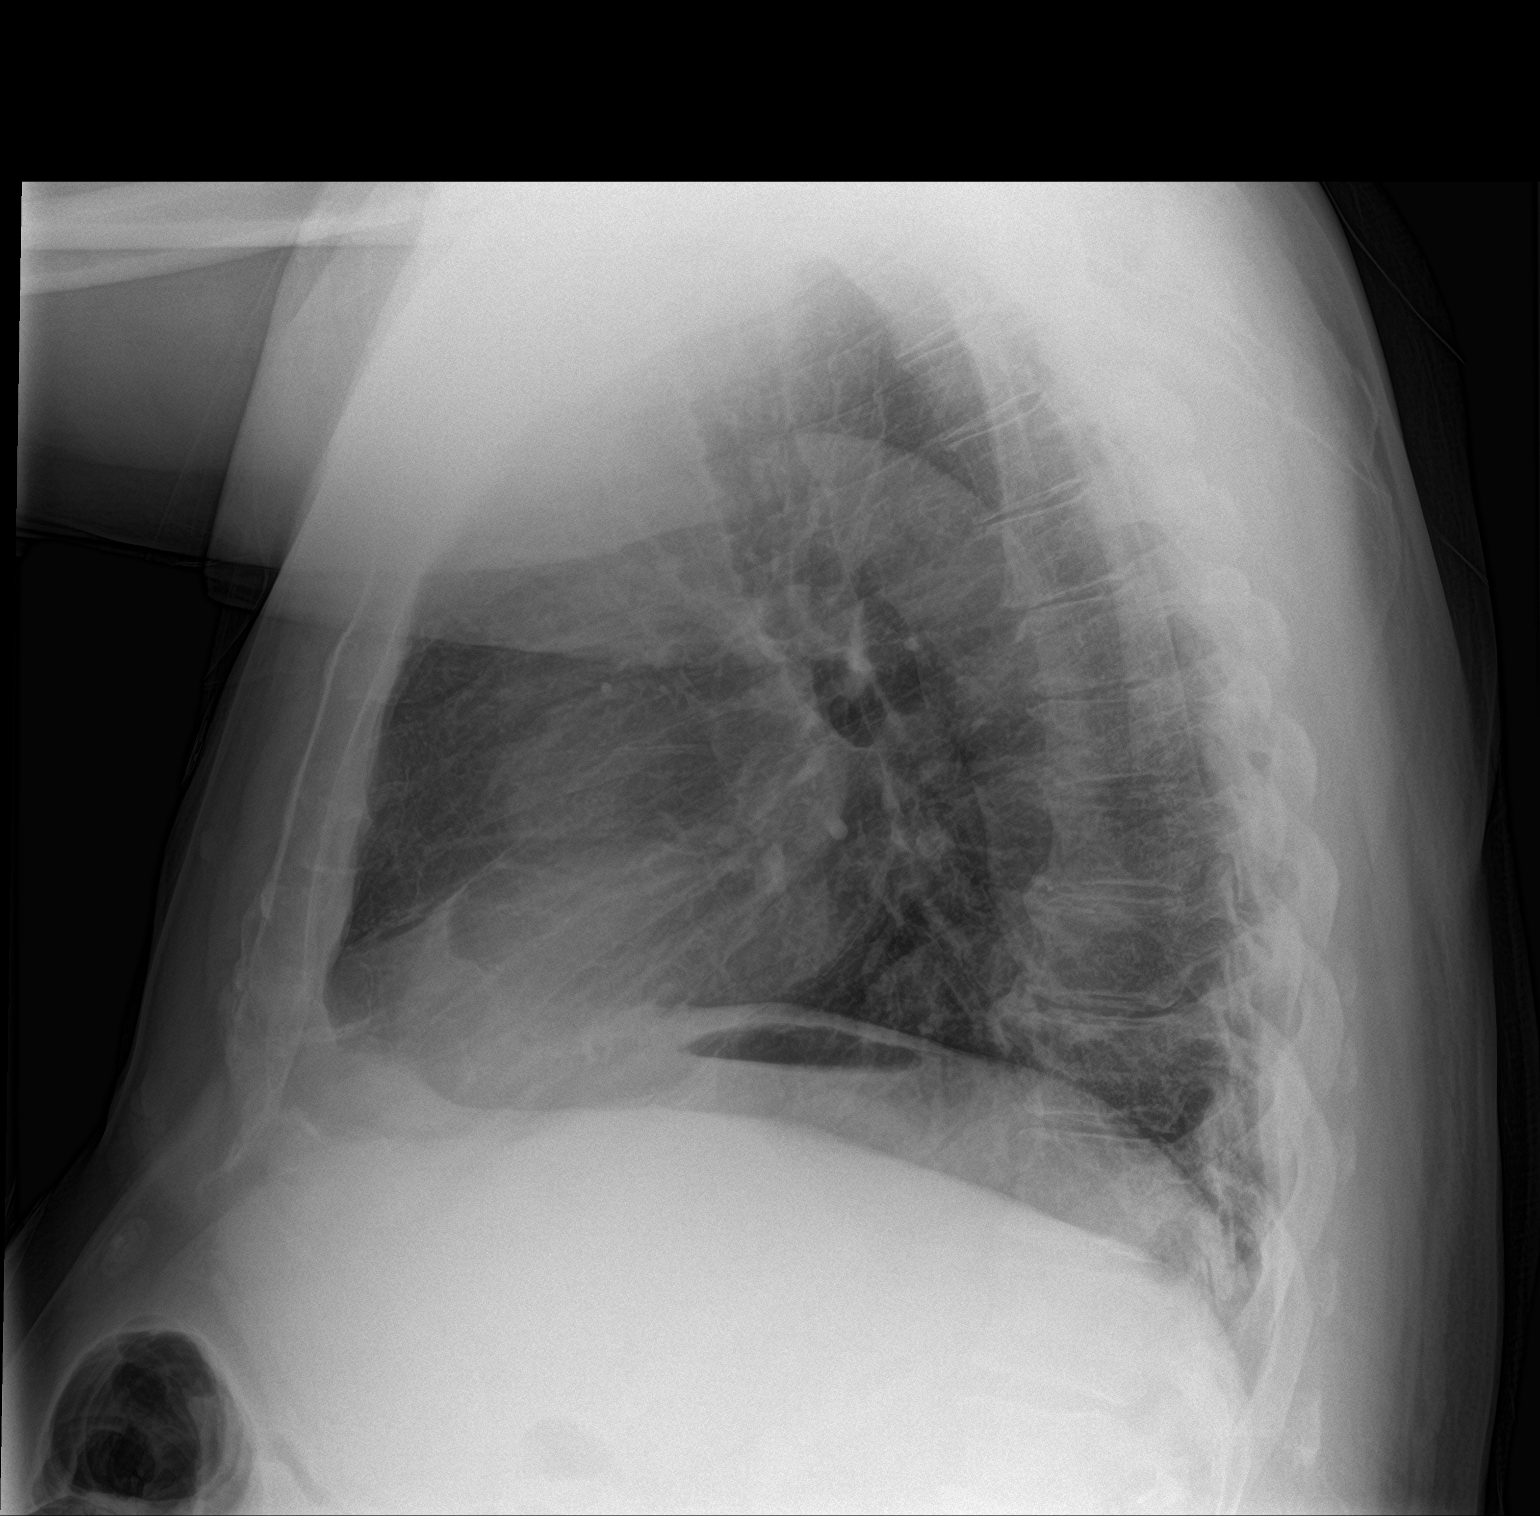

[2 of 2 positions shown; findings below may reference images not displayed]

FINDINGS: The heart size and mediastinal contours are within normal limits.
Both lungs are clear. Degenerative changes of the spine.
IMPRESSION: No active cardiopulmonary disease.

## 2023-10-22 IMAGING — CT CT ABD-PELV W/ CM
2 of 5 series · 16 of 46 positions shown, 18 images · IV contrast (omnipaque)
Comparison: CT abdomen and pelvis 03/28/2020.

CLINICAL DATA: Abdominal distension.

EXAM:
CT ABDOMEN AND PELVIS WITH CONTRAST
TECHNIQUE: Multidetector CT imaging of the abdomen and pelvis was performed
using the standard protocol following bolus administration of
intravenous contrast.
CONTRAST:  100mL OMNIPAQUE IOHEXOL 300 MG/ML  SOLN

[Series 5: abd/ pelvis 5.0 i30f 2 · axial · 0.96mm/px · z∈[+1356,+1801]mm · 13 of 101 slices shown, 15 images]
[im 6/101  soft-tissue]
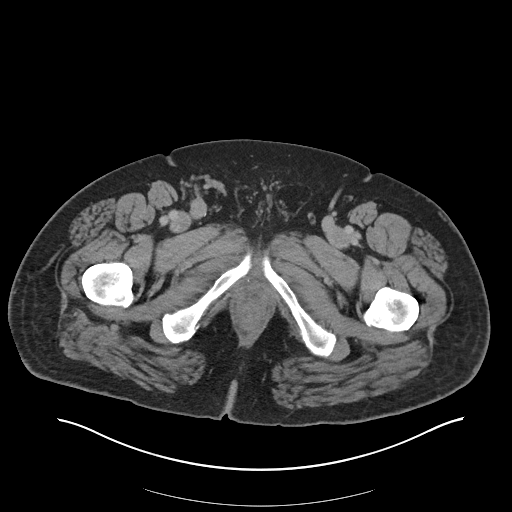
[im 6/101  bone]
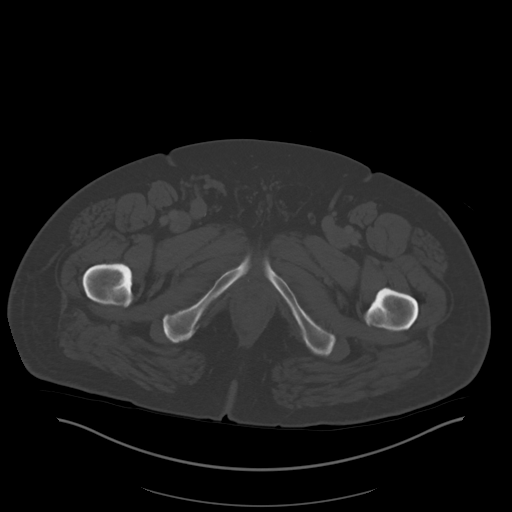
[im 16/101  soft-tissue]
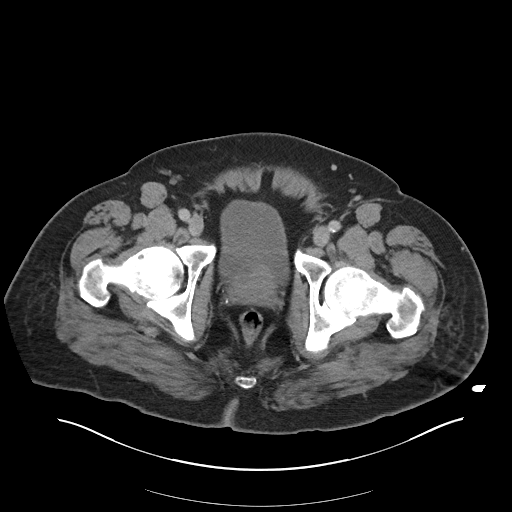
[im 22/101  soft-tissue]
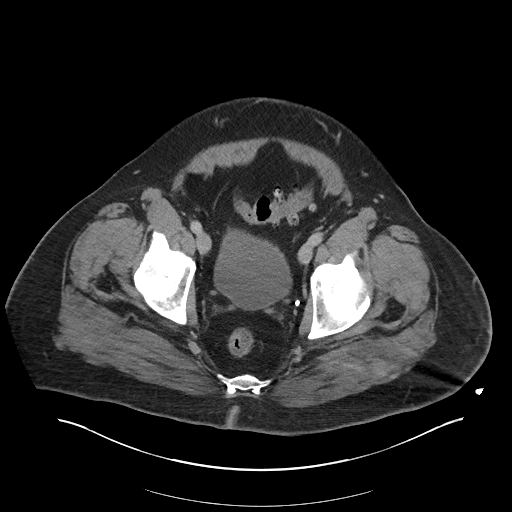
[im 27/101  soft-tissue]
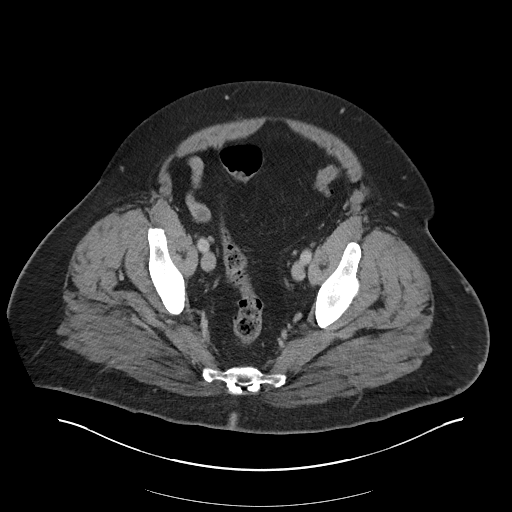
[im 37/101  soft-tissue]
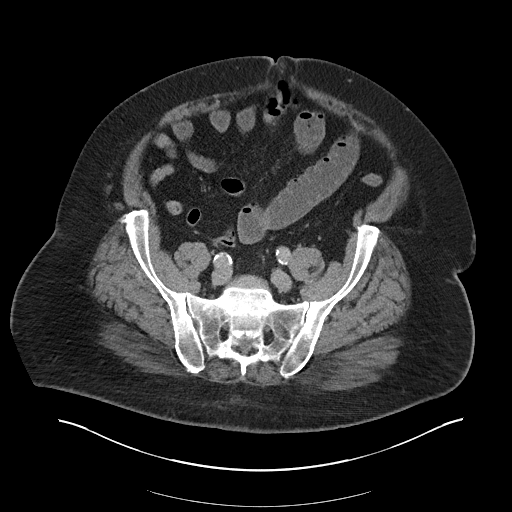
[im 43/101  soft-tissue]
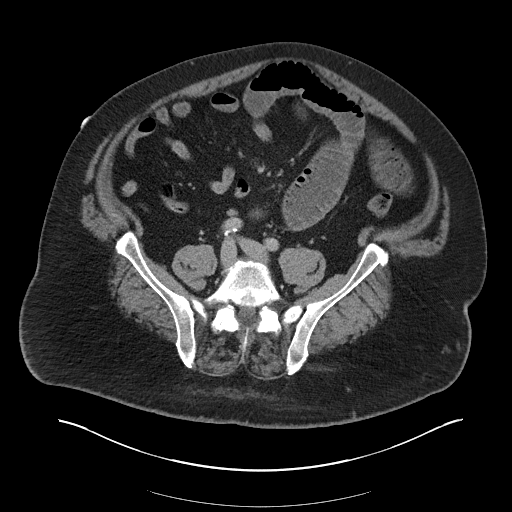
[im 53/101  soft-tissue]
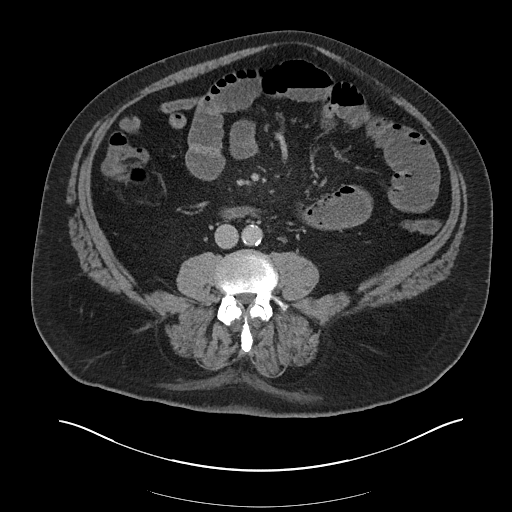
[im 58/101  soft-tissue]
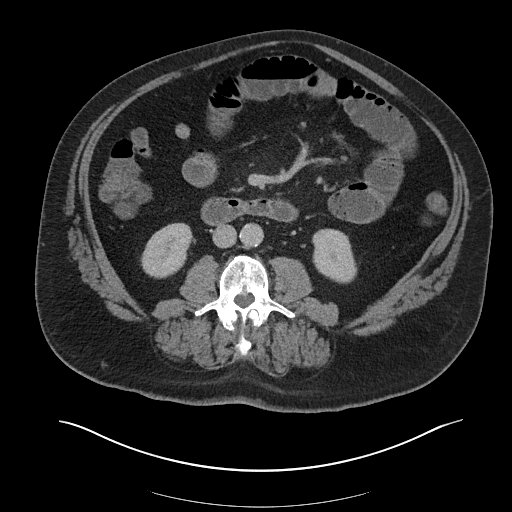
[im 64/101  soft-tissue]
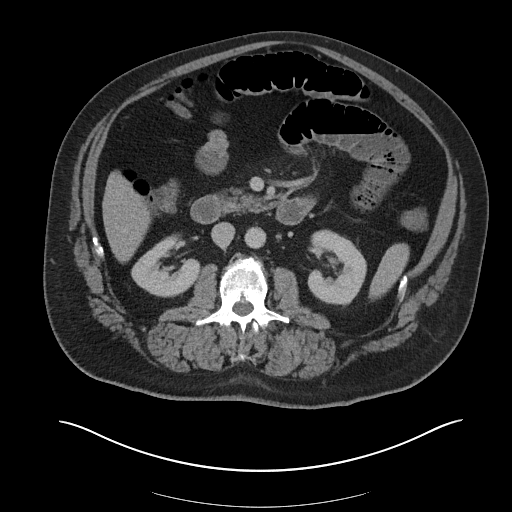
[im 64/101  bone]
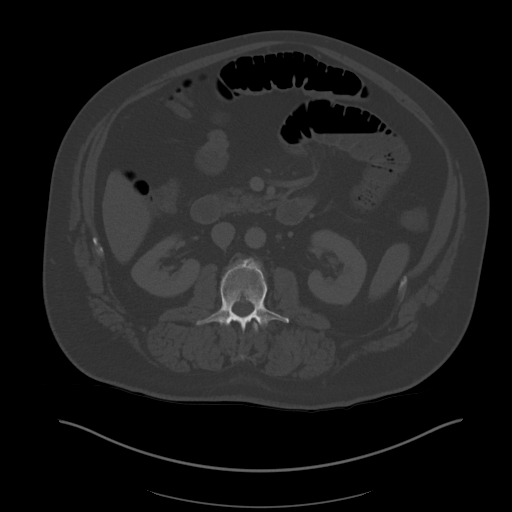
[im 74/101  soft-tissue]
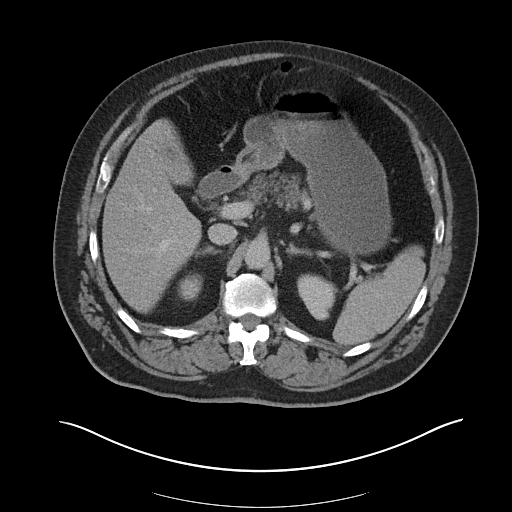
[im 79/101  soft-tissue]
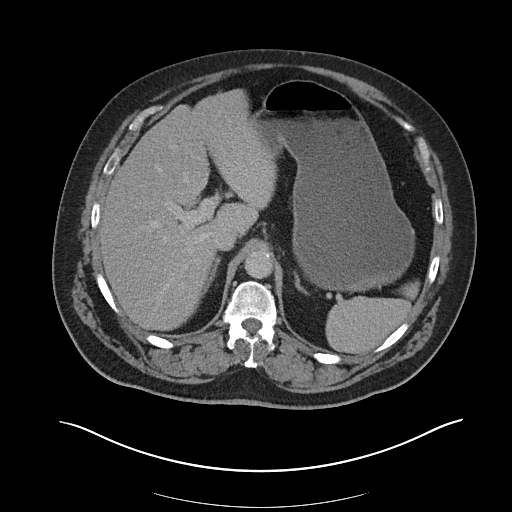
[im 85/101  soft-tissue]
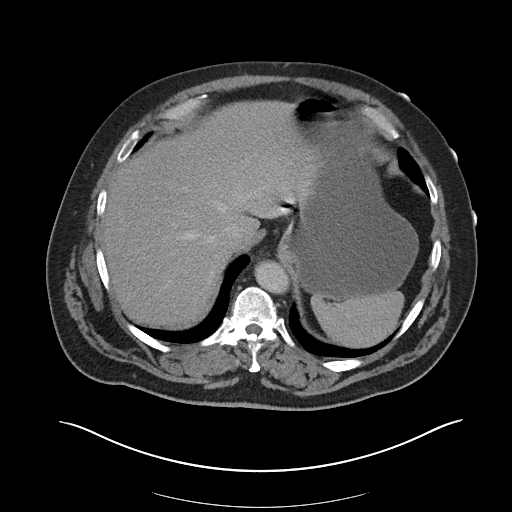
[im 95/101  soft-tissue]
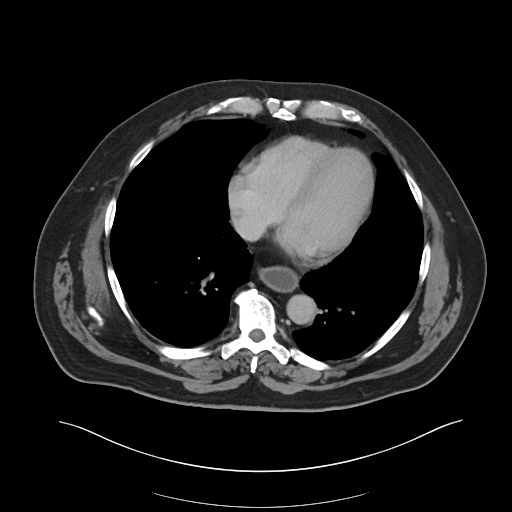

[Series 8: coronal soft tissue · coronal · 0.88mm/px · 3 of 138 slices shown]
[im 46/138  soft-tissue]
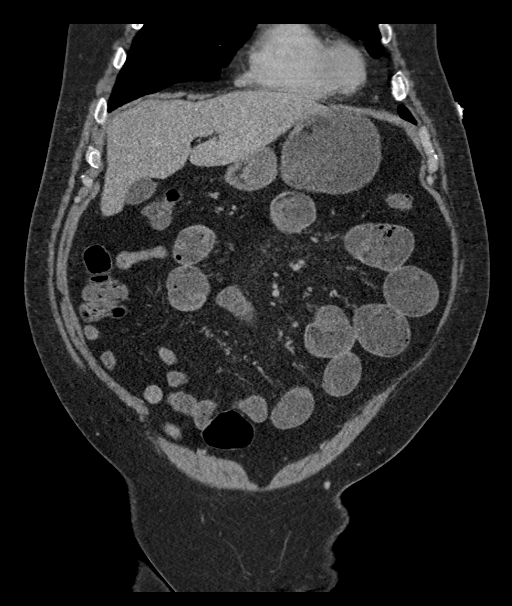
[im 61/138  soft-tissue]
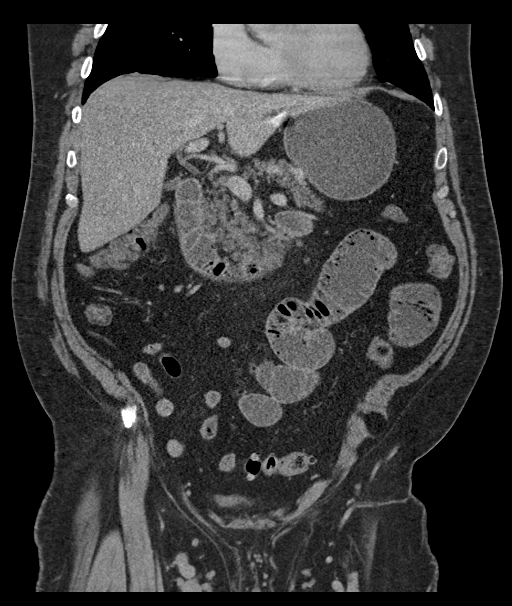
[im 77/138  soft-tissue]
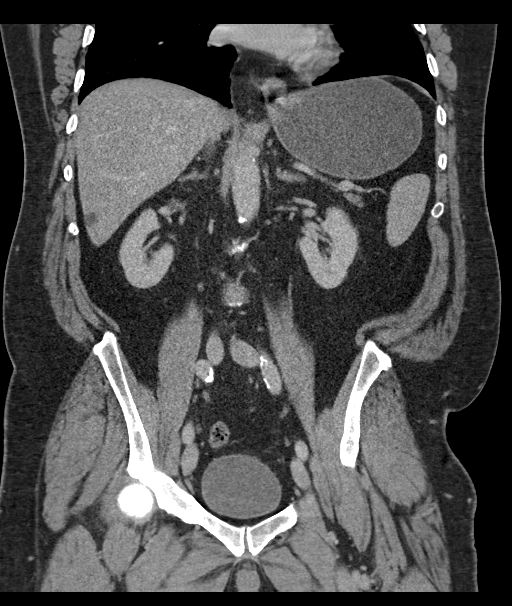

[16 of 46 positions shown; findings below may reference images not displayed]

FINDINGS: Lower chest: No acute abnormality.

Hepatobiliary: No focal liver abnormality is seen. No gallstones,
gallbladder wall thickening, there is a stable hypodensity in the
right lobe of the liver measuring 2.1 x 1.1 cm, likely a cyst. The
liver, gallbladder and bile ducts are otherwise within normal
limits. 1 cm

Pancreas: Unremarkable. No pancreatic ductal dilatation or
surrounding inflammatory changes.

Spleen: Normal in size without focal abnormality.

Adrenals/Urinary Tract: There is a rounded hypodensity in the left
kidney which is too small to characterize likely a cyst. The adrenal
glands, kidneys, and bladder are otherwise within normal limits.

Stomach/Bowel: Stomach and proximal small bowel loops are dilated in
fluid distended. Small bowel loops measure up to 4.5 cm in diameter.
Transition point seen adjacent to the anterior abdominal wall the
level of umbilical hernia. Distal small bowel is decompressed. There
is no mesenteric edema, pneumatosis or free air. There is fluid in
the distal esophagus, likely reflux. The appendix is within normal
limits. There is sigmoid colon diverticulosis without evidence for
acute diverticulitis.

Vascular/Lymphatic: Aortic atherosclerosis. No enlarged abdominal or
pelvic lymph nodes. Again seen are numerous varicosities in the
saphenofemoral junctions bilaterally, unchanged from prior.

Reproductive: Prostate is unremarkable.

Other: No abdominal wall hernia or abnormality. There is no ascites.
There is a small fat containing umbilical hernia.

Musculoskeletal: Multilevel degenerative changes affect the spine.
IMPRESSION: 1. Small-bowel obstruction. Transition point seen adjacent to the
anterior abdominal wall adjacent to fat containing umbilical hernia.
Findings may be secondary to adhesions.
2. Reflux into the distal esophagus.
3. Colonic diverticulosis.
4. Stable varicosities in the bilateral saphenofemoral junctions
likely related to reflux and lower extremity venous insufficiency.
5.  Aortic Atherosclerosis ([id number]).

## 2023-10-28 ENCOUNTER — Ambulatory Visit (INDEPENDENT_AMBULATORY_CARE_PROVIDER_SITE_OTHER)

## 2023-10-28 ENCOUNTER — Ambulatory Visit: Admitting: Gastroenterology

## 2023-10-28 DIAGNOSIS — Z7901 Long term (current) use of anticoagulants: Secondary | ICD-10-CM

## 2023-10-28 LAB — POCT INR: INR: 2.1 (ref 2.0–3.0)

## 2023-10-28 NOTE — Progress Notes (Signed)
 Pt has been subtherapeutic the last 2 INR results and is at the bottom of his INR range today. Would like INR closer to goal of 2.5. Will make a small weekly dose change and recheck in one week.  Change weekly dose to take 2 tablets daily except take 1 tablet on Mondays and Fridays. Recheck in 1 weeks.

## 2023-10-28 NOTE — Patient Instructions (Addendum)
 Pre visit review using our clinic review tool, if applicable. No additional management support is needed unless otherwise documented below in the visit note.  Change weekly dose to take 2 tablets daily except take 1 tablet on Mondays and Fridays. Recheck in 1 weeks.

## 2023-10-29 ENCOUNTER — Ambulatory Visit: Admitting: Gastroenterology

## 2023-11-04 ENCOUNTER — Ambulatory Visit (INDEPENDENT_AMBULATORY_CARE_PROVIDER_SITE_OTHER)

## 2023-11-04 DIAGNOSIS — Z7901 Long term (current) use of anticoagulants: Secondary | ICD-10-CM | POA: Diagnosis not present

## 2023-11-04 LAB — POCT INR: INR: 2.3 (ref 2.0–3.0)

## 2023-11-04 NOTE — Patient Instructions (Addendum)
 Pre visit review using our clinic review tool, if applicable. No additional management support is needed unless otherwise documented below in the visit note.  Continue  2 tablets daily except take 1 tablet on Mondays and Fridays. Recheck in 3 weeks.

## 2023-11-04 NOTE — Progress Notes (Signed)
 Continue  2 tablets daily except take 1 tablet on Mondays and Fridays. Recheck in 3 weeks.

## 2023-11-06 ENCOUNTER — Ambulatory Visit: Admitting: Internal Medicine

## 2023-11-22 ENCOUNTER — Other Ambulatory Visit: Payer: Self-pay | Admitting: Internal Medicine

## 2023-11-22 DIAGNOSIS — Z7901 Long term (current) use of anticoagulants: Secondary | ICD-10-CM

## 2023-11-25 NOTE — Telephone Encounter (Signed)
 Pt is compliant with warfarin management and PCP apts.  Sent in refill of warfarin to requested pharmacy.

## 2023-11-26 DIAGNOSIS — H2513 Age-related nuclear cataract, bilateral: Secondary | ICD-10-CM | POA: Diagnosis not present

## 2023-11-26 DIAGNOSIS — H04123 Dry eye syndrome of bilateral lacrimal glands: Secondary | ICD-10-CM | POA: Diagnosis not present

## 2023-11-26 DIAGNOSIS — H53143 Visual discomfort, bilateral: Secondary | ICD-10-CM | POA: Diagnosis not present

## 2023-11-26 DIAGNOSIS — H43393 Other vitreous opacities, bilateral: Secondary | ICD-10-CM | POA: Diagnosis not present

## 2023-11-28 ENCOUNTER — Ambulatory Visit (INDEPENDENT_AMBULATORY_CARE_PROVIDER_SITE_OTHER)

## 2023-11-28 DIAGNOSIS — Z7901 Long term (current) use of anticoagulants: Secondary | ICD-10-CM | POA: Diagnosis not present

## 2023-11-28 LAB — POCT INR: INR: 2.5 (ref 2.0–3.0)

## 2023-11-28 NOTE — Progress Notes (Signed)
 Continue  2 tablets daily except take 1 tablet on Mondays and Fridays. Recheck in 4 weeks.

## 2023-11-28 NOTE — Patient Instructions (Addendum)
 Pre visit review using our clinic review tool, if applicable. No additional management support is needed unless otherwise documented below in the visit note.  Continue  2 tablets daily except take 1 tablet on Mondays and Fridays. Recheck in 4 weeks.

## 2023-12-26 ENCOUNTER — Ambulatory Visit

## 2023-12-26 DIAGNOSIS — Z7901 Long term (current) use of anticoagulants: Secondary | ICD-10-CM | POA: Diagnosis not present

## 2023-12-26 LAB — POCT INR: INR: 2.6 (ref 2.0–3.0)

## 2023-12-26 NOTE — Patient Instructions (Addendum)
 Pre visit review using our clinic review tool, if applicable. No additional management support is needed unless otherwise documented below in the visit note.  Continue  2 tablets daily except take 1 tablet on Mondays and Fridays. Recheck in 5 weeks.

## 2023-12-26 NOTE — Progress Notes (Signed)
 Continue  2 tablets daily except take 1 tablet on Mondays and Fridays. Recheck in 5 weeks.

## 2024-01-08 ENCOUNTER — Encounter (HOSPITAL_BASED_OUTPATIENT_CLINIC_OR_DEPARTMENT_OTHER): Payer: Self-pay

## 2024-01-08 ENCOUNTER — Ambulatory Visit (HOSPITAL_BASED_OUTPATIENT_CLINIC_OR_DEPARTMENT_OTHER)
Admission: EM | Admit: 2024-01-08 | Discharge: 2024-01-08 | Disposition: A | Discharge status: Home / Self Care | Attending: Family Medicine | Admitting: Family Medicine

## 2024-01-08 DIAGNOSIS — R051 Acute cough: Secondary | ICD-10-CM | POA: Diagnosis not present

## 2024-01-08 LAB — POC SOFIA SARS ANTIGEN FIA: SARS Coronavirus 2 Ag: NEGATIVE

## 2024-01-08 NOTE — Discharge Instructions (Signed)
 Your COVID test was negative.  Your EKG did show some extra heartbeats but this seems to be normal with prior EKGs. Some your symptoms could be related to your acid reflux.  Otherwise you may have picked up some sort of virus.  Recommend over-the-counter medications for symptoms as needed.  Follow-up as needed For worsening chest pain or other problems he will need to go to the ER.

## 2024-01-08 NOTE — ED Provider Notes (Signed)
 [facility]    CSN: [id number] Arrival date & time: 01/08/24  9187      History   Chief Complaint Chief Complaint  Patient presents with   Gastroesophageal Reflux   Chest Pain   Diarrhea    HPI Justin Woodard is a 68 y.o. male.   Pt is a 68 year old male that presents with left chest pain, cough, body aches. Indigestion started last night, difficulty burping and feels like throat is swollen, had diarrhea last night. Reports not eating new or unusual food before episode. Hx of GERD. Patient took a tums with no relief. Patient had an entire sleeve of girl scout cookies before episode. Patient denies any radiation of pain outside of chest and had 2 episodes of diarrhea last night. Experienced sweating during episode. Patient was sweaty and clammy last night during episode. Patient says pain has subsided some this morning. Did go to a funeral yesterday.    Gastroesophageal Reflux Associated symptoms include chest pain.  Chest Pain Diarrhea   Past Medical History:  Diagnosis Date   A-fib [facility])    post PE only   Anxiety state 04/20/2007   Qualifier: Diagnosis of  By: Norleen MD, Lynwood ORN    DVT (deep venous thrombosis) (HCC)    2000   Dysphagia 09/13/2016   Erectile dysfunction 05/28/2011   Eustachian tube dysfunction, right 06/25/2017   GERD (gastroesophageal reflux disease)    Hypertension    Long term current use of anticoagulant 07/03/2010   Pulmonary embolism (HCC)    Radiculitis of right cervical region 09/13/2016   Right knee pain 06/25/2017   Shingles 02/06/2012   Symptomatic PVCs     Patient Active Problem List   Diagnosis Date Noted   Pain and swelling of lower leg 10/20/2023   Chronic pain of both shoulders 09/20/2023   Nocturia 03/13/2022   Aortic atherosclerosis (HCC) 09/07/2020   HLD (hyperlipidemia) 02/18/2020   B12 deficiency 02/18/2020   Vitamin D  deficiency 02/18/2020   Dizziness 08/13/2019   Migraine 07/29/2019   Near syncope 12/11/2018   Cough  01/26/2018   Right knee pain 06/25/2017   Eustachian tube dysfunction, right 06/25/2017   Allergic rhinitis 06/25/2017   Sinusitis 11/29/2016   Dysphagia 09/13/2016   Radiculitis of right cervical region 09/13/2016   Hyperglycemia 08/08/2015   Encounter for therapeutic drug monitoring 06/22/2013   Shingles 02/06/2012   Erectile dysfunction 05/28/2011   Encounter for well adult exam with abnormal findings 05/25/2011   Long term current use of anticoagulant 07/03/2010   Depression with anxiety 04/20/2007   Hypertension, uncontrolled 04/20/2007   PE 04/20/2007   ATRIAL FIBRILLATION 04/20/2007   PREMATURE VENTRICULAR CONTRACTIONS 04/20/2007   GERD 04/20/2007   CHEST PAIN 04/20/2007   Personal history of venous thrombosis and embolism 04/20/2007   DEEP VENOUS THROMBOPHLEBITIS, HX OF 04/20/2007    Past Surgical History:  Procedure Laterality Date   BREAST SURGERY     TONSILLECTOMY     VEIN SURGERY     RLE saphenous vein stripping       Home Medications    Prior to Admission medications   Medication Sig Start Date End Date Taking? Authorizing Provider  Azelastine HCl 137 MCG/SPRAY SOLN Place into both nostrils. Patient not taking: Reported on 10/20/2023 10/13/23   [provider]  cefdinir (OMNICEF) 300 MG capsule Take 300 mg by mouth 2 (two) times daily. Patient not taking: Reported on 10/20/2023 10/13/23   [provider]  Cholecalciferol (VITAMIN D3)  50 MCG (2000 UT) capsule Take 2 capsules (4,000 Units total) by mouth daily. Patient not taking: Reported on 10/20/2023 03/13/22   Norleen Lynwood ORN, MD  losartan  (COZAAR ) 100 MG tablet Take 1 tablet (100 mg total) by mouth daily. 04/16/23 07/15/23  Waddell Danelle ORN, MD  metoprolol  succinate (TOPROL -XL) 50 MG 24 hr tablet Take 0.5 tablets (25 mg total) by mouth daily. Take with or immediately following a meal. 09/16/23   Norleen Lynwood ORN, MD  omeprazole  (PRILOSEC) 40 MG capsule Take 1 capsule 40 mg by mouth daily before  breakfast. Patient not taking: Reported on 10/20/2023 09/16/23   Norleen Lynwood ORN, MD  rosuvastatin  (CRESTOR ) 20 MG tablet Take 1 tablet (20 mg total) by mouth daily. 09/16/23   Norleen Lynwood ORN, MD  triamcinolone  (NASACORT ) 55 MCG/ACT AERO nasal inhaler Place 2 sprays into the nose daily. Patient not taking: Reported on 10/20/2023 09/07/20   Norleen Lynwood ORN, MD  warfarin (COUMADIN ) 4 MG tablet TAKE 2 TABLETS BY MOUTH DAILY EXCEPT TAKE 1 TABLET ON MONDAYS AND FRIDAYS OR AS DIRECTED BY COAGULATION CLINIC 11/25/23   Norleen Lynwood ORN, MD    Family History Family History  Problem Relation Age of Onset   Heart attack Father    Diabetes Other        first degree relatives    Social History Social History   Tobacco Use   Smoking status: Former   Smokeless tobacco: Never  Vaping Use   Vaping status: Never Used  Substance Use Topics   Alcohol use: No    Alcohol/week: 0.0 standard drinks of alcohol   Drug use: No     Allergies   Oxycodone   Review of Systems Review of Systems  Cardiovascular:  Positive for chest pain.  Gastrointestinal:  Positive for diarrhea.     Physical Exam Triage Vital Signs ED Triage Vitals  Encounter Vitals Group     BP 01/08/24 0839 (!) 140/80     Girls Systolic BP Percentile --      Girls Diastolic BP Percentile --      Boys Systolic BP Percentile --      Boys Diastolic BP Percentile --      Pulse Rate 01/08/24 0839 77     Resp 01/08/24 0839 20     Temp 01/08/24 0839 98.9 F (37.2 C)     Temp Source 01/08/24 0839 Oral     SpO2 01/08/24 0839 97 %     Weight --      Height --      Head Circumference --      Peak Flow --      Pain Score 01/08/24 0840 5     Pain Loc --      Pain Education --      Exclude from Growth Chart --    No data found.  Updated Vital Signs BP (!) 140/80 (BP Location: Right Arm)   Pulse 77   Temp 98.9 F (37.2 C) (Oral)   Resp 20   SpO2 97%   Visual Acuity Right Eye Distance:   Left Eye Distance:   Bilateral Distance:     Right Eye Near:   Left Eye Near:    Bilateral Near:     Physical Exam Constitutional:      General: He is not in acute distress.    Appearance: Normal appearance. He is not ill-appearing, toxic-appearing or diaphoretic.  HENT:     Right Ear: Tympanic membrane, ear canal and external  ear normal.     Left Ear: Tympanic membrane, ear canal and external ear normal.     Mouth/Throat:     Pharynx: Oropharynx is clear.  Eyes:     Conjunctiva/sclera: Conjunctivae normal.  Cardiovascular:     Rate and Rhythm: Normal rate and regular rhythm.     Pulses: Normal pulses.     Heart sounds: Normal heart sounds.  Pulmonary:     Effort: Pulmonary effort is normal.     Breath sounds: Normal breath sounds.  Musculoskeletal:        General: Normal range of motion.  Skin:    General: Skin is warm and dry.  Neurological:     Mental Status: He is alert.  Psychiatric:        Mood and Affect: Mood normal.      UC Treatments / Results  Labs (all labs ordered are listed, but only abnormal results are displayed) Labs Reviewed  POC SOFIA SARS ANTIGEN FIA - Normal    EKG   Radiology No results found.  Procedures Procedures (including critical care time)  Medications Ordered in UC Medications - No data to display  Initial Impression / Assessment and Plan / UC Course  I have reviewed the triage vital signs and the nursing notes.  Pertinent labs & imaging results that were available during my care of the patient were reviewed by me and considered in my medical decision making (see chart for details).     Acute cough, chest pain, body aches- covid test negative.  May be too early since symptoms started last night EKG with PVCs which is typical for him after reviewing previous EKGs.  Lungs clear on exam and no red flags. Most likely some sort of viral illness.  May have some related acid reflux that could be contributing.  Recommend go home, rest and over-the-counter medications for  symptoms as needed.  If the chest pain or problems worsen he will need to go to the ER for further workup.  Patient is standing and agree.  Final Clinical Impressions(s) / UC Diagnoses   Final diagnoses:  Acute cough     Discharge Instructions      Your COVID test was negative.  Your EKG did show some extra heartbeats but this seems to be normal with prior EKGs. Some your symptoms could be related to your acid reflux.  Otherwise you may have picked up some sort of virus.  Recommend over-the-counter medications for symptoms as needed.  Follow-up as needed For worsening chest pain or other problems he will need to go to the ER.    ED Prescriptions   None    PDMP not reviewed this encounter.   Adah Wilbert LABOR, FNP 01/08/24 [phone]

## 2024-01-08 NOTE — ED Triage Notes (Signed)
 Indigestion started last night, difficulty burping and feels like throat is swollen, had diarrhea last night. Reports not eating new or unusual food before episode. Hx of GERD. Patient took a tums with no relief. Patient had an entire sleeve of girl scout cookies before episode. Patient denies any radiation of pain outside of chest and had 2 episodes of diarrhea last night. Experienced sweating during episode. Patient was sweaty and clammy last night during episode. Patient says pain has subsided some this morning.

## 2024-01-30 ENCOUNTER — Ambulatory Visit

## 2024-01-30 DIAGNOSIS — Z7901 Long term (current) use of anticoagulants: Secondary | ICD-10-CM

## 2024-01-30 LAB — POCT INR: INR: 2.9 (ref 2.0–3.0)

## 2024-01-30 NOTE — Progress Notes (Signed)
 Continue  2 tablets daily except take 1 tablet on Mondays and Fridays. Recheck in 6 weeks.

## 2024-01-30 NOTE — Patient Instructions (Addendum)
 Pre visit review using our clinic review tool, if applicable. No additional management support is needed unless otherwise documented below in the visit note.  Continue  2 tablets daily except take 1 tablet on Mondays and Fridays. Recheck in 6 weeks.

## 2024-02-09 ENCOUNTER — Ambulatory Visit (INDEPENDENT_AMBULATORY_CARE_PROVIDER_SITE_OTHER): Payer: Medicare Other

## 2024-02-09 VITALS — BP 122/70 | HR 70 | Ht 76.0 in | Wt 287.8 lb

## 2024-02-09 DIAGNOSIS — Z0000 Encounter for general adult medical examination without abnormal findings: Secondary | ICD-10-CM | POA: Diagnosis not present

## 2024-02-09 NOTE — Progress Notes (Signed)
 Subjective:   Justin Woodard is a 68 y.o. who presents for a Medicare Wellness preventive visit.  As a reminder, Annual Wellness Visits don't include a physical exam, and some assessments may be limited, especially if this visit is performed virtually. We may recommend an in-person follow-up visit with your provider if needed.  Visit Complete: In person  Persons Participating in Visit: Patient.  AWV Questionnaire: Yes: Patient Medicare AWV questionnaire was completed by the patient on 02/09/2024; I have confirmed that all information answered by patient is correct and no changes since this date.  Cardiac Risk Factors include: advanced age (>13men, >55 women);dyslipidemia;hypertension;obesity (BMI >30kg/m2)     Objective:    Today's Vitals   02/09/24 0931  BP: 122/70  Pulse: 70  SpO2: 99%  Weight: 287 lb 12.8 oz (130.5 kg)  Height: 6' 4 (1.93 m)   Body mass index is 35.03 kg/m.     02/09/2024    9:50 AM 10/16/2023    1:33 AM 04/24/2023    7:31 AM 02/03/2023    3:22 PM 02/21/2022    1:49 PM 04/16/2015   10:36 AM 01/23/2015    9:44 AM  Advanced Directives  Does Patient Have a Medical Advance Directive? Yes No No Yes No No  No   Type of Estate agent of [place];Living will   Healthcare Power of [place];Living will     Copy of Healthcare Power of Attorney in Chart? No - copy requested   No - copy requested     Would patient like information on creating a medical advance directive?     No - Patient declined No - patient declined information       Data saved with a previous flowsheet row definition    Current Medications (verified) Outpatient Encounter Medications as of 02/09/2024  Medication Sig   Cholecalciferol (VITAMIN D3) 50 MCG (2000 UT) capsule Take 2 capsules (4,000 Units total) by mouth daily.   losartan  (COZAAR ) 100 MG tablet Take 1 tablet (100 mg total) by mouth daily.   metoprolol  succinate (TOPROL -XL) 50 MG 24 hr tablet Take 0.5 tablets (25  mg total) by mouth daily. Take with or immediately following a meal.   rosuvastatin  (CRESTOR ) 20 MG tablet Take 1 tablet (20 mg total) by mouth daily.   triamcinolone  (NASACORT ) 55 MCG/ACT AERO nasal inhaler Place 2 sprays into the nose daily.   warfarin (COUMADIN ) 4 MG tablet TAKE 2 TABLETS BY MOUTH DAILY EXCEPT TAKE 1 TABLET ON MONDAYS AND FRIDAYS OR AS DIRECTED BY COAGULATION CLINIC   Azelastine HCl 137 MCG/SPRAY SOLN Place into both nostrils. (Patient not taking: Reported on 02/09/2024)   cefdinir (OMNICEF) 300 MG capsule Take 300 mg by mouth 2 (two) times daily. (Patient not taking: Reported on 02/09/2024)   omeprazole  (PRILOSEC) 40 MG capsule Take 1 capsule 40 mg by mouth daily before breakfast. (Patient not taking: Reported on 02/09/2024)   No facility-administered encounter medications on file as of 02/09/2024.    Allergies (verified) Oxycodone   History: Past Medical History:  Diagnosis Date   A-fib [facility])    post PE only   Anxiety state 04/20/2007   Qualifier: Diagnosis of  By: Norleen MD, Lynwood ORN    DVT (deep venous thrombosis) (HCC)    2000   Dysphagia 09/13/2016   Erectile dysfunction 05/28/2011   Eustachian tube dysfunction, right 06/25/2017   GERD (gastroesophageal reflux disease)    Hypertension    Long term current use of anticoagulant 07/03/2010  Pulmonary embolism (HCC)    Radiculitis of right cervical region 09/13/2016   Right knee pain 06/25/2017   Shingles 02/06/2012   Symptomatic PVCs    Past Surgical History:  Procedure Laterality Date   BREAST SURGERY     TONSILLECTOMY     VEIN SURGERY     RLE saphenous vein stripping   Family History  Problem Relation Age of Onset   Heart attack Father    Diabetes Other        first degree relatives   Social History   Socioeconomic History   Marital status: Married    Spouse name: Justin Woodard   Number of children: 2   Years of education: Not on file   Highest education level: Some college, no degree  Occupational History    Occupation: PARTS DEPT    Employer: UNEMPLOYED   Occupation: Retired  Tobacco Use   Smoking status: Former   Smokeless tobacco: Never  Building services engineer status: Never Used  Substance and Sexual Activity   Alcohol use: No    Alcohol/week: 0.0 standard drinks of alcohol   Drug use: No   Sexual activity: Yes  Other Topics Concern   Not on file  Social History Narrative   Former Smoker   Alcohol use-no   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, broadcasting/film/video   Social Drivers of Health   Financial Resource Strain: Low Risk  (02/09/2024)   Overall Financial Resource Strain (CARDIA)    Difficulty of Paying Living Expenses: Not hard at all  Food Insecurity: No Food Insecurity (02/09/2024)   Hunger Vital Sign    Worried About Running Out of Food in the Last Year: Never true    Ran Out of Food in the Last Year: Never true  Transportation Needs: No Transportation Needs (02/09/2024)   PRAPARE - Administrator, Civil Service (Medical): No    Lack of Transportation (Non-Medical): No  Physical Activity: Insufficiently Active (02/09/2024)   Exercise Vital Sign    Days of Exercise per Week: 1 day    Minutes of Exercise per Session: 10 min  Stress: Stress Concern Present (02/09/2024)   [employer] of Occupational Health - Occupational Stress Questionnaire    Feeling of Stress: Very much  Social Connections: Socially Isolated (02/09/2024)   Social Connection and Isolation Panel    Frequency of Communication with Friends and Family: Once a week    Frequency of Social Gatherings with Friends and Family: Once a week    Attends Religious Services: Never    Database administrator or Organizations: No    Attends Engineer, structural: Never    Marital Status: Married    Tobacco Counseling Counseling given: Not Answered    Clinical Intake:  Pre-visit preparation completed: Yes  Pain : No/denies pain     BMI - recorded: 35.03 Nutritional Status: BMI > 30   Obese Nutritional Risks: None Diabetes: No  Lab Results  Component Value Date   HGBA1C 6.0 09/16/2023   HGBA1C 6.0 03/18/2023   HGBA1C 5.9 09/09/2022     How often do you need to have someone help you when you read instructions, pamphlets, or other written materials from your doctor or pharmacy?: 1 - Never  Interpreter Needed?: No  Information entered by :: Justin Woodard, CMA   Activities of Daily Living     02/09/2024    9:34 AM 02/09/2024    6:06 AM  In your present  state of health, do you have any difficulty performing the following activities:  Hearing? 0 0  Vision? 0 0  Difficulty concentrating or making decisions? 0 0  Walking or climbing stairs? 1 1  Comment right knee discomfort   Dressing or bathing? 0 0  Doing errands, shopping? 0 0  Preparing Food and eating ? N Y  Comment Spouse handles   Using the Toilet? N N  In the past six months, have you accidently leaked urine? N N  Do you have problems with loss of bowel control? N N  Managing your Medications? N N  Managing your Finances? N N  Housekeeping or managing your Housekeeping? N N    Patient Care Team: Norleen Lynwood ORN, MD as PCP - Diedre Cleotilde Sewer, OD as Consulting Physician (Optometry)  I have updated your Care Teams any recent Medical Services you may have received from other providers in the past year.     Assessment:   This is a routine wellness examination for Justin Woodard.  Hearing/Vision screen Hearing Screening - Comments:: Denies hearing difficulties   Vision Screening - Comments:: Wears rx glasses - up to date with routine eye exams with Sewer Cleotilde   Goals Addressed               This Visit's Progress     Patient Stated (pt-stated)        Patient stated he plans to monitor his right knee pain        Depression Screen     02/09/2024    9:35 AM 10/20/2023    3:06 PM 10/14/2023    2:54 PM 09/16/2023   11:13 AM 02/03/2023    3:26 PM 09/16/2022   11:03 AM 02/21/2022    1:52 PM   PHQ 2/9 Scores  PHQ - 2 Score 0 0 0 0 0 3 0  PHQ- 9 Score 1    3 6      Fall Risk     02/09/2024    9:35 AM 02/09/2024    6:06 AM 10/20/2023    3:12 PM 10/14/2023    3:01 PM 09/16/2023   11:22 AM  Fall Risk   Falls in the past year? 0 0 0 0 0  Number falls in past yr: 0 0 0 0 0  Injury with Fall? 0 0 0 0 0  Risk for fall due to : No Fall Risks  No Fall Risks No Fall Risks No Fall Risks  Follow up Falls evaluation completed;Falls prevention discussed  Falls evaluation completed Falls evaluation completed Falls evaluation completed    MEDICARE RISK AT HOME:  Medicare Risk at Home Any stairs in or around the home?: Yes If so, are there any without handrails?: No Home free of loose throw rugs in walkways, pet beds, electrical cords, etc?: No Adequate lighting in your home to reduce risk of falls?: Yes Life alert?: No Use of a cane, walker or w/c?: No Grab bars in the bathroom?: No Shower chair or bench in shower?: No Elevated toilet seat or a handicapped toilet?: No  TIMED UP AND GO:  Was the test performed?  No  Cognitive Function: 6CIT completed        02/09/2024    9:39 AM 02/03/2023    3:23 PM 02/21/2022    2:03 PM  6CIT Screen  What Year? 0 points 0 points 0 points  What month? 0 points 0 points 0 points  What time? 0 points 0 points 0 points  Count back from 20 0 points 0 points 0 points  Months in reverse 0 points 0 points 0 points  Repeat phrase 2 points 0 points 0 points  Total Score 2 points 0 points 0 points    Immunizations Immunization History  Administered Date(s) Administered   Fluad Quad(high Dose 65+) 03/07/2020, 02/02/2021, 03/13/2022   Fluad Trivalent(High Dose 65+) 02/25/2023   Influenza,inj,Quad PF,6+ Mos 06/25/2017, 06/05/2018   PFIZER(Purple Top)SARS-COV-2 Vaccination 07/26/2019, 08/16/2019   Pfizer(Comirnaty)Fall Seasonal Vaccine 12 years and older 04/02/2023    Screening Tests Health Maintenance  Topic Date Due   DTaP/Tdap/Td (1 - Tdap)  Never done   Zoster Vaccines- Shingrix (1 of 2) Never done   Pneumococcal Vaccine: 50+ Years (1 of 1 - PCV) Never done   Influenza Vaccine  12/19/2023   Colonoscopy  02/08/2025 (Originally 09/24/1998)   Fecal DNA (Cologuard)  11/09/2024   Medicare Annual Wellness (AWV)  02/08/2025   Hepatitis C Screening  Completed   HPV VACCINES  Aged Out   Meningococcal B Vaccine  Aged Out   COVID-19 Vaccine  Discontinued    Health Maintenance Items Addressed:  Additional Screening:  Vision Screening: Recommended annual ophthalmology exams for early detection of glaucoma and other disorders of the eye. Is the patient up to date with their annual eye exam?  Yes  Who is the provider or what is the name of the office in which the patient attends annual eye exams? [facility]  Dental Screening: Recommended annual dental exams for proper oral hygiene  Community Resource Referral / Chronic Care Management: CRR required this visit?  No   CCM required this visit?  No   Plan:    I have personally reviewed and noted the following in the patient's chart:   Medical and social history Use of alcohol, tobacco or illicit drugs  Current medications and supplements including opioid prescriptions. Patient is not currently taking opioid prescriptions. Functional ability and status Nutritional status Physical activity Advanced directives List of other physicians Hospitalizations, surgeries, and ER visits in previous 12 months Vitals Screenings to include cognitive, depression, and falls Referrals and appointments  In addition, I have reviewed and discussed with patient certain preventive protocols, quality metrics, and best practice recommendations. A written personalized care plan for preventive services as well as general preventive health recommendations were provided to patient.   Justin CHRISTELLA Woodard, CMA   02/09/2024   After Visit Summary: (In Person-Declined) Patient declined AVS at this  time.  Notes: Pt will schedule a 6-mth f/u prior to leaving the office today.

## 2024-02-09 NOTE — Patient Instructions (Addendum)
 [name],  Thank you for taking the time for your Medicare Wellness Visit. I appreciate your continued commitment to your health goals. Please review the care plan we discussed, and feel free to reach out if I can assist you further.  Medicare recommends these wellness visits once per year to help you and your care team stay ahead of potential health issues. These visits are designed to focus on prevention, allowing your provider to concentrate on managing your acute and chronic conditions during your regular appointments.  Please note that Annual Wellness Visits do not include a physical exam. Some assessments may be limited, especially if the visit was conducted virtually. If needed, we may recommend a separate in-person follow-up with your provider.  Ongoing Care Seeing your primary care provider every 3 to 6 months helps us  monitor your health and provide consistent, personalized care.   Referrals If a referral was made during today's visit and you haven't received any updates within two weeks, please contact the referred provider directly to check on the status.  Recommended Screenings:  Health Maintenance  Topic Date Due   DTaP/Tdap/Td vaccine (1 - Tdap) Never done   Zoster (Shingles) Vaccine (1 of 2) Never done   Pneumococcal Vaccine for age over 7 (1 of 1 - PCV) Never done   Flu Shot  12/19/2023   Colon Cancer Screening  02/08/2025*   Cologuard (Stool DNA test)  11/09/2024   Medicare Annual Wellness Visit  02/08/2025   Hepatitis C Screening  Completed   HPV Vaccine  Aged Out   Meningitis B Vaccine  Aged Out   COVID-19 Vaccine  Discontinued  *Topic was postponed. The date shown is not the original due date.       02/09/2024    9:50 AM  Advanced Directives  Does Patient Have a Medical Advance Directive? Yes  Type of Estate agent of [place];Living will  Copy of Healthcare Power of Attorney in Chart? No - copy requested   Advance Care Planning is  important because it: Ensures you receive medical care that aligns with your values, goals, and preferences. Provides guidance to your family and loved ones, reducing the emotional burden of decision-making during critical moments.  Vision: Annual vision screenings are recommended for early detection of glaucoma, cataracts, and diabetic retinopathy. These exams can also reveal signs of chronic conditions such as diabetes and high blood pressure.  Dental: Annual dental screenings help detect early signs of oral cancer, gum disease, and other conditions linked to overall health, including heart disease and diabetes.

## 2024-03-12 ENCOUNTER — Ambulatory Visit

## 2024-03-12 DIAGNOSIS — Z7901 Long term (current) use of anticoagulants: Secondary | ICD-10-CM | POA: Diagnosis not present

## 2024-03-12 LAB — POCT INR: INR: 3.1 — AB (ref 2.0–3.0)

## 2024-03-12 NOTE — Patient Instructions (Addendum)
 Pre visit review using our clinic review tool, if applicable. No additional management support is needed unless otherwise documented below in the visit note.  Reduce dose today to take 1/2 tablet and then continue  2 tablets daily except take 1 tablet on Mondays and Fridays. Recheck in 4 weeks.

## 2024-03-12 NOTE — Progress Notes (Signed)
 Indication: DVT, PE Reduce dose today to take 1/2 tablet and then continue  2 tablets daily except take 1 tablet on Mondays and Fridays. Recheck in 4 weeks.

## 2024-03-15 ENCOUNTER — Telehealth: Payer: Self-pay | Admitting: Internal Medicine

## 2024-03-15 NOTE — Telephone Encounter (Signed)
 Call to patient to discuss concerns. 2 identifiers used. Patient reports he woke up yesterday at 7 AM and HR was 42. He did not have any symptoms.  Today, he woke at 7 AM and his HR was 90, eventually coming down to 60. He states he couldn't get back to sleep because he was worried about his heart stopping.   Patient reports that he has been feeling poorly for about a month, mainly due to difficulty sleeping. He states he can't sleep at night and feels sleepy all day. He states he often wakes up with a headache. He does report that he has been offered sleep apnea testing before but he didn't want to do it. He denies any chest pain or SOB. Forwarded to Dr. Waddell for recommendations.

## 2024-03-15 NOTE — Telephone Encounter (Signed)
 Per patients MyChart message: Yearly checkup. Also , my pulse rate is all over the place sometimes. For yearly f/u, I scheduled the patient on 11/3 w/ EP APP.  STAT if HR is under 50 or over 120  (normal HR is 60-100 beats per minute)  What is your heart rate? Yesterday morning, when I woke up 7:00 ish, pulse was 42. I felt fine. This morning, woke up around 5:00, felt bad alround, pulse was 90. It went down to normal 60s in a few minutes, but still don't feel well.  Do you have a log of your heart rate readings (document readings)? See above  Do you have any other symptoms? don't feel well - no particular symptoms given

## 2024-03-18 NOTE — Progress Notes (Signed)
 This encounter was created in error - please disregard.

## 2024-03-21 NOTE — Progress Notes (Unsigned)
 Electrophysiology Office Note:   Date:  03/23/2024  ID:  Justin Woodard, DOB 1956/04/20, MRN [id number]  Primary Cardiologist: None Primary Heart Failure: None Electrophysiologist: Danelle Birmingham, MD      History of Present Illness:   Justin Woodard is a 68 y.o. male with h/o AF, PVC's, near syncope, aortic atherosclerosis, HTN, HLD, PE, GERD, depression/anxiety seen today for routine electrophysiology followup.   Pt called for annual visit and reported he was feeling poorly and his HR's were all over the place.  Rates of 43 to 90 and then back to 60 bpm. Noted he had been feeling poorly for one month - variable HR, waking with a headache, sleepy during the day.  Previously offered sleep study but he did not complete.   Since last being seen in our clinic the patient reports he has been fatigued for several months. He checks his HR on his watch and by a pulse oximeter and gets numbers that are all over the place. He does not have palpitations but the readings are keeping him up at night because he is worried about the numbers.  Reports bilateral upper arm cramping at times. He notes he feels sleepy during the day a lot.  He was previously offered a sleep study and he states he just doesn't want to deal with a machine.  He also reports he has been keeping his grandkids and due to being busy with them, he has lost from 303lbs > 287lbs.   He denies chest pain, palpitations, dyspnea, PND, orthopnea, nausea, vomiting, dizziness, syncope, edema, weight gain, or early satiety.   Review of systems complete and found to be negative unless listed in HPI.   EP Information / Studies Reviewed:    EKG is ordered today. Personal review as below.  EKG Interpretation Date/Time:  Tuesday March 23 2024 09:28:21 EST Ventricular Rate:  72 PR Interval:  284 QRS Duration:  106 QT Interval:  414 QTC Calculation: 453 R Axis:   43  Text Interpretation: Sinus rhythm with 1st degree A-V block with  Premature ventricular complexes Confirmed by Aniceto Jarvis ([id number]) on 03/23/2024 9:40:05 AM   Arrhythmia / AAD / Pertinent EP Studies AF  PVC's   Risk Assessment/Calculations:    CHA2DS2-VASc Score = 3   This indicates a 3.2% annual risk of stroke. The patient's score is based upon: CHF History: 0 HTN History: 1 Diabetes History: 0 Stroke History: 0 Vascular Disease History: 1 Age Score: 1 Gender Score: 0    HYPERTENSION CONTROL Vitals:   03/23/24 0928 03/23/24 0945  BP: (!) 140/88 (!) 140/80    The patient's blood pressure is elevated above target today.  In order to address the patient's elevated BP: Blood pressure will be monitored at home to determine if medication changes need to be made.           Physical Exam:   VS:  BP (!) 140/80   Pulse 70   Resp 16   Ht 6' 4 (1.93 m)   Wt 287 lb 9.6 oz (130.5 kg)   SpO2 97%   BMI 35.01 kg/m    Wt Readings from Last 3 Encounters:  03/23/24 287 lb 9.6 oz (130.5 kg)  02/09/24 287 lb 12.8 oz (130.5 kg)  10/20/23 294 lb (133.4 kg)     GEN: Well nourished, well developed in no acute distress NECK: No JVD; No carotid bruits CARDIAC: Regular rate and rhythm with frequent ectopy, no murmurs, rubs, gallops RESPIRATORY:  Clear to  auscultation without rales, wheezing or rhonchi  ABDOMEN: Soft, non-tender, non-distended EXTREMITIES:  No edema; No deformity   ASSESSMENT AND PLAN:    Paroxysmal Atrial Fibrillation  CHA2DS2-VASc 3 -OAC for stroke prophylaxis  -EKG with NSR, 1st degree AVB with PVC's  -continue Toprol  25 mg daily   Secondary Hypercoagulable State  -continue Coumadin   Hx PE -OAC as above   PVC's  Fatigue  -assess cardiac monitor to review PVC burden, AF  -assess ECHO to ensure PVC burden not impacting EF   -variable HR likely bradysphygmia   Hypertension  CAC 10/2021 of 0 -BP historically has been controlled at prior clinic visits > elevated today 140/80 by my manual check. Pt instructed to keep a  record of his BP over the next couple of weeks and take with him to his PCP.  May need dose adjustment / med change but did not want to make based off one reading  OSA Symptoms > Fatigue, Daytime Sleepiness  -sleep study offered but pt declined     Follow up with Dr. Waddell / EP APP 4-6 weeks (pt hopeful to say goodbye to Dr. Waddell)  Signed, Daphne Barrack, NP-C, AGACNP-BC [facility] - Electrophysiology  03/23/2024, 10:13 AM

## 2024-03-22 ENCOUNTER — Ambulatory Visit: Admitting: Pulmonary Disease

## 2024-03-22 DIAGNOSIS — I480 Paroxysmal atrial fibrillation: Secondary | ICD-10-CM

## 2024-03-22 DIAGNOSIS — D6869 Other thrombophilia: Secondary | ICD-10-CM

## 2024-03-22 NOTE — Telephone Encounter (Signed)
 Pt has appointment with Daphne Barrack on 11/3

## 2024-03-23 ENCOUNTER — Encounter: Payer: Self-pay | Admitting: Pulmonary Disease

## 2024-03-23 ENCOUNTER — Ambulatory Visit: Attending: Pulmonary Disease

## 2024-03-23 ENCOUNTER — Ambulatory Visit: Attending: Pulmonary Disease | Admitting: Pulmonary Disease

## 2024-03-23 VITALS — BP 140/80 | HR 70 | Resp 16 | Ht 76.0 in | Wt 287.6 lb

## 2024-03-23 DIAGNOSIS — I480 Paroxysmal atrial fibrillation: Secondary | ICD-10-CM

## 2024-03-23 DIAGNOSIS — I10 Essential (primary) hypertension: Secondary | ICD-10-CM

## 2024-03-23 DIAGNOSIS — D6869 Other thrombophilia: Secondary | ICD-10-CM

## 2024-03-23 NOTE — Progress Notes (Unsigned)
 Enrolled patient for a 7 day Zio XT monitor to be mailed to patients home   Justin Woodard to read

## 2024-03-23 NOTE — Patient Instructions (Signed)
 Medication Instructions:  No medications changes today > please check your BP at home over the next 2 weeks and take with you to see Dr. Norleen.  Your BP was slightly elevated in clinic today but historically has not been.  *If you need a refill on your cardiac medications before your next appointment, please call your pharmacy*  Lab Work: No lab work today  If you have labs (blood work) drawn today and your tests are completely normal, you will receive your results only by: MyChart Message (if you have MyChart) OR A paper copy in the mail If you have any lab test that is abnormal or we need to change your treatment, we will call you to review the results.  Testing/Procedures: We will order an ECHO to review your ejection fraction (pump function) and assess impact of PVC's  In addition, you will receive a heart monitor in the mail to wear for 7 days.  This will help review for AF and PVC burden.   Follow-Up: At [facility], you and your health needs are our priority.  As part of our continuing mission to provide you with exceptional heart care, our providers are all part of one team.  This team includes your primary Cardiologist (physician) and Advanced Practice Providers or APPs (Physician Assistants and Nurse Practitioners) who all work together to provide you with the care you need, when you need it.  Your next appointment:   4-6 weeks with Dr. Waddell   Provider:   You may see Danelle Waddell, MD or one of the following Advanced Practice Providers on your designated Care Team:    Daphne Barrack, NP    We recommend signing up for the patient portal called MyChart.  Sign up information is provided on this After Visit Summary.  MyChart is used to connect with patients for Virtual Visits (Telemedicine).  Patients are able to view lab/test results, encounter notes, upcoming appointments, etc.  Non-urgent messages can be sent to your provider as well.   To learn more about what you can  do with MyChart, go to [email or web address].

## 2024-03-28 ENCOUNTER — Other Ambulatory Visit: Payer: Self-pay | Admitting: Internal Medicine

## 2024-03-31 ENCOUNTER — Encounter: Admitting: Internal Medicine

## 2024-04-02 ENCOUNTER — Ambulatory Visit

## 2024-04-02 ENCOUNTER — Encounter: Payer: Self-pay | Admitting: Internal Medicine

## 2024-04-02 ENCOUNTER — Ambulatory Visit: Admitting: Internal Medicine

## 2024-04-02 VITALS — BP 140/82 | HR 60 | Temp 98.2°F | Ht 76.0 in | Wt 287.0 lb

## 2024-04-02 DIAGNOSIS — R739 Hyperglycemia, unspecified: Secondary | ICD-10-CM | POA: Diagnosis not present

## 2024-04-02 DIAGNOSIS — G4710 Hypersomnia, unspecified: Secondary | ICD-10-CM | POA: Diagnosis not present

## 2024-04-02 DIAGNOSIS — E7800 Pure hypercholesterolemia, unspecified: Secondary | ICD-10-CM

## 2024-04-02 DIAGNOSIS — E538 Deficiency of other specified B group vitamins: Secondary | ICD-10-CM | POA: Diagnosis not present

## 2024-04-02 DIAGNOSIS — I10 Essential (primary) hypertension: Secondary | ICD-10-CM

## 2024-04-02 DIAGNOSIS — Z23 Encounter for immunization: Secondary | ICD-10-CM | POA: Diagnosis not present

## 2024-04-02 DIAGNOSIS — Z7901 Long term (current) use of anticoagulants: Secondary | ICD-10-CM

## 2024-04-02 DIAGNOSIS — E559 Vitamin D deficiency, unspecified: Secondary | ICD-10-CM

## 2024-04-02 LAB — URINALYSIS, ROUTINE W REFLEX MICROSCOPIC
Bilirubin Urine: NEGATIVE
Hgb urine dipstick: NEGATIVE
Ketones, ur: NEGATIVE
Leukocytes,Ua: NEGATIVE
Nitrite: NEGATIVE
RBC / HPF: NONE SEEN (ref 0–?)
Specific Gravity, Urine: <=1.005 — AB (ref 1.000–1.030)
Total Protein, Urine: NEGATIVE
Urine Glucose: NEGATIVE
Urobilinogen, UA: 0.2 (ref 0.0–1.0)
WBC, UA: NONE SEEN (ref 0–?)
pH: 7 (ref 5.0–8.0)

## 2024-04-02 LAB — BASIC METABOLIC PANEL WITH GFR
BUN: 14 mg/dL (ref 6–23)
CO2: 29 meq/L (ref 19–32)
Calcium: 9 mg/dL (ref 8.4–10.5)
Chloride: 102 meq/L (ref 96–112)
Creatinine, Ser: 1.31 mg/dL (ref 0.40–1.50)
GFR: 55.14 mL/min — ABNORMAL LOW (ref >60.00)
Glucose, Bld: 91 mg/dL (ref 70–99)
Potassium: 4.2 meq/L (ref 3.5–5.1)
Sodium: 137 meq/L (ref 135–145)

## 2024-04-02 LAB — CBC WITH DIFFERENTIAL/PLATELET
Basophils Absolute: 0.1 K/uL (ref 0.0–0.1)
Basophils Relative: 1.2 % (ref 0.0–3.0)
Eosinophils Absolute: 0.1 K/uL (ref 0.0–0.7)
Eosinophils Relative: 2 % (ref 0.0–5.0)
HCT: 39 % (ref 39.0–52.0)
Hemoglobin: 13.8 g/dL (ref 13.0–17.0)
Lymphocytes Relative: 45.4 % (ref 12.0–46.0)
Lymphs Abs: 2.5 K/uL (ref 0.7–4.0)
MCHC: 35.3 g/dL (ref 30.0–36.0)
MCV: 83.8 fl (ref 78.0–100.0)
Monocytes Absolute: 0.4 K/uL (ref 0.1–1.0)
Monocytes Relative: 7.3 % (ref 3.0–12.0)
Neutro Abs: 2.5 K/uL (ref 1.4–7.7)
Neutrophils Relative %: 44.1 % (ref 43.0–77.0)
Platelets: 209 K/uL (ref 150.0–400.0)
RBC: 4.66 Mil/uL (ref 4.22–5.81)
RDW: 13.6 % (ref 11.5–15.5)
WBC: 5.6 K/uL (ref 4.0–10.5)

## 2024-04-02 LAB — LIPID PANEL
Cholesterol: 124 mg/dL (ref 0–200)
HDL: 36.4 mg/dL — ABNORMAL LOW (ref >39.00)
LDL Cholesterol: 44 mg/dL (ref 0–99)
NonHDL: 87.72
Total CHOL/HDL Ratio: 3
Triglycerides: 217 mg/dL — ABNORMAL HIGH (ref 0.0–149.0)
VLDL: 43.4 mg/dL — ABNORMAL HIGH (ref 0.0–40.0)

## 2024-04-02 LAB — HEPATIC FUNCTION PANEL
ALT: 21 U/L (ref 0–53)
AST: 20 U/L (ref 0–37)
Albumin: 4 g/dL (ref 3.5–5.2)
Alkaline Phosphatase: 61 U/L (ref 39–117)
Bilirubin, Direct: 0.3 mg/dL (ref 0.0–0.3)
Total Bilirubin: 1.8 mg/dL — ABNORMAL HIGH (ref 0.2–1.2)
Total Protein: 7 g/dL (ref 6.0–8.3)

## 2024-04-02 LAB — VITAMIN B12: Vitamin B-12: 452 pg/mL (ref 211–911)

## 2024-04-02 LAB — POCT INR: INR: 3 (ref 2.0–3.0)

## 2024-04-02 LAB — VITAMIN D 25 HYDROXY (VIT D DEFICIENCY, FRACTURES): VITD: 31.77 ng/mL (ref 30.00–100.00)

## 2024-04-02 LAB — TSH: TSH: 3.11 u[IU]/mL (ref 0.35–5.50)

## 2024-04-02 NOTE — Assessment & Plan Note (Signed)
 Last vitamin D  Lab Results  Component Value Date   VD25OH 30.53 09/16/2023   Low, to start oral replacement

## 2024-04-02 NOTE — Assessment & Plan Note (Signed)
 Lab Results  Component Value Date   HGBA1C 6.0 09/16/2023   Stable, pt to continue current medical treatment  - diet,w t control

## 2024-04-02 NOTE — Assessment & Plan Note (Signed)
 Lab Results  Component Value Date   LDLCALC 41 09/16/2023   Stable, pt to continue current statin crestor  20 qd

## 2024-04-02 NOTE — Progress Notes (Signed)
 Indication: DVT, PE Pt also has apt with PCP today. Continue  2 tablets daily except take 1 tablet on Mondays and Fridays. Recheck in 5 weeks.

## 2024-04-02 NOTE — Assessment & Plan Note (Signed)
 Worsening subjectively recently, d/w pt possible OSA but declines pulm referral for now

## 2024-04-02 NOTE — Assessment & Plan Note (Signed)
 BP Readings from Last 3 Encounters:  04/02/24 (!) 140/82  03/23/24 (!) 140/80  02/09/24 122/70   Uncontrolled, but controlled at home per pt, pt to continue medical treatment losartan  100 mg every day, toprol  xl 25 qd

## 2024-04-02 NOTE — Patient Instructions (Signed)
You had the flu shot today  Please continue all other medications as before, and refills have been done if requested.  Please have the pharmacy call with any other refills you may need.  Please continue your efforts at being more active, low cholesterol diet, and weight control  Please keep your appointments with your specialists as you may have planned  Please go to the LAB at the blood drawing area for the tests to be done  You will be contacted by phone if any changes need to be made immediately.  Otherwise, you will receive a letter about your results with an explanation, but please check with MyChart first.  Please make an Appointment to return in 6 months, or sooner if needed

## 2024-04-02 NOTE — Patient Instructions (Addendum)
 Pre visit review using our clinic review tool, if applicable. No additional management support is needed unless otherwise documented below in the visit note.  Continue  2 tablets daily except take 1 tablet on Mondays and Fridays. Recheck in 5 weeks.

## 2024-04-02 NOTE — Progress Notes (Signed)
 Patient ID: Justin Woodard, male   DOB: 22-Sep-1955, 68 y.o.   MRN: [id number]        Chief Complaint: follow up low vit d, htn, hyperglycmia, hld, hypersomnolence       HPI:  Justin Woodard is a 68 y.o. male here overall doing ok but does mention recent worsening mild daytime somnolence without falling asleep, but tends to notice while driving recently.  Pt denies chest pain, increased sob or doe, wheezing, orthopnea, PND, increased LE swelling, palpitations, dizziness or syncope.   Pt denies polydipsia, polyuria, or new focal neuro s/s.    Pt denies fever, wt loss, night sweats, loss of appetite, or other constitutional symptoms  Finishing card monitor tomorrow,   Due for flu shot today Wt Readings from Last 3 Encounters:  04/02/24 287 lb (130.2 kg)  03/23/24 287 lb 9.6 oz (130.5 kg)  02/09/24 287 lb 12.8 oz (130.5 kg)   BP Readings from Last 3 Encounters:  04/02/24 (!) 140/82  03/23/24 (!) 140/80  02/09/24 122/70         Past Medical History:  Diagnosis Date   A-fib [facility])    post PE only   Anxiety state 04/20/2007   Qualifier: Diagnosis of  By: Norleen MD, Lynwood ORN    DVT (deep venous thrombosis) (HCC)    2000   Dysphagia 09/13/2016   Erectile dysfunction 05/28/2011   Eustachian tube dysfunction, right 06/25/2017   GERD (gastroesophageal reflux disease)    Hypertension    Long term current use of anticoagulant 07/03/2010   Pulmonary embolism (HCC)    Radiculitis of right cervical region 09/13/2016   Right knee pain 06/25/2017   Shingles 02/06/2012   Symptomatic PVCs    Past Surgical History:  Procedure Laterality Date   BREAST SURGERY     TONSILLECTOMY     VEIN SURGERY     RLE saphenous vein stripping    reports that he has quit smoking. He has never used smokeless tobacco. He reports that he does not drink alcohol and does not use drugs. family history includes Diabetes in an other family member; Heart attack in his father. Allergies  Allergen Reactions   Oxycodone Other (See  Comments)    Just made me feel bad   Current Outpatient Medications on File Prior to Visit  Medication Sig Dispense Refill   Cholecalciferol (VITAMIN D3) 50 MCG (2000 UT) capsule Take 2 capsules (4,000 Units total) by mouth daily. 99 capsule 99   losartan  (COZAAR ) 100 MG tablet TAKE 1 TABLET BY MOUTH EVERY DAY 90 tablet 3   metoprolol  succinate (TOPROL -XL) 50 MG 24 hr tablet Take 0.5 tablets (25 mg total) by mouth daily. Take with or immediately following a meal. 45 tablet 3   rosuvastatin  (CRESTOR ) 20 MG tablet Take 1 tablet (20 mg total) by mouth daily. 90 tablet 3   warfarin (COUMADIN ) 4 MG tablet TAKE 2 TABLETS BY MOUTH DAILY EXCEPT TAKE 1 TABLET ON MONDAYS AND FRIDAYS OR AS DIRECTED BY COAGULATION CLINIC 150 tablet 1   No current facility-administered medications on file prior to visit.        ROS:  All others reviewed and negative.  Objective        PE:  BP (!) 140/82 (BP Location: Right Arm, Patient Position: Sitting, Cuff Size: Normal)   Pulse 60   Temp 98.2 F (36.8 C) (Oral)   Ht 6' 4 (1.93 m)   Wt 287 lb (130.2 kg)   SpO2 98%  BMI 34.93 kg/m                 Constitutional: Pt appears in NAD               HENT: Head: NCAT.                Right Ear: External ear normal.                 Left Ear: External ear normal.                Eyes: . Pupils are equal, round, and reactive to light. Conjunctivae and EOM are normal               Nose: without d/c or deformity               Neck: Neck supple. Gross normal ROM               Cardiovascular: Normal rate and regular rhythm.                 Pulmonary/Chest: Effort normal and breath sounds without rales or wheezing.                Abd:  Soft, NT, ND, + BS, no organomegaly               Neurological: Pt is alert. At baseline orientation, motor grossly intact               Skin: Skin is warm. No rashes, no other new lesions, LE edema - none               Psychiatric: Pt behavior is normal without agitation   Micro:  none  Cardiac tracings I have personally interpreted today:  none  Pertinent Radiological findings (summarize): none   Lab Results  Component Value Date   WBC 6.2 10/16/2023   HGB 11.5 (L) 10/16/2023   HCT 34.6 (L) 10/16/2023   PLT 187 10/16/2023   GLUCOSE 129 (H) 10/16/2023   CHOL 116 09/16/2023   TRIG 193.0 (H) 09/16/2023   HDL 37.00 (L) 09/16/2023   LDLDIRECT 124.0 09/10/2021   LDLCALC 41 09/16/2023   ALT 18 09/16/2023   AST 17 09/16/2023   NA 133 (L) 10/16/2023   K 4.5 10/16/2023   CL 100 10/16/2023   CREATININE 1.48 (H) 10/16/2023   BUN 15 10/16/2023   CO2 23 10/16/2023   TSH 3.83 09/16/2023   PSA 2.54 09/16/2023   INR 3.0 04/02/2024   HGBA1C 6.0 09/16/2023   MICROALBUR <0.7 09/16/2023   Assessment/Plan:  Justin Woodard is a 68 y.o. White or Caucasian [1] male with  has a past medical history of A-fib [facility]), Anxiety state (04/20/2007), DVT (deep venous thrombosis) (HCC), Dysphagia (09/13/2016), Erectile dysfunction (05/28/2011), Eustachian tube dysfunction, right (06/25/2017), GERD (gastroesophageal reflux disease), Hypertension, Long term current use of anticoagulant (07/03/2010), Pulmonary embolism (HCC), Radiculitis of right cervical region (09/13/2016), Right knee pain (06/25/2017), Shingles (02/06/2012), and Symptomatic PVCs.  Vitamin D  deficiency Last vitamin D  Lab Results  Component Value Date   VD25OH 30.53 09/16/2023   Low, to start oral replacement   Hypertension, uncontrolled BP Readings from Last 3 Encounters:  04/02/24 (!) 140/82  03/23/24 (!) 140/80  02/09/24 122/70   Uncontrolled, but controlled at home per pt, pt to continue medical treatment losartan  100 mg every day, toprol  xl 25 qd   Hyperglycemia Lab Results  Component Value Date   HGBA1C  6.0 09/16/2023   Stable, pt to continue current medical treatment  - diet, wt control   HLD (hyperlipidemia) Lab Results  Component Value Date   LDLCALC 41 09/16/2023   Stable, pt to continue current  statin crestor  20 qd   B12 deficiency Lab Results  Component Value Date   VITAMINB12 391 09/16/2023   Stable, cont oral replacement - b12 1000 mcg qd   Hypersomnolence Worsening subjectively recently, d/w pt possible OSA but declines pulm referral for now  Followup: Return in about 6 months (around 09/30/2024).  Lynwood Rush, MD 04/02/2024 12:06 PM  [facility] - [facility]

## 2024-04-02 NOTE — Assessment & Plan Note (Signed)
 Lab Results  Component Value Date   VITAMINB12 391 09/16/2023   Stable, cont oral replacement - b12 1000 mcg qd

## 2024-04-05 ENCOUNTER — Ambulatory Visit: Payer: Self-pay | Admitting: Internal Medicine

## 2024-04-05 LAB — HEMOGLOBIN A1C: Hgb A1c MFr Bld: 5.7 % (ref 4.6–6.5)

## 2024-04-05 NOTE — Progress Notes (Signed)
 The test results show that your current treatment is OK, as the tests are stable.  Please continue the same plan.  There is no other need for change of treatment or further evaluation based on these results, at this time.  thanks

## 2024-04-09 ENCOUNTER — Ambulatory Visit: Admitting: Pulmonary Disease

## 2024-04-11 DIAGNOSIS — D6869 Other thrombophilia: Secondary | ICD-10-CM | POA: Diagnosis not present

## 2024-04-11 DIAGNOSIS — I480 Paroxysmal atrial fibrillation: Secondary | ICD-10-CM | POA: Diagnosis not present

## 2024-04-14 ENCOUNTER — Ambulatory Visit: Payer: Self-pay | Admitting: Pulmonary Disease

## 2024-04-14 DIAGNOSIS — I493 Ventricular premature depolarization: Secondary | ICD-10-CM

## 2024-04-14 MED ORDER — METOPROLOL SUCCINATE ER 50 MG PO TB24: 50.0000 mg | ORAL_TABLET | Freq: Every day | ORAL | 3 refills | Status: AC

## 2024-04-16 ENCOUNTER — Ambulatory Visit (HOSPITAL_COMMUNITY)
Admission: RE | Admit: 2024-04-16 | Discharge: 2024-04-16 | Disposition: A | Discharge status: Home / Self Care | Source: Ambulatory Visit | Attending: Pulmonary Disease | Admitting: Pulmonary Disease

## 2024-04-16 DIAGNOSIS — D6869 Other thrombophilia: Secondary | ICD-10-CM | POA: Diagnosis present

## 2024-04-16 DIAGNOSIS — I480 Paroxysmal atrial fibrillation: Secondary | ICD-10-CM | POA: Diagnosis present

## 2024-04-16 LAB — ECHOCARDIOGRAM COMPLETE
Area-P 1/2: 3.25 cm2
S' Lateral: 4.1 cm

## 2024-04-16 MED ORDER — PERFLUTREN LIPID MICROSPHERE
1.0000 mL | INTRAVENOUS | Status: AC | PRN
  Administered 2024-04-16: 3 mL via INTRAVENOUS

## 2024-05-07 ENCOUNTER — Ambulatory Visit

## 2024-05-07 DIAGNOSIS — Z7901 Long term (current) use of anticoagulants: Secondary | ICD-10-CM

## 2024-05-07 LAB — POCT INR: INR: 2.9 (ref 2.0–3.0)

## 2024-05-07 NOTE — Progress Notes (Signed)
 Indication: DVT, PE Pt missed a dose about 3 weeks ago so he took that dose the day after with that days dose. Continue  2 tablets daily except take 1 tablet on Mondays and Fridays. Recheck in 6 weeks.

## 2024-05-07 NOTE — Patient Instructions (Addendum)
 Pre visit review using our clinic review tool, if applicable. No additional management support is needed unless otherwise documented below in the visit note.  Continue  2 tablets daily except take 1 tablet on Mondays and Fridays. Recheck in 6 weeks.

## 2024-05-19 ENCOUNTER — Ambulatory Visit: Admitting: Internal Medicine

## 2024-05-19 ENCOUNTER — Encounter: Payer: Self-pay | Admitting: Internal Medicine

## 2024-05-19 VITALS — BP 142/72 | HR 70 | Ht 76.0 in | Wt 294.8 lb

## 2024-05-19 DIAGNOSIS — I493 Ventricular premature depolarization: Secondary | ICD-10-CM | POA: Diagnosis not present

## 2024-05-19 NOTE — Progress Notes (Signed)
 "     HPI Justin Woodard returns today for ongoing evaluation and management of PAF. He is a pleasant 68 yo man with HTN, peripheral edema, h/o pulm embolism, and dyslipidemia. I saw him initially due to PAF. He has been stable in the past year. He has chronic peripheral edema. He has been on amlodipine . He has 13% PVC burden but has minimal symptoms.  Allergies[1]   Current Outpatient Medications  Medication Sig Dispense Refill   Cholecalciferol (VITAMIN D3) 50 MCG (2000 UT) capsule Take 2 capsules (4,000 Units total) by mouth daily. 99 capsule 99   losartan  (COZAAR ) 100 MG tablet TAKE 1 TABLET BY MOUTH EVERY DAY 90 tablet 3   metoprolol  succinate (TOPROL -XL) 50 MG 24 hr tablet Take 1 tablet (50 mg total) by mouth daily. Take with or immediately following a meal. 90 tablet 3   rosuvastatin  (CRESTOR ) 20 MG tablet Take 1 tablet (20 mg total) by mouth daily. 90 tablet 3   warfarin (COUMADIN ) 4 MG tablet TAKE 2 TABLETS BY MOUTH DAILY EXCEPT TAKE 1 TABLET ON MONDAYS AND FRIDAYS OR AS DIRECTED BY COAGULATION CLINIC 150 tablet 1   No current facility-administered medications for this visit.     Past Medical History:  Diagnosis Date   A-fib [facility])    post PE only   Anxiety state 04/20/2007   Qualifier: Diagnosis of  By: Norleen MD, Lynwood ORN    DVT (deep venous thrombosis) (HCC)    2000   Dysphagia 09/13/2016   Erectile dysfunction 05/28/2011   Eustachian tube dysfunction, right 06/25/2017   GERD (gastroesophageal reflux disease)    Hypertension    Long term current use of anticoagulant 07/03/2010   Pulmonary embolism (HCC)    Radiculitis of right cervical region 09/13/2016   Right knee pain 06/25/2017   Shingles 02/06/2012   Symptomatic PVCs     ROS:   All systems reviewed and negative except as noted in the HPI.   Past Surgical History:  Procedure Laterality Date   BREAST SURGERY     TONSILLECTOMY     VEIN SURGERY     RLE saphenous vein stripping     Family History  Problem Relation Age  of Onset   Heart attack Father    Diabetes Other        first degree relatives     Social History   Socioeconomic History   Marital status: Married    Spouse name: Chief Technology Officer   Number of children: 2   Years of education: Not on file   Highest education level: Associate degree: occupational, scientist, product/process development, or vocational program  Occupational History   Occupation: PARTS DEPT    Employer: UNEMPLOYED   Occupation: Retired  Tobacco Use   Smoking status: Former   Smokeless tobacco: Never  Building Services Engineer status: Never Used  Substance and Sexual Activity   Alcohol use: No    Alcohol/week: 0.0 standard drinks of alcohol   Drug use: No   Sexual activity: Yes  Other Topics Concern   Not on file  Social History Narrative   Former Smoker   Alcohol use-no   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, broadcasting/film/video   Social Drivers of Health   Tobacco Use: Medium Risk (05/19/2024)   Patient History    Smoking Tobacco Use: Former    Smokeless Tobacco Use: Never    Passive Exposure: Not on Actuary Strain: Low Risk (04/01/2024)   Overall Physicist, Medical  Strain (CARDIA)    Difficulty of Paying Living Expenses: Not hard at all  Food Insecurity: No Food Insecurity (04/01/2024)   Epic    Worried About Programme Researcher, Broadcasting/film/video in the Last Year: Never true    Ran Out of Food in the Last Year: Never true  Transportation Needs: No Transportation Needs (04/01/2024)   Epic    Lack of Transportation (Medical): No    Lack of Transportation (Non-Medical): No  Physical Activity: Sufficiently Active (04/01/2024)   Exercise Vital Sign    Days of Exercise per Week: 5 days    Minutes of Exercise per Session: 40 min  Recent Concern: Physical Activity - Insufficiently Active (02/09/2024)   Exercise Vital Sign    Days of Exercise per Week: 1 day    Minutes of Exercise per Session: 10 min  Stress: Stress Concern Present (04/01/2024)   [employer] of Occupational Health -  Occupational Stress Questionnaire    Feeling of Stress: Rather much  Social Connections: Moderately Isolated (04/01/2024)   Social Connection and Isolation Panel    Frequency of Communication with Friends and Family: More than three times a week    Frequency of Social Gatherings with Friends and Family: More than three times a week    Attends Religious Services: Never    Database Administrator or Organizations: No    Attends Engineer, Structural: Not on file    Marital Status: Married  Catering Manager Violence: Not At Risk (02/09/2024)   Epic    Fear of Current or Ex-Partner: No    Emotionally Abused: No    Physically Abused: No    Sexually Abused: No  Depression (PHQ2-9): Low Risk (04/02/2024)   Depression (PHQ2-9)    PHQ-2 Score: 0  Alcohol Screen: Low Risk (02/09/2024)   Alcohol Screen    Last Alcohol Screening Score (AUDIT): 0  Housing: Low Risk (04/01/2024)   Epic    Unable to Pay for Housing in the Last Year: No    Number of Times Moved in the Last Year: 0    Homeless in the Last Year: No  Utilities: Not At Risk (02/09/2024)   Epic    Threatened with loss of utilities: No  Health Literacy: Adequate Health Literacy (02/09/2024)   B1300 Health Literacy    Frequency of need for help with medical instructions: Never     BP (!) 142/72 (BP Location: Left Arm, Patient Position: Sitting, Cuff Size: Large)   Pulse 70   Ht 6' 4 (1.93 m)   Wt 294 lb 12.8 oz (133.7 kg)   SpO2 97%   BMI 35.88 kg/m   Physical Exam:  Well appearing 68 yo man, NAD HEENT: Unremarkable Neck:  No JVD, no thyromegally Lymphatics:  No adenopathy Back:  No CVA tenderness Lungs:  Clear with no wheezes HEART:  IRegular rate rhythm, no murmurs, no rubs, no clicks Abd:  soft, positive bowel sounds, no organomegally, no rebound, no guarding Ext:  2 plus pulses, no edema, no cyanosis, no clubbing Skin:  No rashes no nodules Neuro:  CN II through XII intact, motor grossly intact  EKG  -NSR  with PVC's  DEVICE  Normal device function.  See PaceArt for details.   Assess/Plan:  PAF - he is maintaining NSR very nicely and will continue his beta blocker and systemic anti-coag. Remote pulmonary embolism - continue warfarin which will be lifelong. HTN - his bp is up a little. I asked him to stop amlodipine  and  uptitrate the losartan  to 100 mg daily. He will get a bmp in 2 weeks. Peripheral edema - likely multifactorial. Continue diuretic.    Danelle Merrisa Skorupski,MD    [1]  Allergies Allergen Reactions   Oxycodone Other (See Comments)    Just made me feel bad   "

## 2024-05-19 NOTE — Patient Instructions (Signed)
 Medication Instructions:  Your physician recommends that you continue on your current medications as directed. Please refer to the Current Medication list given to you today.  *If you need a refill on your cardiac medications before your next appointment, please call your pharmacy*  Lab Work: None ordered.  You may go to any [facility] Location for your lab work:  [facility] - [address] ([facility]) - [address] [phone] [address]  [place] - [address]   [place]  - [address]   [place] - [address] - [address] [place]  - [address] [place] - [address] ([facility]   If you have labs (blood work) drawn today and your tests are completely normal, you will receive your results only by: [employer] (if you have MyChart)  If you have any lab test that is abnormal or we need to change your treatment, we will call you or send a MyChart message to review the results.  Testing/Procedures: None ordered.  Follow-Up: At [facility], you and your health needs are our priority.  As part of our continuing mission to provide you with exceptional heart care, we have created designated Provider Care Teams.  These Care Teams include your primary Cardiologist (physician) and Advanced Practice Providers (APPs -  Physician Assistants and Nurse Practitioners) who all work together to provide you with the care you need, when you need it.  We recommend signing up for the patient portal called MyChart.  Sign up information is provided on this After Visit Summary.  MyChart is used to connect with patients for Virtual Visits (Telemedicine).  Patients are able to view lab/test results, encounter notes, upcoming appointments, etc.  Non-urgent messages can be sent to your provider as well.   To learn more about what you can do with MyChart, go to  [email or web address].    Your next appointment:   1 year(s)

## 2024-06-03 ENCOUNTER — Encounter: Payer: Self-pay | Admitting: Internal Medicine

## 2024-06-18 ENCOUNTER — Ambulatory Visit

## 2024-06-18 DIAGNOSIS — Z7901 Long term (current) use of anticoagulants: Secondary | ICD-10-CM | POA: Diagnosis not present

## 2024-06-18 LAB — POCT INR: INR: 3.3 — AB (ref 2.0–3.0)

## 2024-06-18 NOTE — Progress Notes (Signed)
 Indication: DVT, PE Hold warfarin today and then continue  2 tablets daily except take 1 tablet on Mondays and Fridays. Recheck in 3 weeks.

## 2024-06-18 NOTE — Patient Instructions (Addendum)
 Pre visit review using our clinic review tool, if applicable. No additional management support is needed unless otherwise documented below in the visit note.  Hold warfarin today and then continue  2 tablets daily except take 1 tablet on Mondays and Fridays. Recheck in 3 weeks.

## 2024-07-09 ENCOUNTER — Ambulatory Visit
